# Patient Record
Sex: Female | Born: 1975 | Race: Black or African American | Hispanic: No | Marital: Single | Smoking: Former smoker
Health system: Southern US, Community
[De-identification: ages and names within clinical notes are randomized; demographics above are authoritative.]

## PROBLEM LIST (undated history)

## (undated) ENCOUNTER — Ambulatory Visit: Payer: MEDICAID

## (undated) DIAGNOSIS — C499 Malignant neoplasm of connective and soft tissue, unspecified: Secondary | ICD-10-CM

## (undated) DIAGNOSIS — F419 Anxiety disorder, unspecified: Secondary | ICD-10-CM

## (undated) DIAGNOSIS — I999 Unspecified disorder of circulatory system: Secondary | ICD-10-CM

## (undated) DIAGNOSIS — I7300 Raynaud's syndrome without gangrene: Secondary | ICD-10-CM

## (undated) DIAGNOSIS — I10 Essential (primary) hypertension: Secondary | ICD-10-CM

## (undated) DIAGNOSIS — F4310 Post-traumatic stress disorder, unspecified: Secondary | ICD-10-CM

## (undated) DIAGNOSIS — E039 Hypothyroidism, unspecified: Secondary | ICD-10-CM

## (undated) DIAGNOSIS — Z87410 Personal history of cervical dysplasia: Secondary | ICD-10-CM

## (undated) HISTORY — PX: LYMPH NODE BIOPSY: SHX201

## (undated) HISTORY — PX: APPENDECTOMY: SHX54

## (undated) HISTORY — DX: Personal history of cervical dysplasia: Z87.410

---

## 1998-11-12 ENCOUNTER — Other Ambulatory Visit: Admission: RE | Admit: 1998-11-12 | Discharge: 1998-11-12 | Payer: Self-pay | Admitting: *Deleted

## 1999-01-14 ENCOUNTER — Other Ambulatory Visit: Admission: RE | Admit: 1999-01-14 | Discharge: 1999-01-14 | Payer: Self-pay | Admitting: *Deleted

## 1999-01-17 ENCOUNTER — Other Ambulatory Visit: Admission: RE | Admit: 1999-01-17 | Discharge: 1999-01-17 | Payer: Self-pay | Admitting: *Deleted

## 1999-01-17 ENCOUNTER — Encounter (INDEPENDENT_AMBULATORY_CARE_PROVIDER_SITE_OTHER): Payer: Self-pay | Admitting: *Deleted

## 1999-02-25 ENCOUNTER — Other Ambulatory Visit: Admission: RE | Admit: 1999-02-25 | Discharge: 1999-02-25 | Payer: Self-pay | Admitting: *Deleted

## 1999-02-25 ENCOUNTER — Encounter (INDEPENDENT_AMBULATORY_CARE_PROVIDER_SITE_OTHER): Payer: Self-pay | Admitting: Specialist

## 1999-11-02 ENCOUNTER — Other Ambulatory Visit: Admission: RE | Admit: 1999-11-02 | Discharge: 1999-11-02 | Payer: Self-pay | Admitting: Obstetrics and Gynecology

## 2003-05-31 ENCOUNTER — Emergency Department (HOSPITAL_COMMUNITY): Admission: AD | Admit: 2003-05-31 | Discharge: 2003-05-31 | Payer: Self-pay | Admitting: Family Medicine

## 2004-07-20 ENCOUNTER — Emergency Department (HOSPITAL_COMMUNITY): Admission: EM | Admit: 2004-07-20 | Discharge: 2004-07-20 | Payer: Self-pay | Admitting: Family Medicine

## 2004-07-20 ENCOUNTER — Ambulatory Visit (HOSPITAL_COMMUNITY): Admission: RE | Admit: 2004-07-20 | Discharge: 2004-07-20 | Payer: Self-pay | Admitting: Family Medicine

## 2004-07-20 IMAGING — US US OB COMP LESS 14 WK
1 series · 14 of 28 positions shown · non-contrast
Comparison: none

CLINICAL DATA: Pelvic pain/positive pregnancy test.
 OB ULTRASOUND LESS THAN 14 WEEKS WITH TRANSVAGINAL SCANNING:
 There is an intrauterine gestational sac consistent with a 5 week 5 day IUP.  Yolk sac is identified.  No embryo is visualized at this time.  Ovaries normal.  No adnexal masses.  No free fluid in the pelvis.

[Series 1: unknown · 0.32mm/px · 14 of 59 slices shown]
[im 3/59]
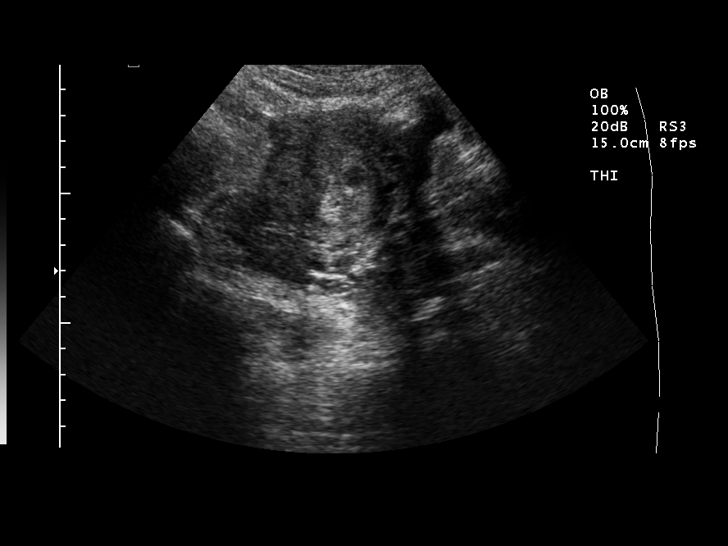
[im 7/59]
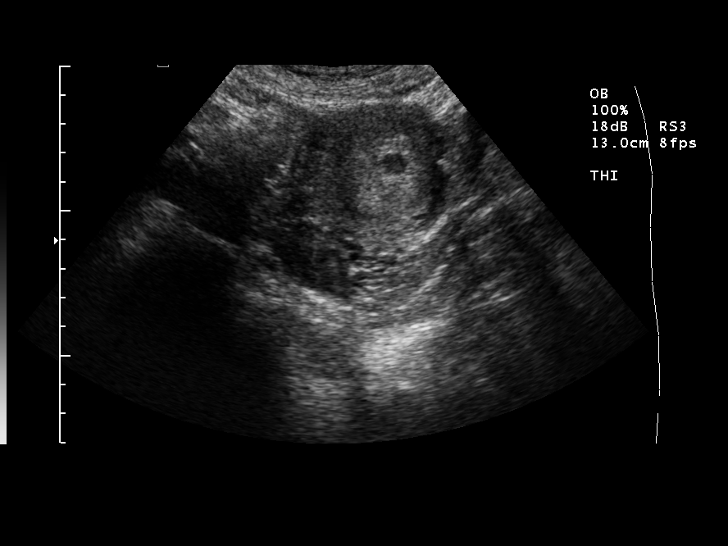
[im 11/59]
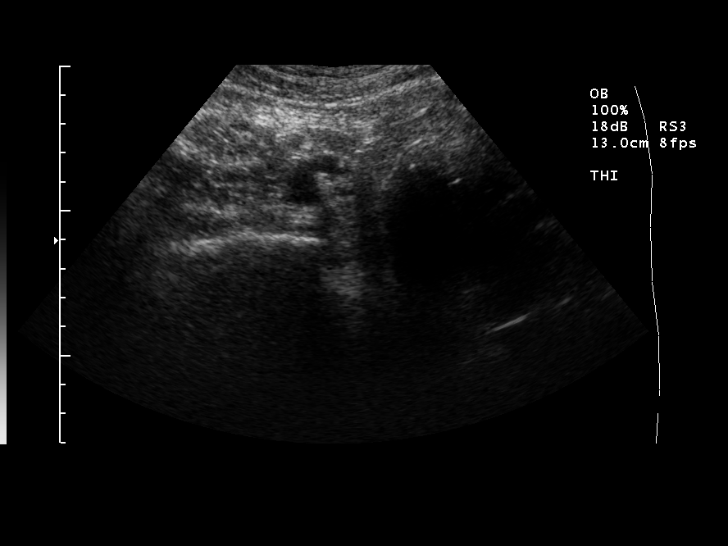
[im 16/59]
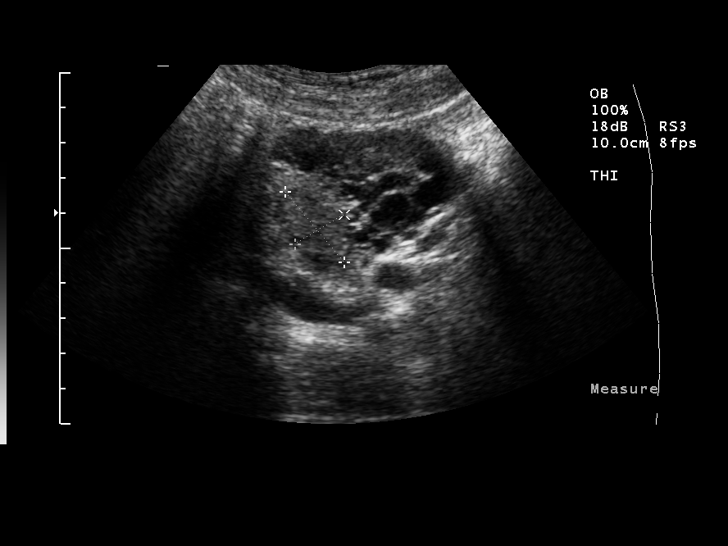
[im 20/59]
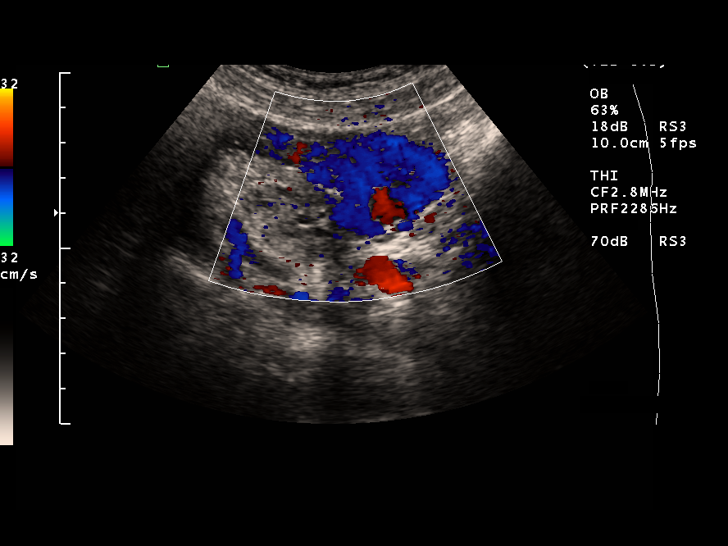
[im 24/59]
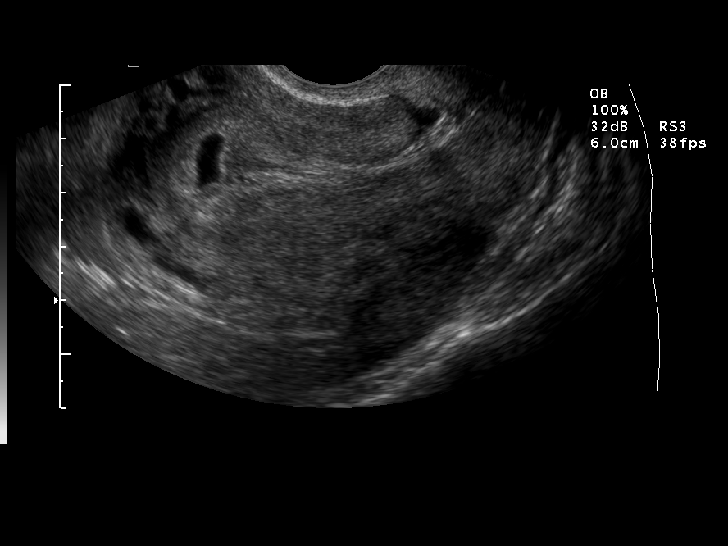
[im 28/59]
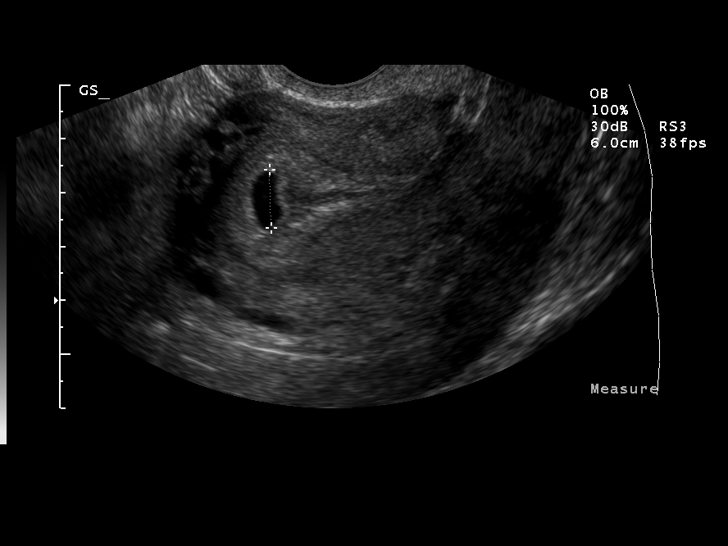
[im 33/59]
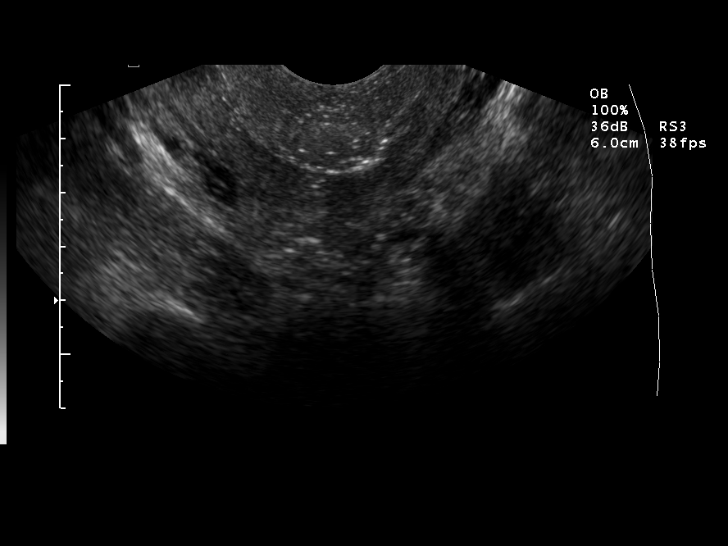
[im 37/59]
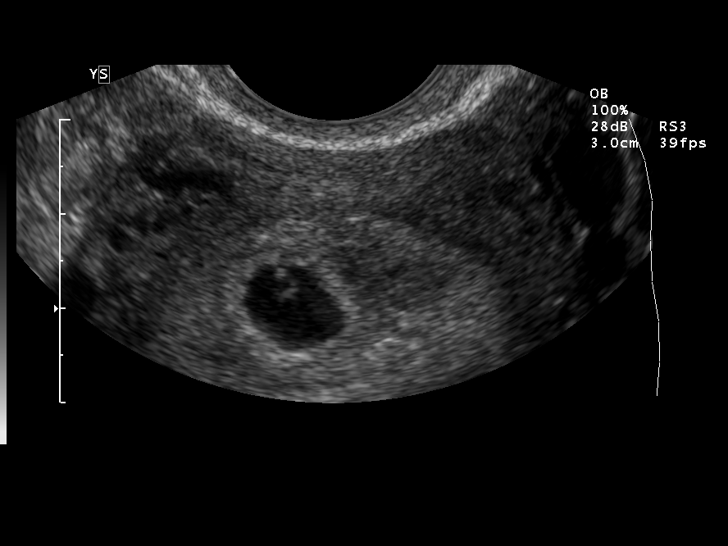
[im 41/59]
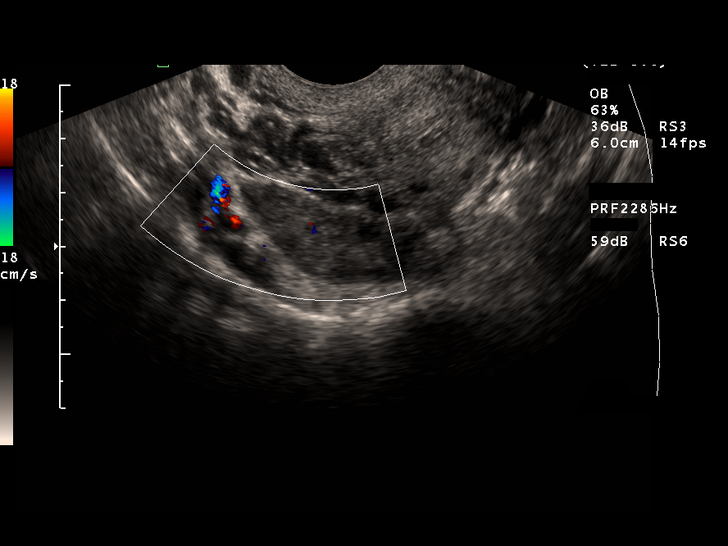
[im 46/59]
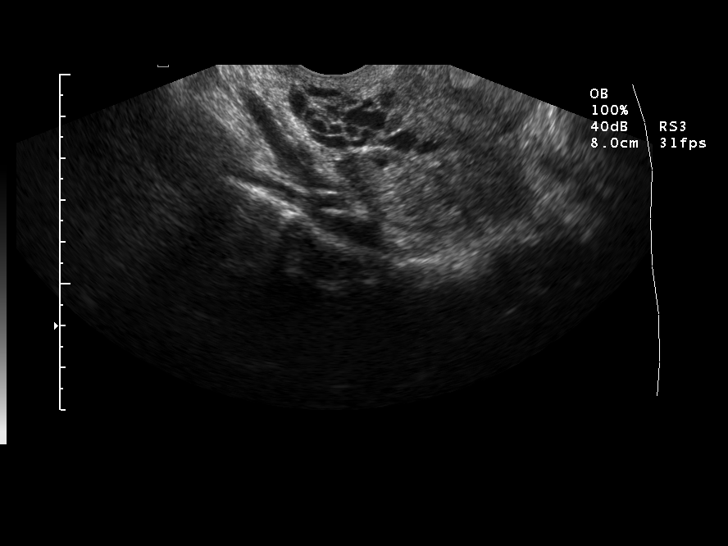
[im 50/59]
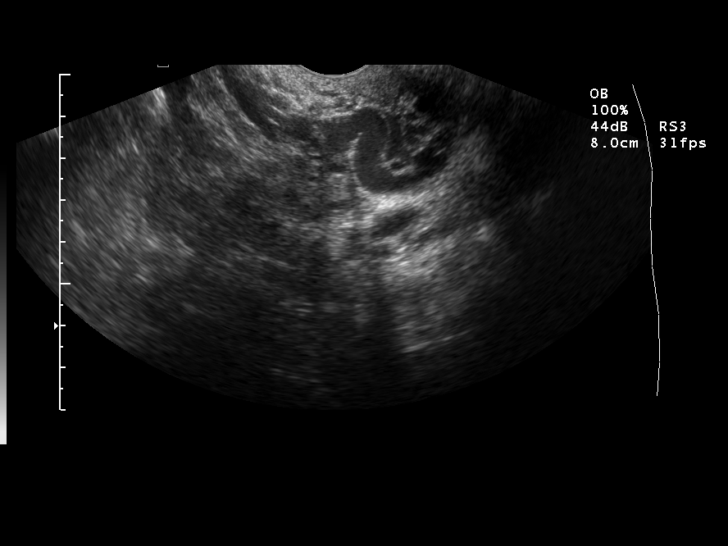
[im 54/59]
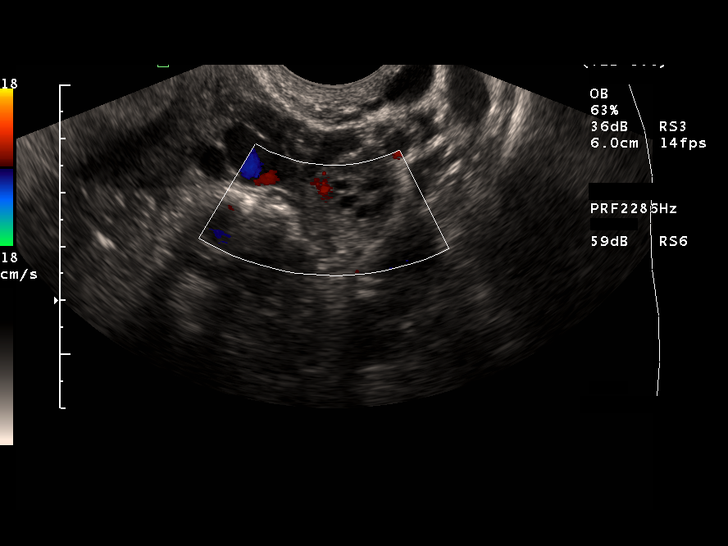
[im 59/59]
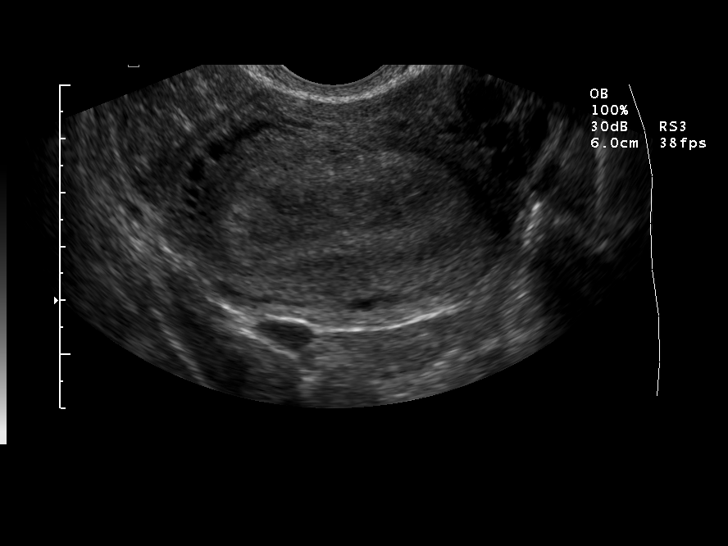

[14 of 28 positions shown; findings below may reference images not displayed]

IMPRESSION: Intrauterine gestational sac.  Gestational age estimated at 5 weeks 5 days based on sac size, but at this time, there is no embryo identified.  See report.

## 2004-07-26 ENCOUNTER — Inpatient Hospital Stay (HOSPITAL_COMMUNITY): Admission: AD | Admit: 2004-07-26 | Discharge: 2004-07-26 | Payer: Self-pay

## 2004-07-26 IMAGING — US US OB TRANSVAGINAL MODIFY
1 series · 14 of 28 positions shown · non-contrast
Comparison: [DATE].

CLINICAL DATA: Early pregnancy with heavy vaginal bleeding and cramping.

COMPLETE OBSTETRICAL ULTRASOUND LESS THAN 14 WEEKS AND TRANSVAGINAL OBSTETRICAL
ULTRASOUND

[Series 1: us ob transvaginal modify · 0.33mm/px · 44 acquisitions, 14 frames shown]
[im 2/44]
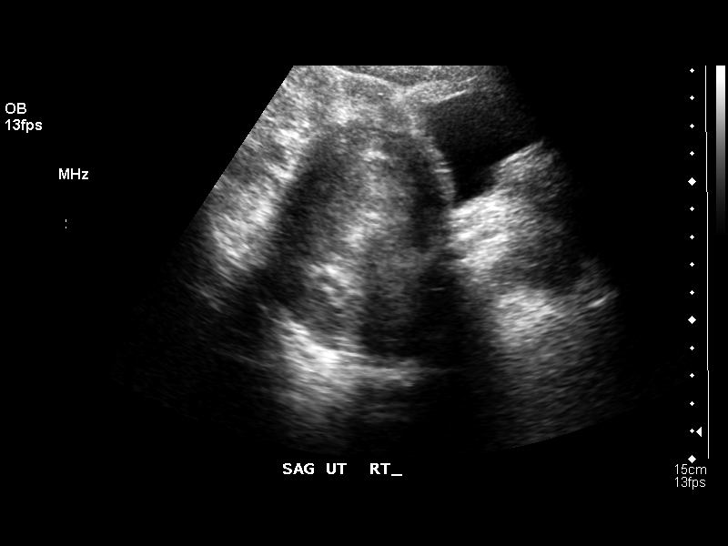
[im 5/44]
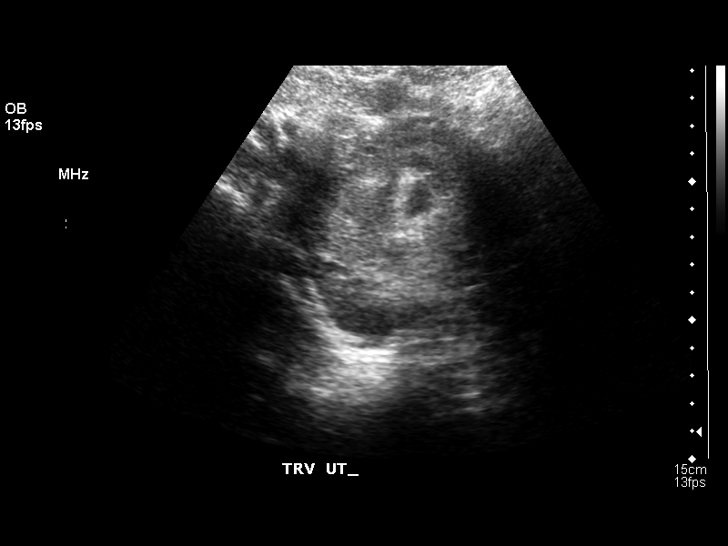
[im 8/44]
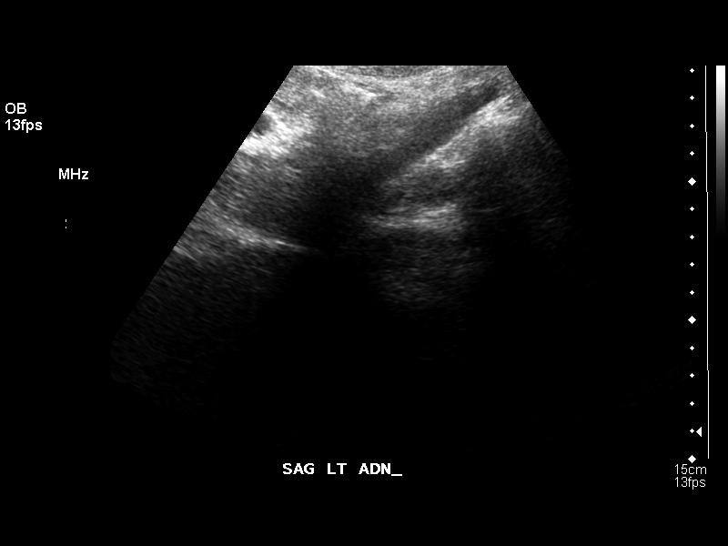
[im 12/44]
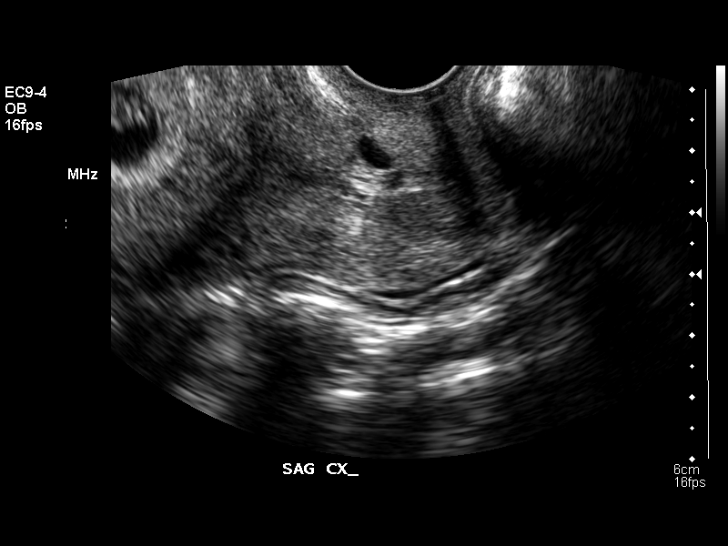
[im 15/44]
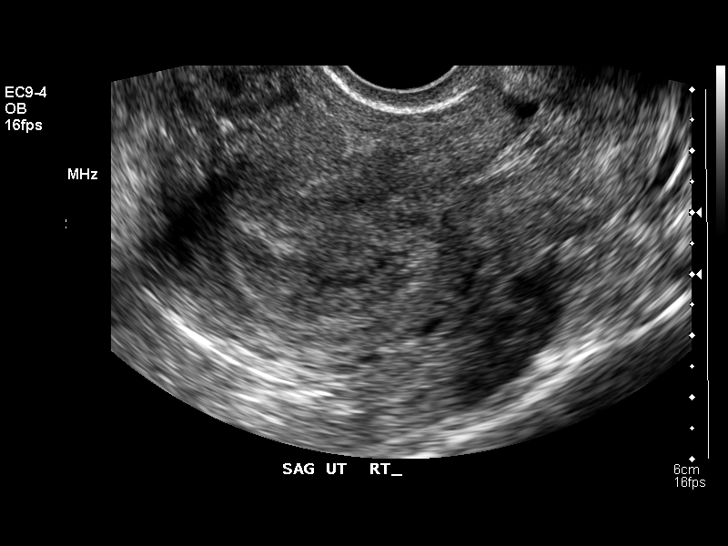
[im 18/44]
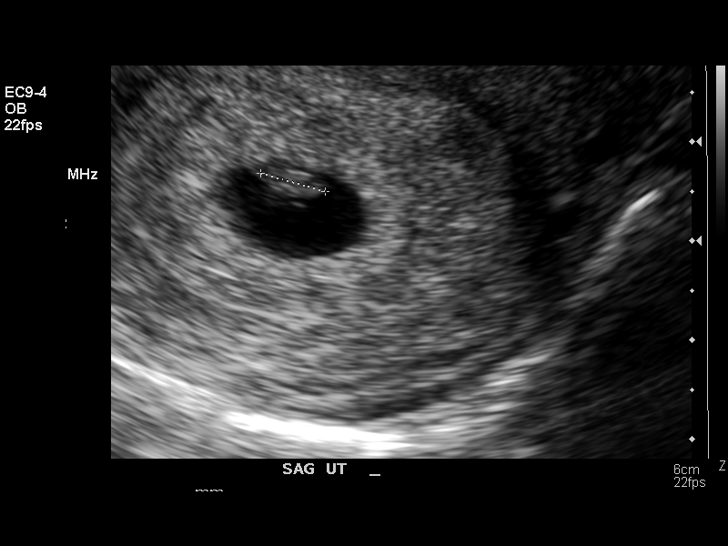
[im 21/44]
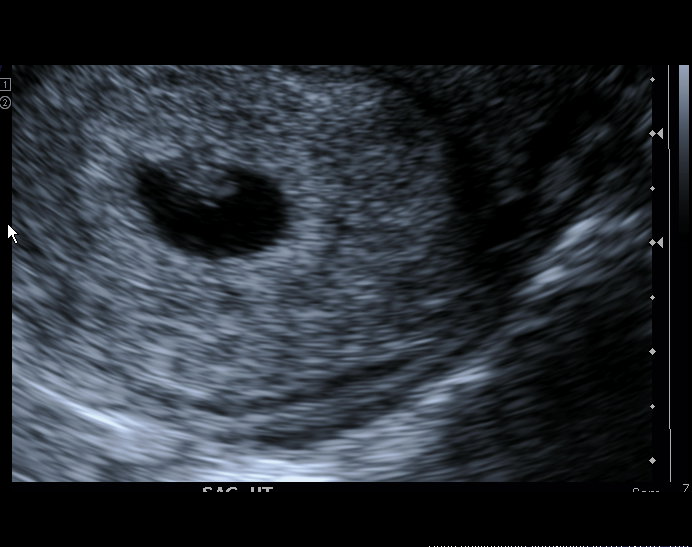
[im 24/44]
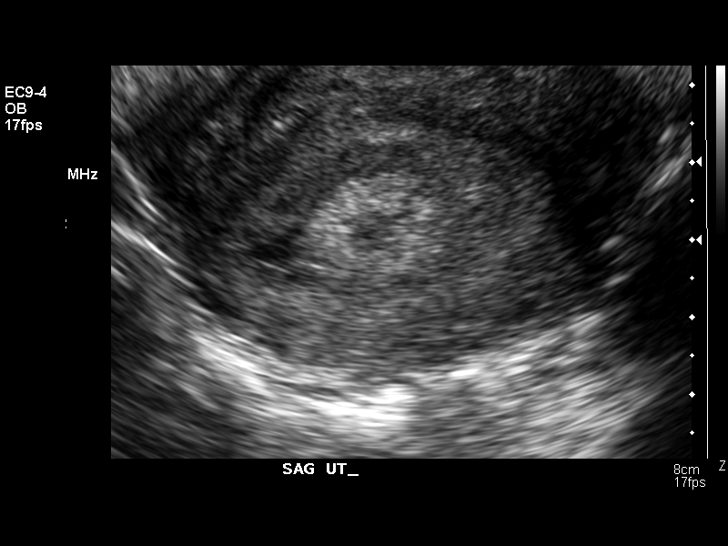
[im 28/44]
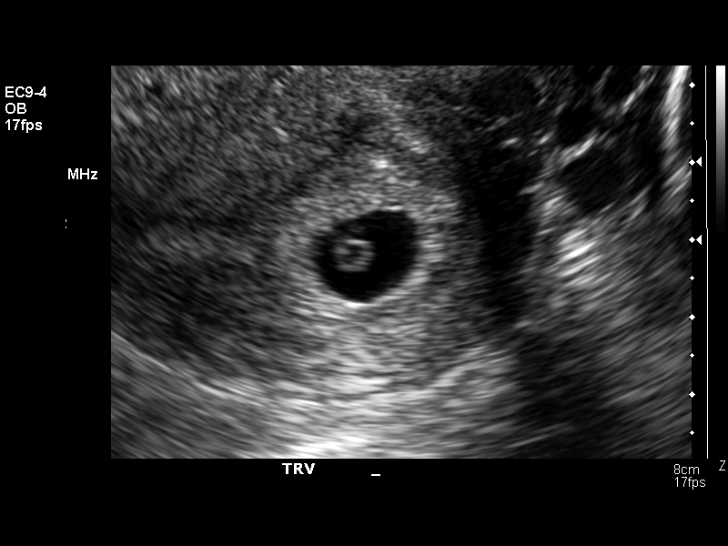
[im 31/44]
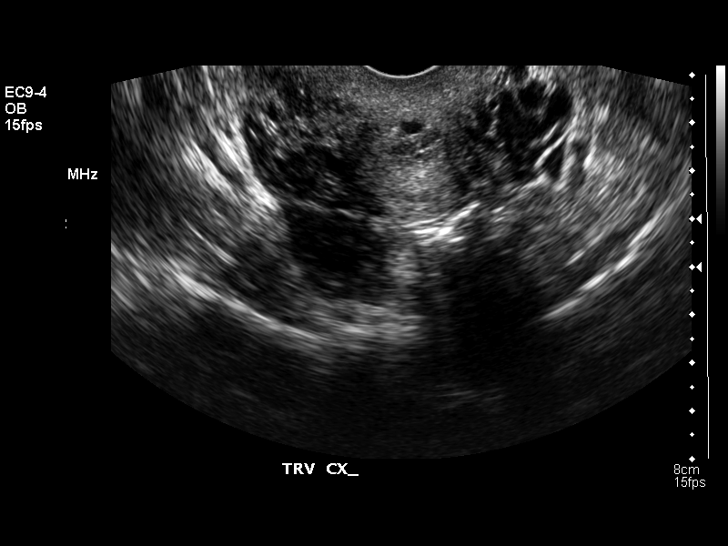
[im 34/44]
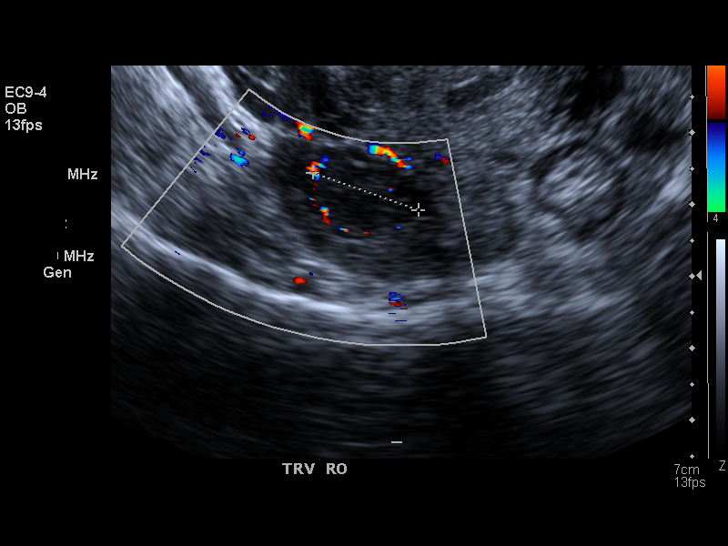
[im 37/44]
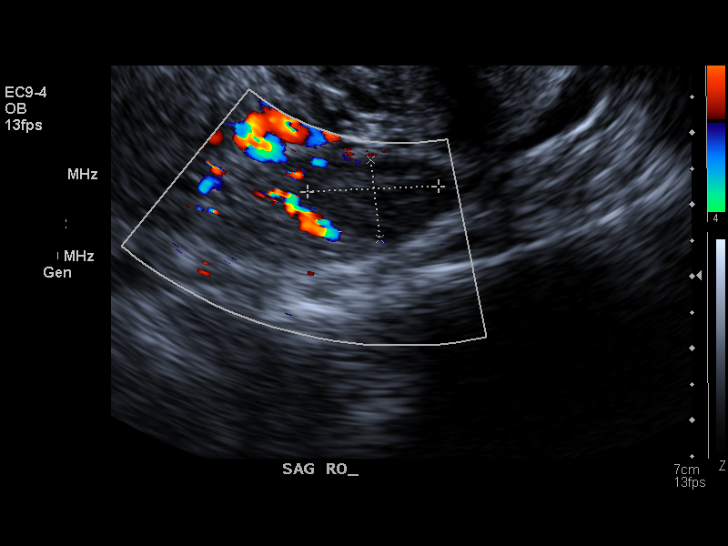
[im 40/44]
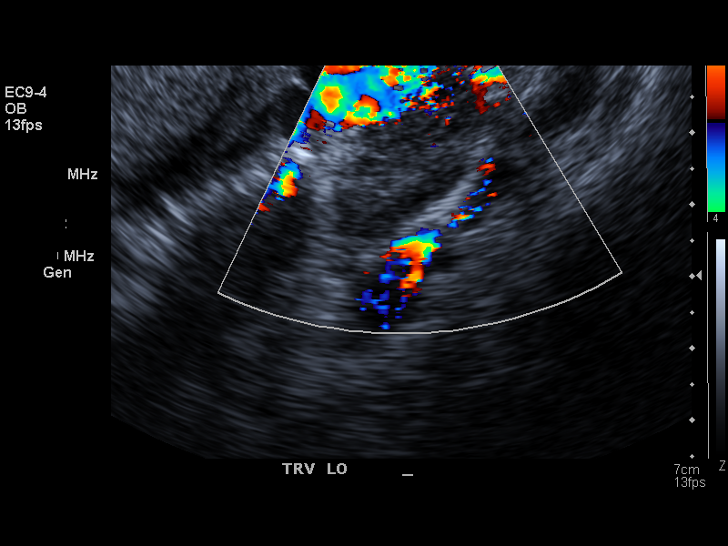
[im 44/44]
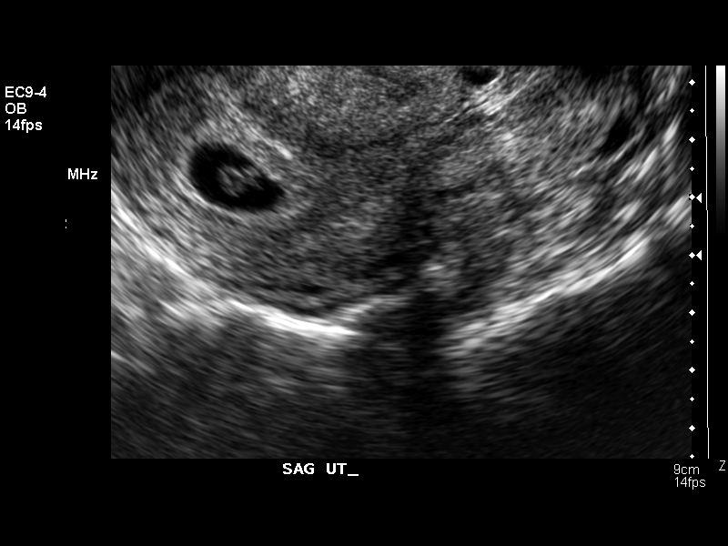

[14 of 28 positions shown; findings below may reference images not displayed]

FINDINGS: Single live intrauterine gestation with a crown-rump length of 7 mm,
giving an estimated gestational age of 6 weeks and 4 days. Normal fetal cardiac
activity with a fetal heart rate of 141 beats per minute. Normal appearing yolk
sac. Small subchorionic hemorrhage measuring 10 x 6 x 5 mm in maximum
dimensions. 1.8 x 1.6 x 1.1 cm oval hypoechoic area within the right ovary
without internal blood flow with color Doppler. Normal appearing left ovary.
Small amount of free peritoneal fluid.

IMPRESSION

1. Single live intrauterine gestation with an estimated gestational age of 6
weeks and 4 days.

2. Small subchorionic hemorrhage.

3. 1.8 cm resolving right ovarian corpus luteum cyst.

4. Small amount of free peritoneal fluid.

## 2004-09-29 ENCOUNTER — Other Ambulatory Visit: Admission: RE | Admit: 2004-09-29 | Discharge: 2004-09-29 | Payer: Self-pay | Admitting: Obstetrics and Gynecology

## 2005-02-01 ENCOUNTER — Inpatient Hospital Stay (HOSPITAL_COMMUNITY): Admission: AD | Admit: 2005-02-01 | Discharge: 2005-02-01 | Payer: Self-pay | Admitting: Obstetrics and Gynecology

## 2005-02-23 ENCOUNTER — Inpatient Hospital Stay (HOSPITAL_COMMUNITY): Admission: AD | Admit: 2005-02-23 | Discharge: 2005-02-23 | Payer: Self-pay | Admitting: Obstetrics and Gynecology

## 2005-03-05 ENCOUNTER — Inpatient Hospital Stay (HOSPITAL_COMMUNITY): Admission: AD | Admit: 2005-03-05 | Discharge: 2005-03-09 | Payer: Self-pay | Admitting: Obstetrics and Gynecology

## 2005-03-05 ENCOUNTER — Encounter (INDEPENDENT_AMBULATORY_CARE_PROVIDER_SITE_OTHER): Payer: Self-pay | Admitting: *Deleted

## 2006-04-11 ENCOUNTER — Emergency Department (HOSPITAL_COMMUNITY): Admission: EM | Admit: 2006-04-11 | Discharge: 2006-04-11 | Payer: Self-pay | Admitting: Emergency Medicine

## 2006-04-19 IMAGING — CR DG HAND COMPLETE 3+V*R*
3 series · 3 of 3 positions shown · non-contrast
Comparison: None.

CLINICAL DATA: Cut right pointer finger on glass.  Pain 3rd joint.  
 RIGHT HAND - 3 VIEW:

[view not recorded (1 of 3)]
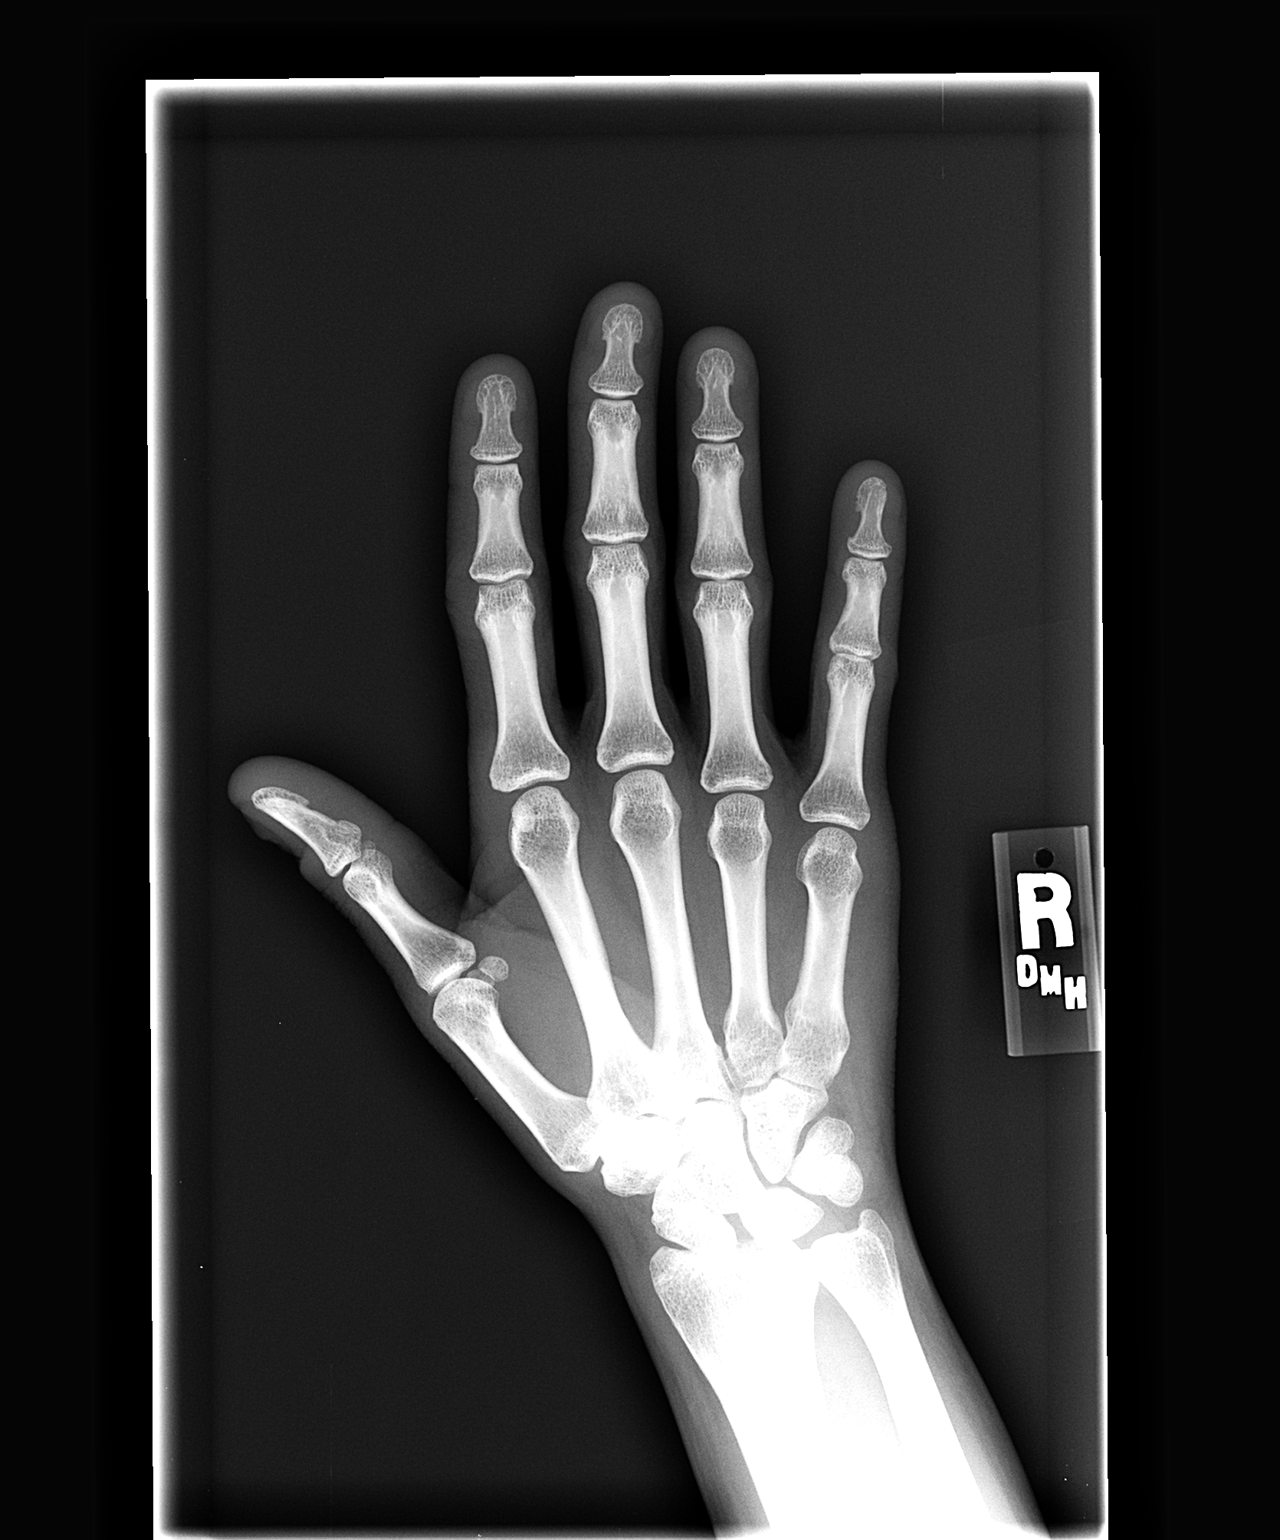

[view not recorded (2 of 3)]
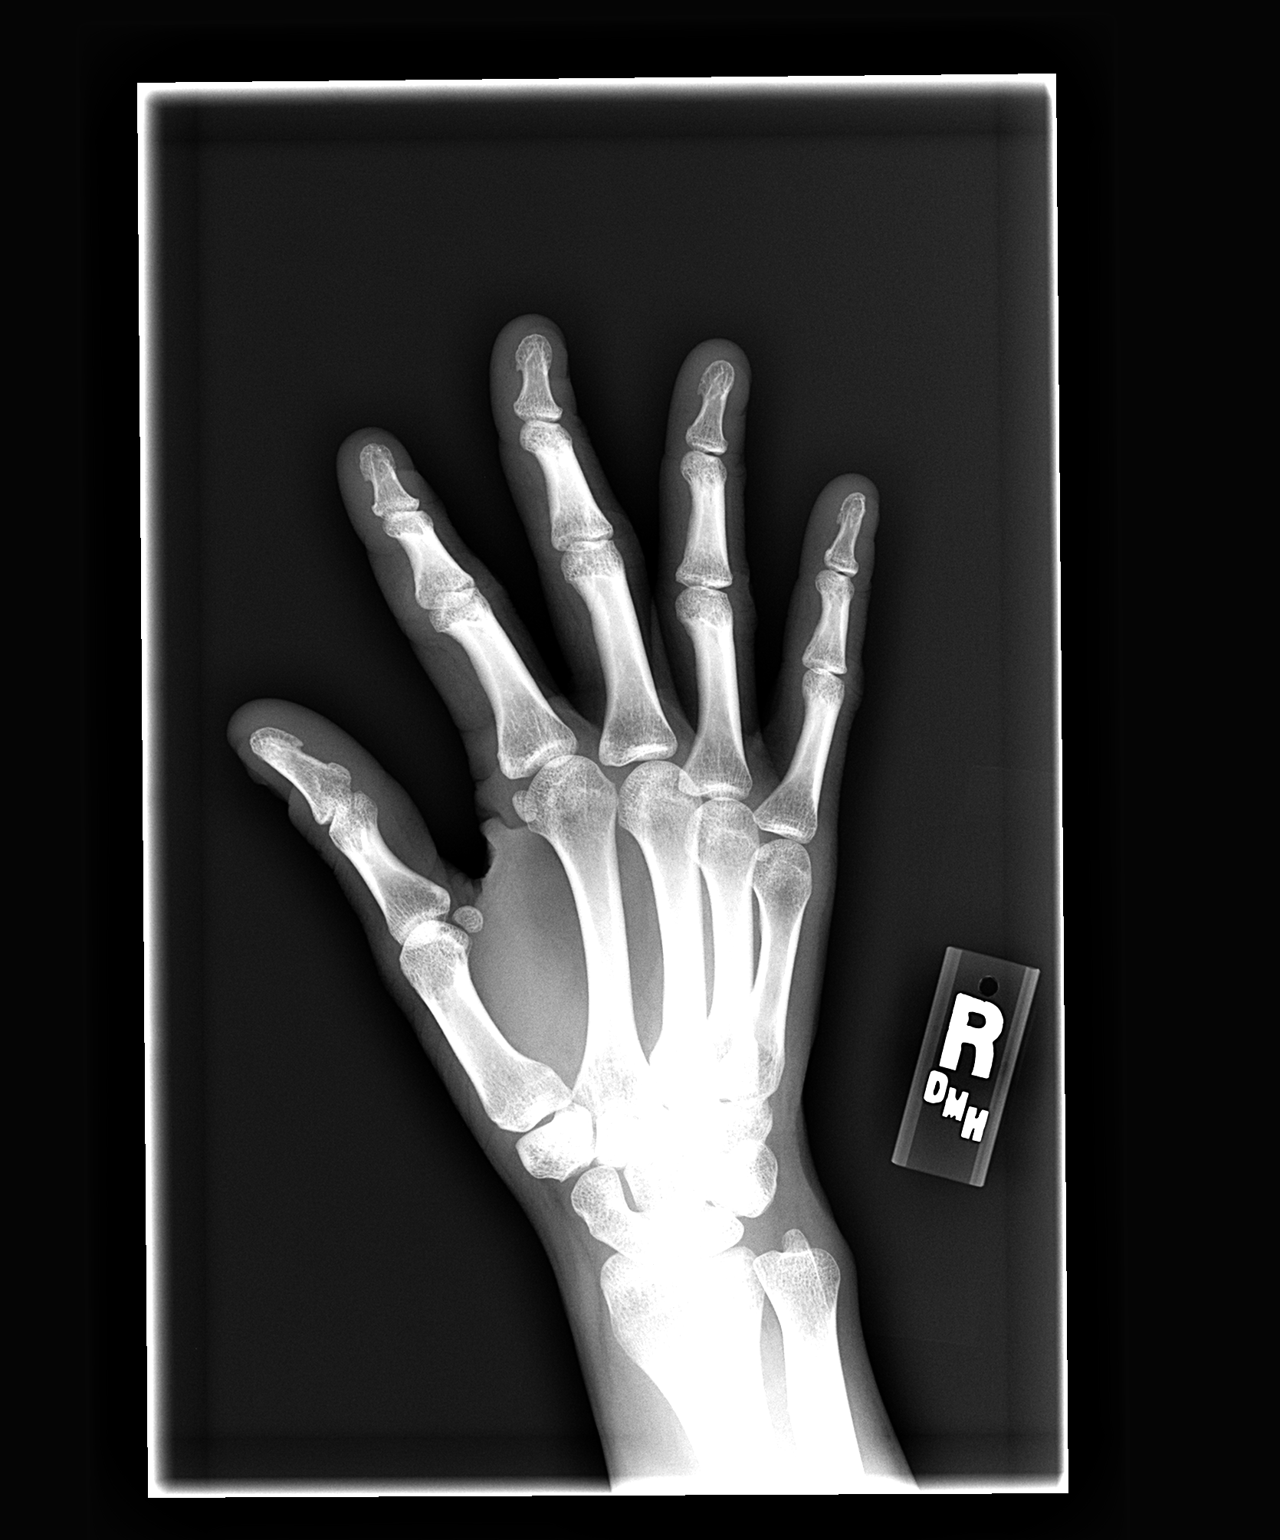

[view not recorded (3 of 3)]
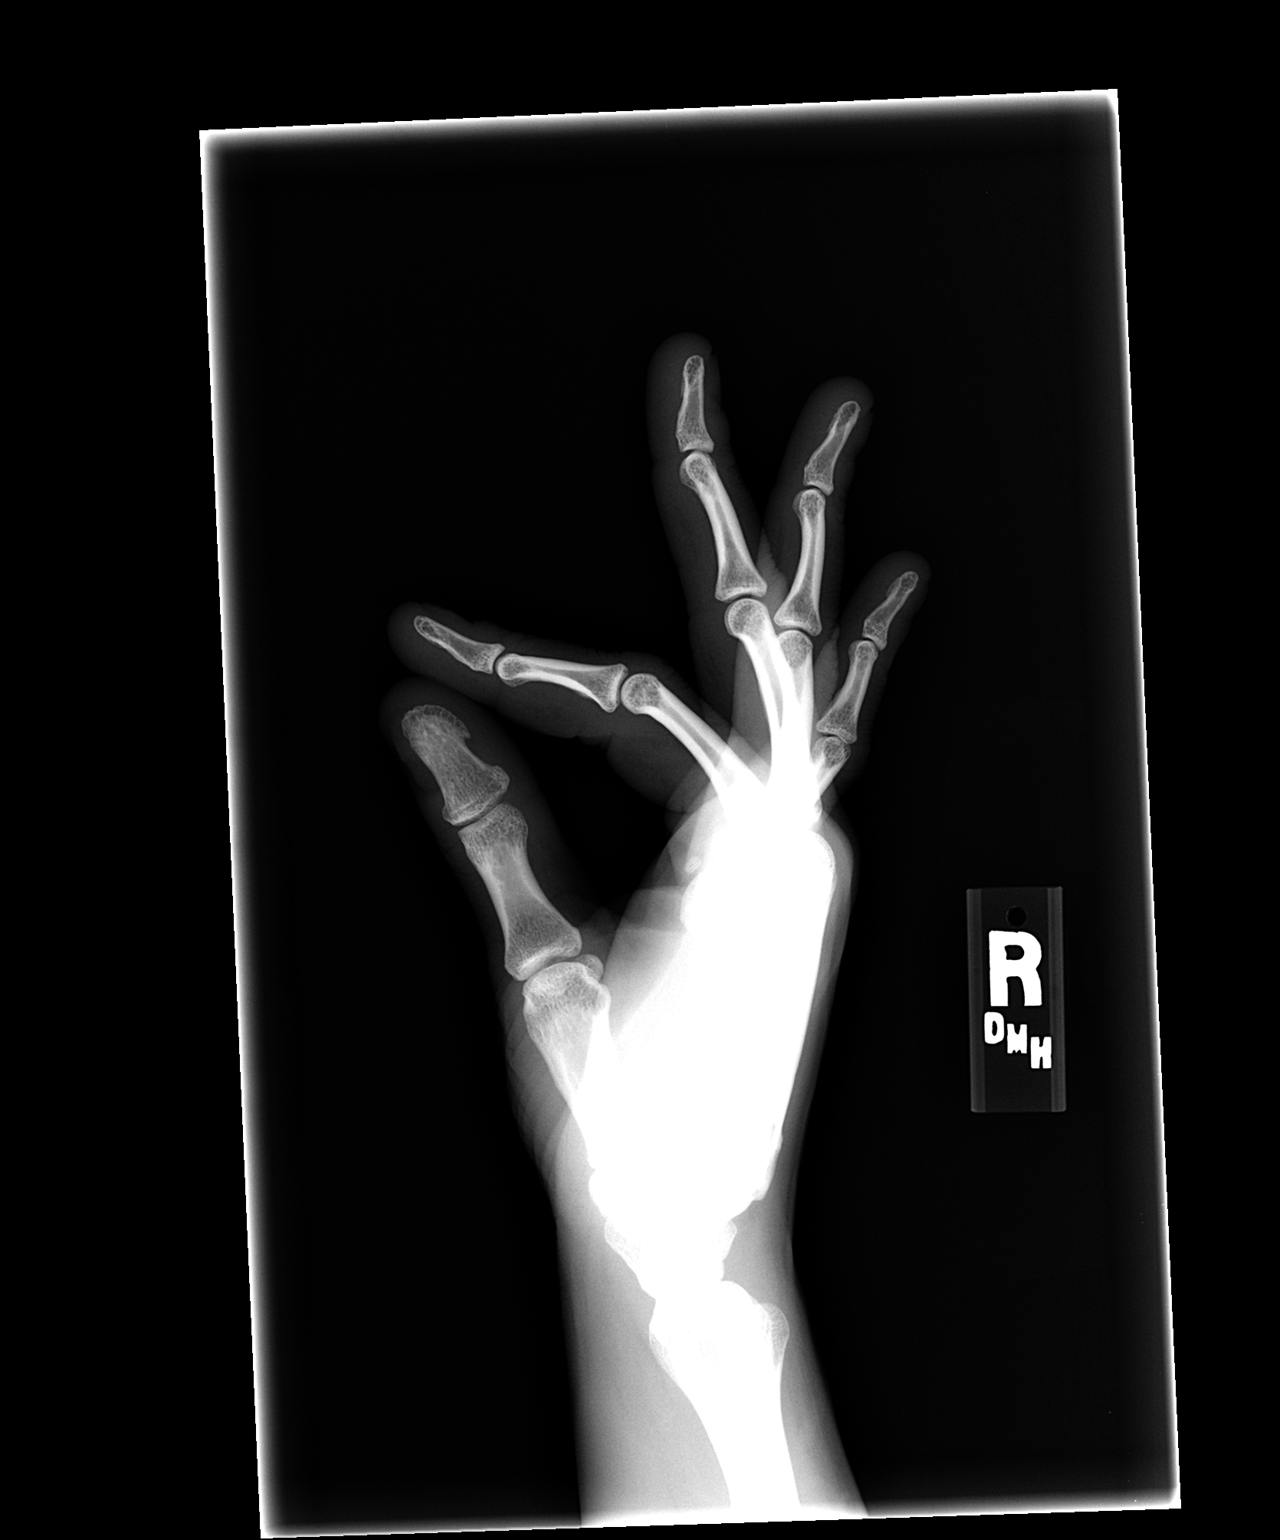

[3 of 3 positions shown; findings below may reference images not displayed]

FINDINGS: There are no radiopaque foreign bodies identified.  The osseous structures are intact.  No fracture or dislocation is identified.
IMPRESSION: 1.  No acute osseous abnormalities. 
 2.  No radiopaque foreign body.

## 2006-05-19 ENCOUNTER — Inpatient Hospital Stay (HOSPITAL_COMMUNITY): Admission: AD | Admit: 2006-05-19 | Discharge: 2006-05-19 | Payer: Self-pay | Admitting: Gynecology

## 2006-05-19 IMAGING — US US TRANSVAGINAL NON-OB
1 series · 13 of 25 positions shown · non-contrast
Comparison: none

CLINICAL DATA: Heavy vaginal bleeding with clots and right lower quadrant and back pain.
TRANSABDOMINAL AND TRANSVAGINAL PELVIC ULTRASOUND ? [DATE]:
TECHNIQUE: Both transabdominal and transvaginal ultrasound examinations of the pelvis were performed including evaluation of the uterus, ovaries, adnexal regions, and pelvic cul-de-sac.

[Series 1: us transvaginal non-ob · 0.37mm/px · 13 of 77 slices shown]
[im 1/77]
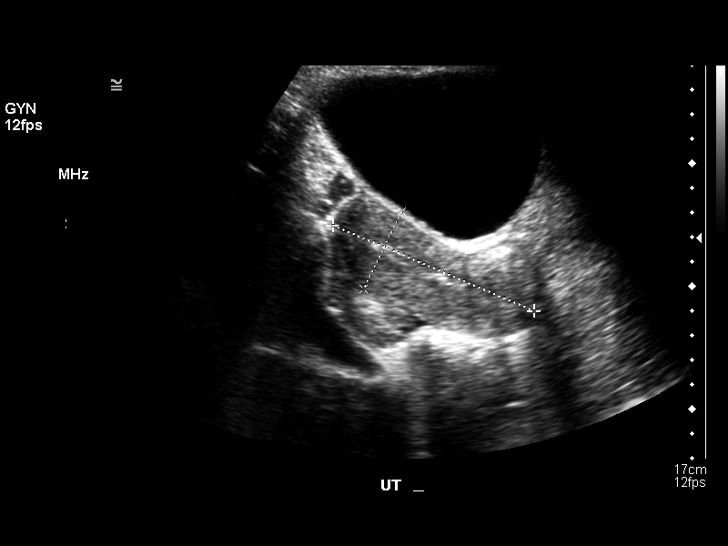
[im 7/77]
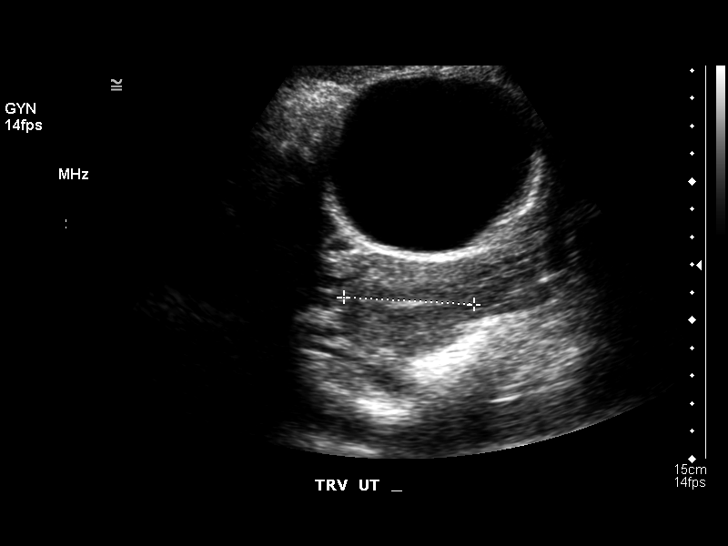
[im 13/77]
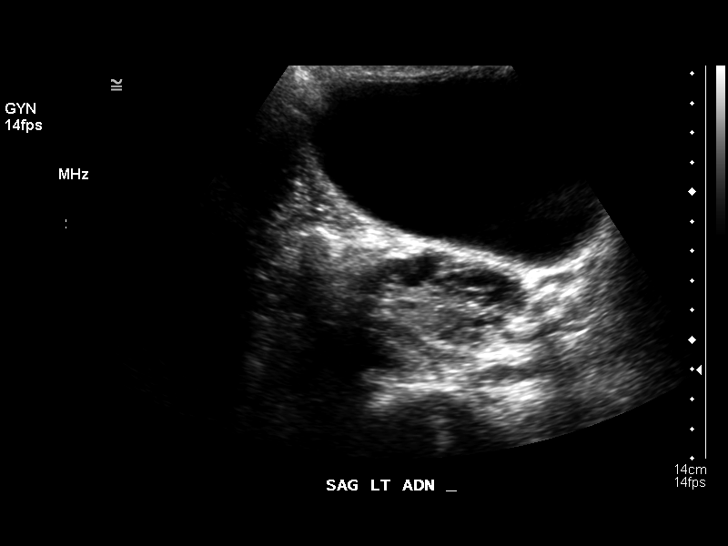
[im 20/77]
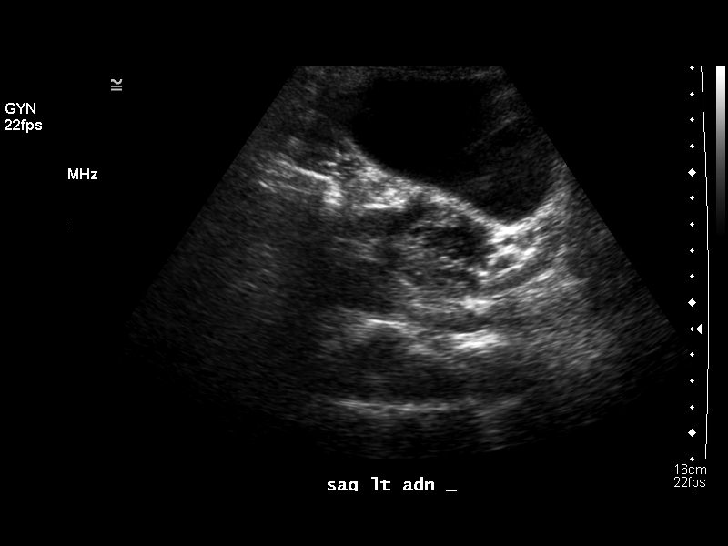
[im 26/77]
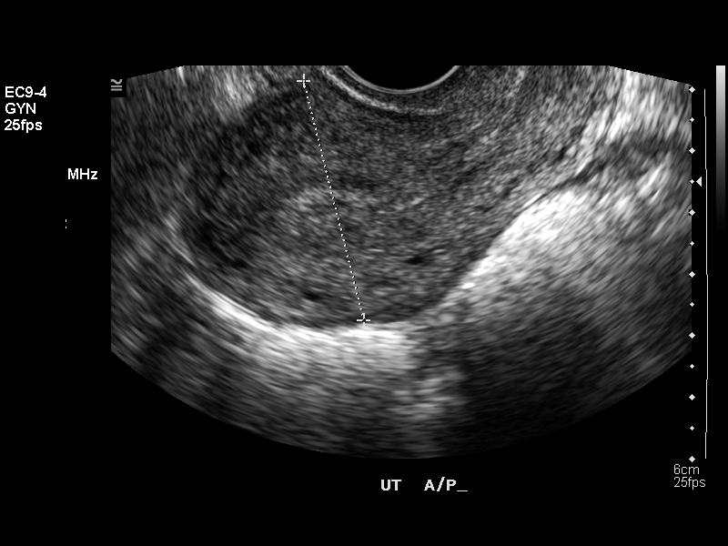
[im 32/77]
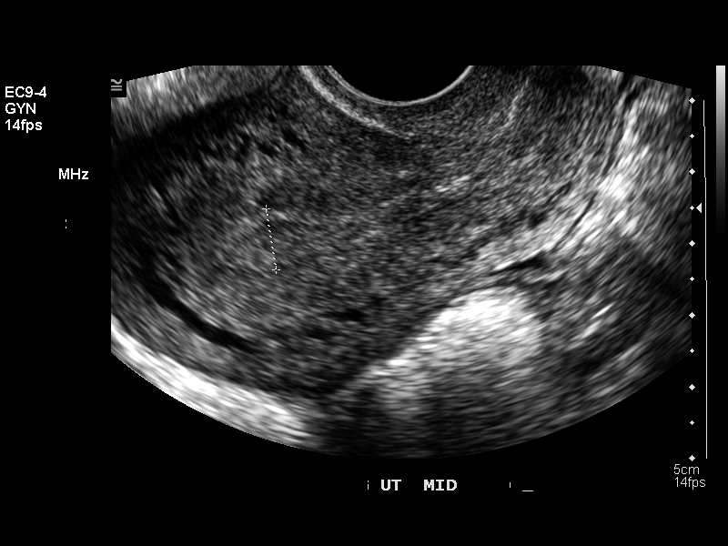
[im 39/77]
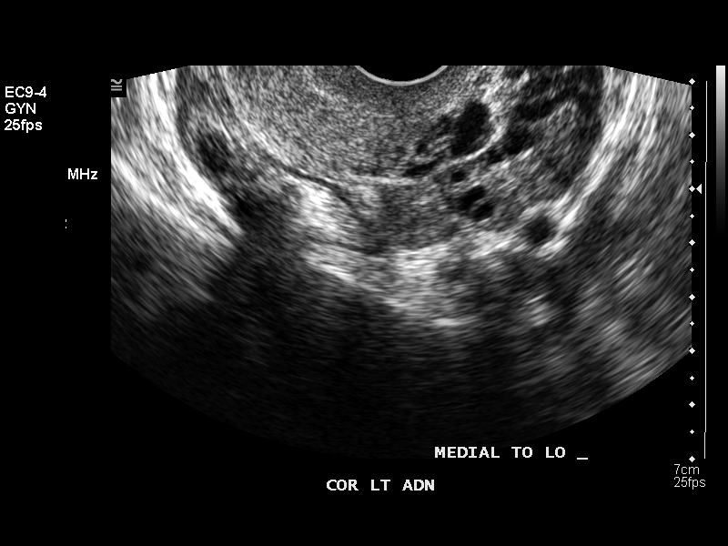
[im 45/77]
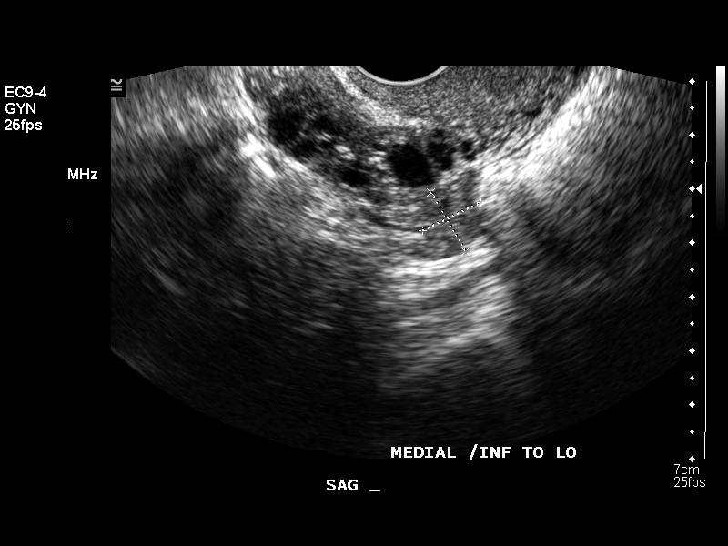
[im 51/77]
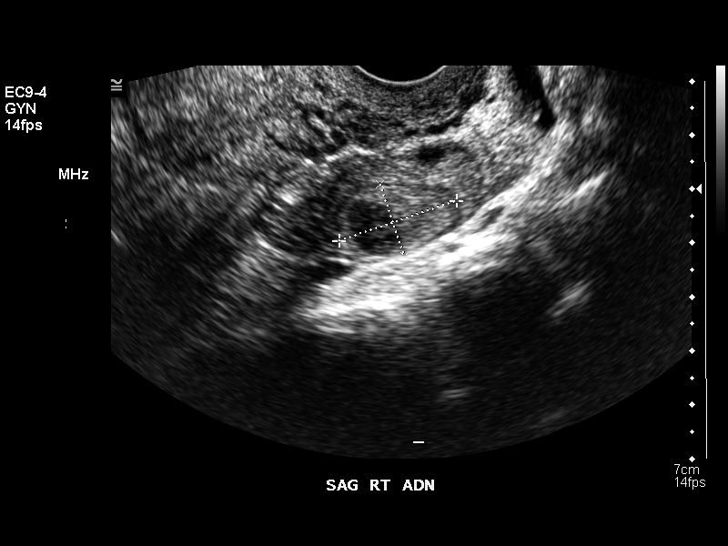
[im 58/77]
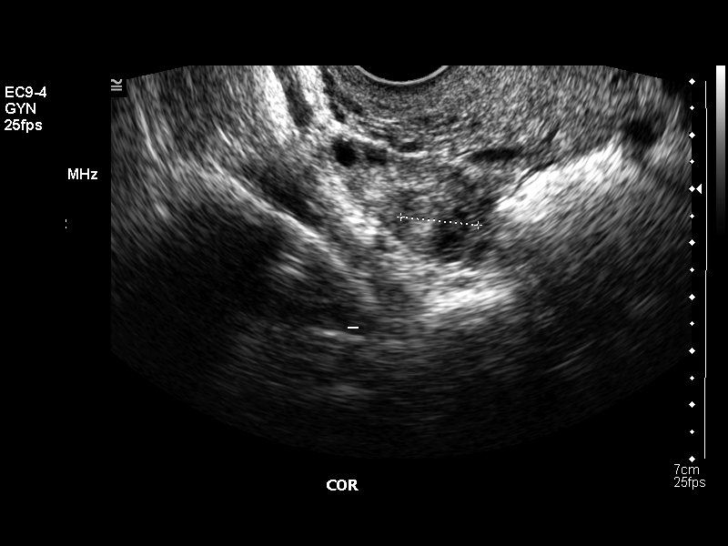
[im 64/77]
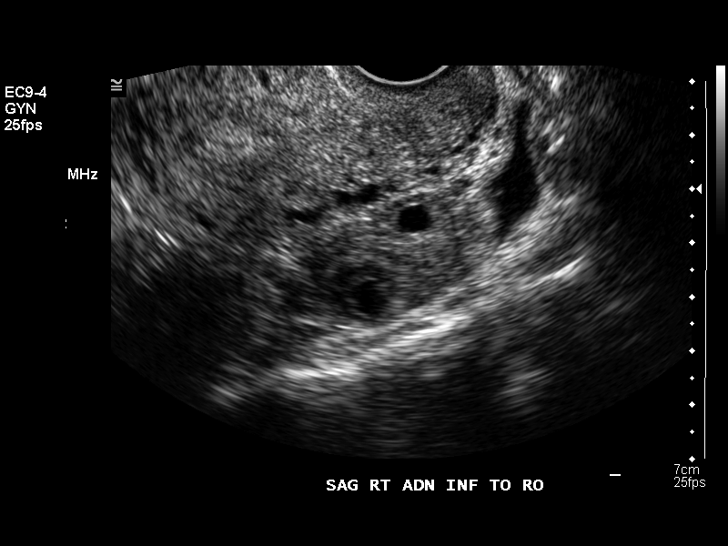
[im 70/77]
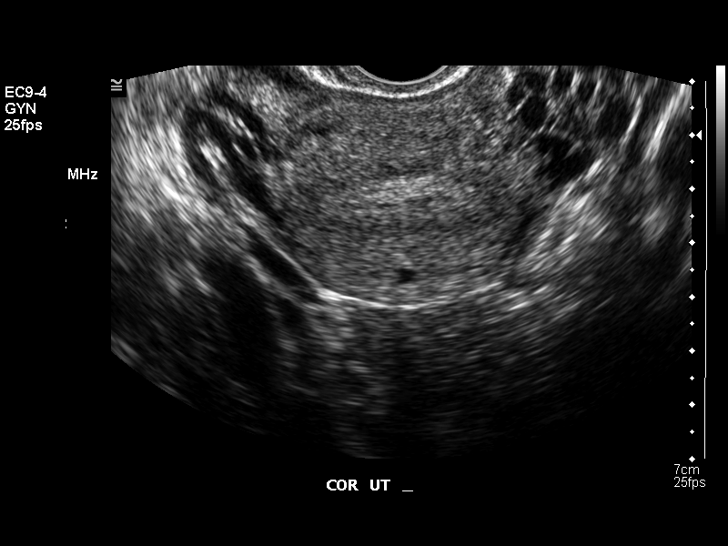
[im 77/77]
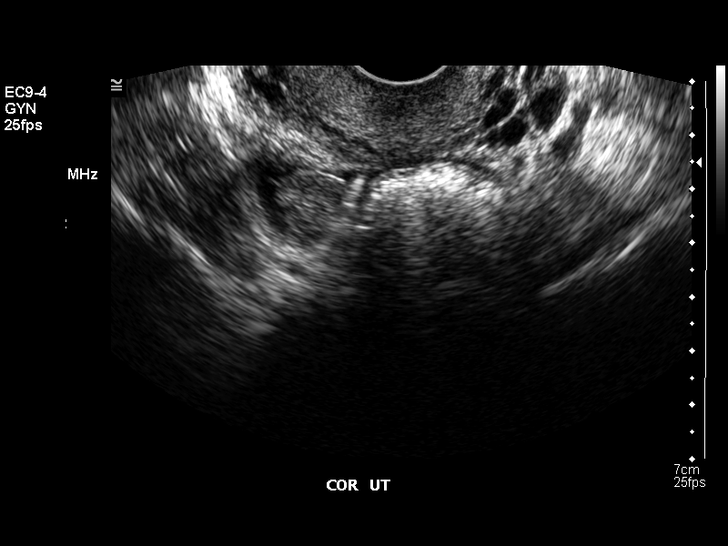

[13 of 25 positions shown; findings below may reference images not displayed]

FINDINGS: The uterus measures 7.8 cm in length x 4.0 x 4.5 cm in transverse dimensions.  An inhomogeneous endometrial complex is noted and could be due to the presence of blood breakdown products.  The endometrial complex is 8.6 mm.  
The right ovary measures 1.8 x 3.3 x 3.8 cm and the left ovary measures 1.6 x 2.8 x 3.0 cm.  The right ovary contains a complex cyst measuring 1.4 x 1.4 x 2.3 cm.  A small to moderate amount of free fluid is noted.  There is a soft tissue area medial to the right ovary measuring 1 x 1.2 x 1.6 cm and also a soft tissue area inferomedial to the left ovary measuring 1.2 x 1.3 x 1.5 cm.  No significant color flow was visualized within these areas.
IMPRESSION: Inhomogeneous endometrial complex, the etiology of which is uncertain.  This may represent blood breakdown products or potentially products of conception. 
Complex cyst in the right ovary.
Indeterminate soft tissue areas adjacent to the right and left ovaries of undetermined etiology.  Ectopic pregnancy cannot be excluded.  No evidence of a well-defined intrauterine or extrauterine gestational sac.  Small to moderate amount of free fluid.

## 2006-07-09 ENCOUNTER — Emergency Department (HOSPITAL_COMMUNITY): Admission: EM | Admit: 2006-07-09 | Discharge: 2006-07-10 | Payer: Self-pay | Admitting: Emergency Medicine

## 2006-07-10 IMAGING — CT CT ABDOMEN W/ CM
2 of 5 series · 17 of 46 positions shown, 19 images · IV contrast (omnipaque)
Comparison: None

ABDOMEN CT WITH CONTRAST

CLINICAL DATA: Abdominal pain, cramping
TECHNIQUE: Multidetector CT imaging of the abdomen and pelvis was performed
following the standard protocol during bolus administration of intravenous
contrast.

Contrast:  100 cc Omnipaque 300

[Series 2: abd_pel 5.0 b40f st · axial · 0.59mm/px · z∈[+788,+1144]mm · 14 of 81 slices shown, 16 images]
[im 5/81  soft-tissue]
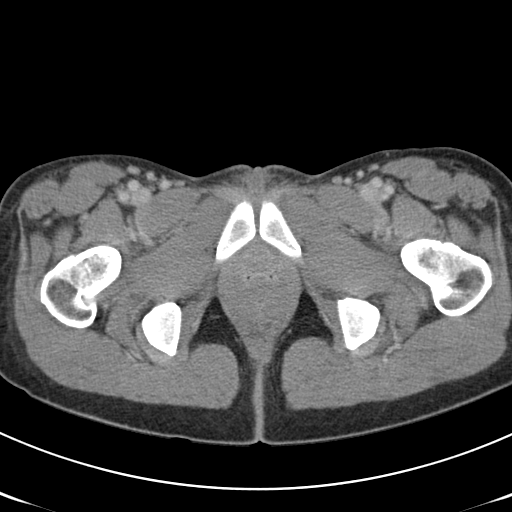
[im 5/81  bone]
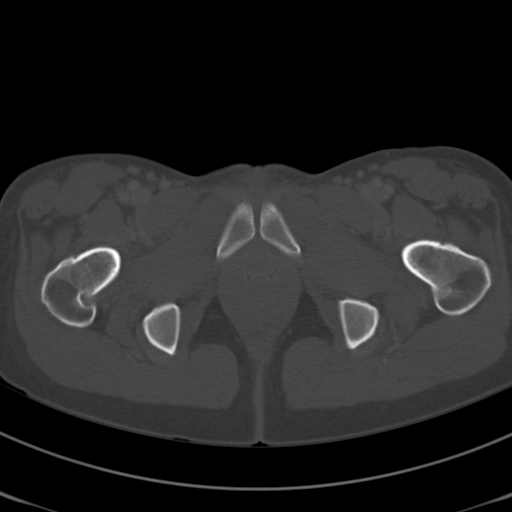
[im 9/81  soft-tissue]
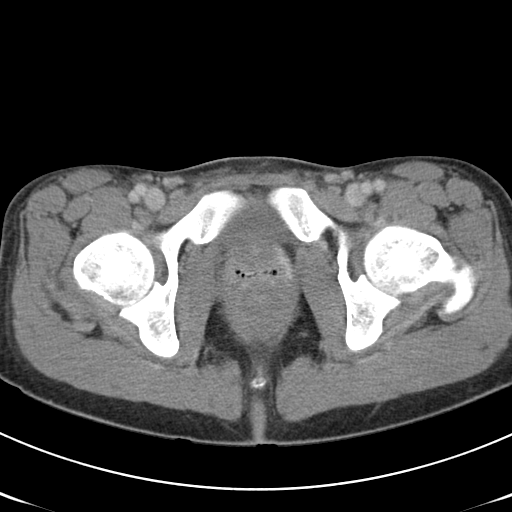
[im 17/81  soft-tissue]
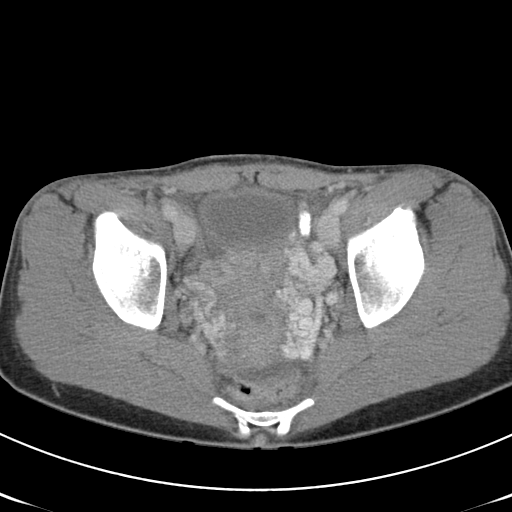
[im 22/81  soft-tissue]
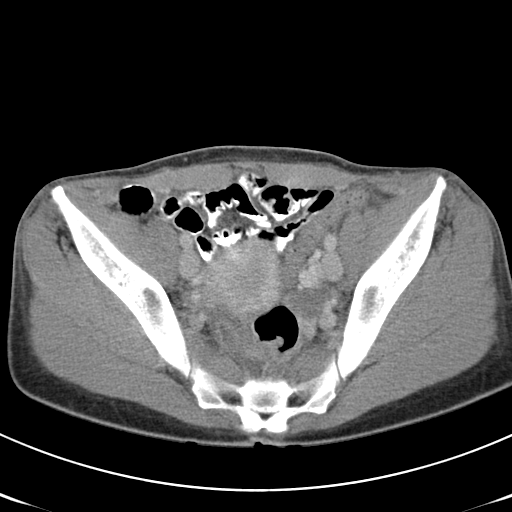
[im 26/81  soft-tissue]
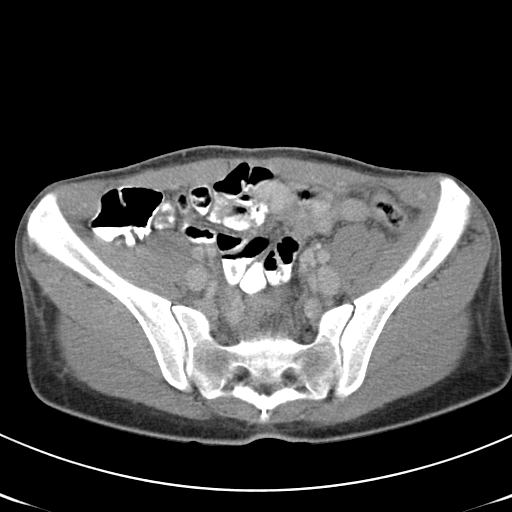
[im 34/81  soft-tissue]
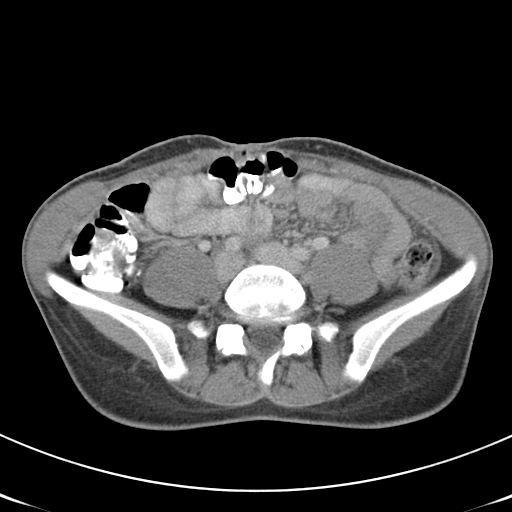
[im 38/81  soft-tissue]
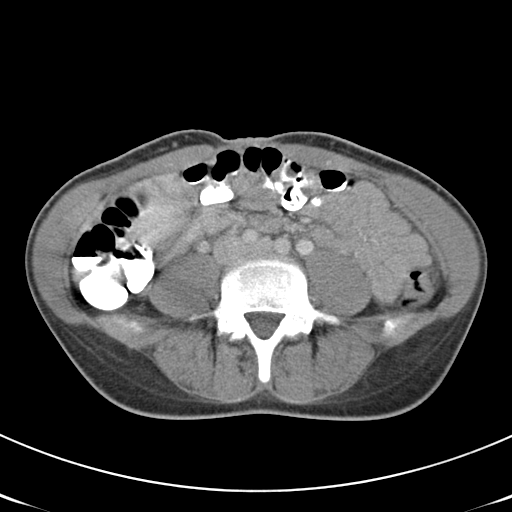
[im 43/81  soft-tissue]
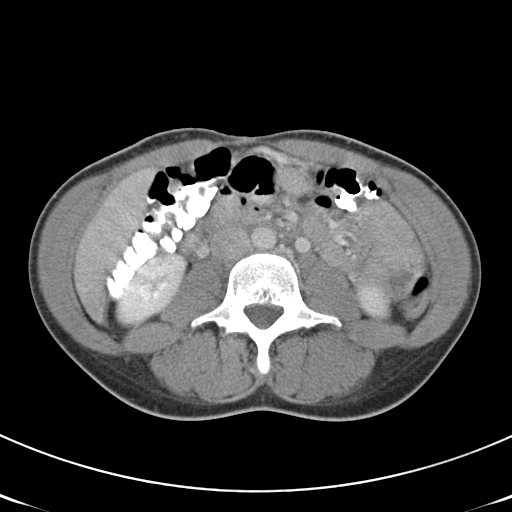
[im 47/81  soft-tissue]
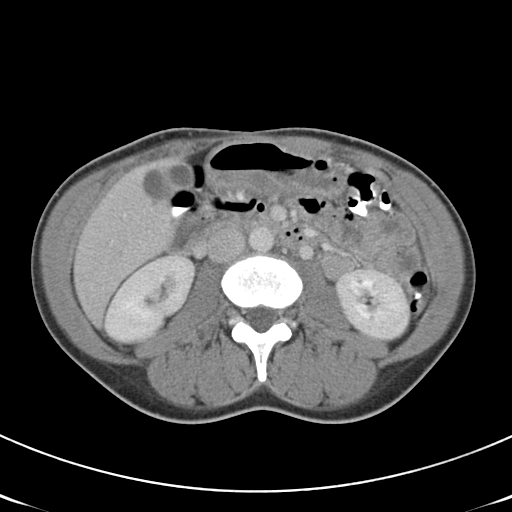
[im 47/81  bone]
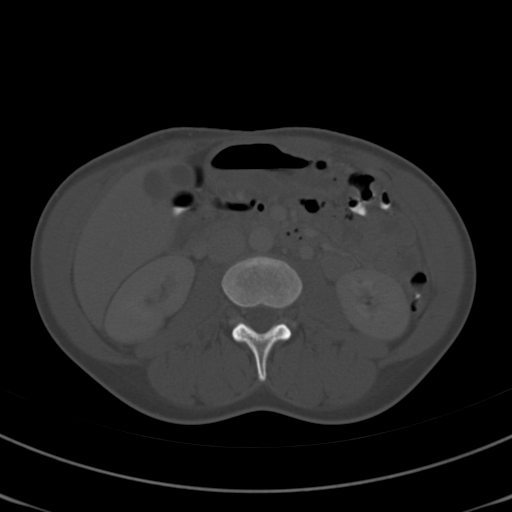
[im 55/81  soft-tissue]
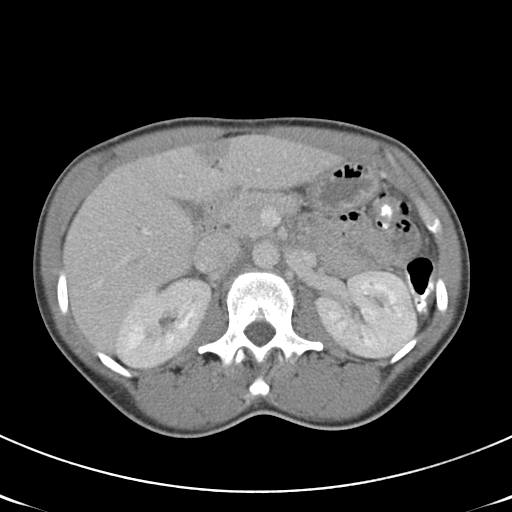
[im 59/81  soft-tissue]
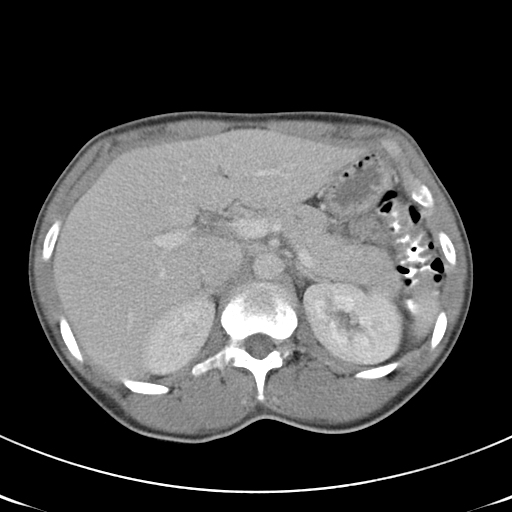
[im 64/81  soft-tissue]
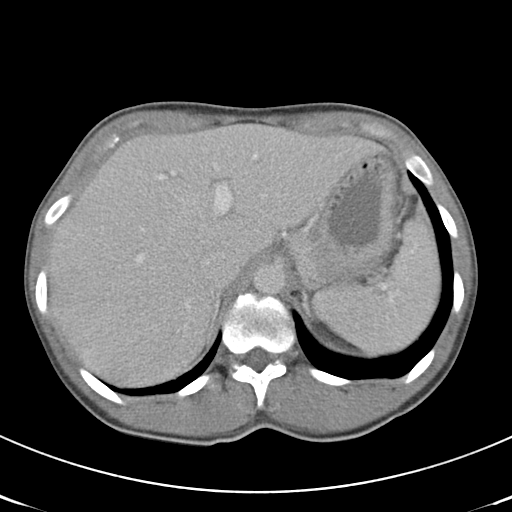
[im 72/81  soft-tissue]
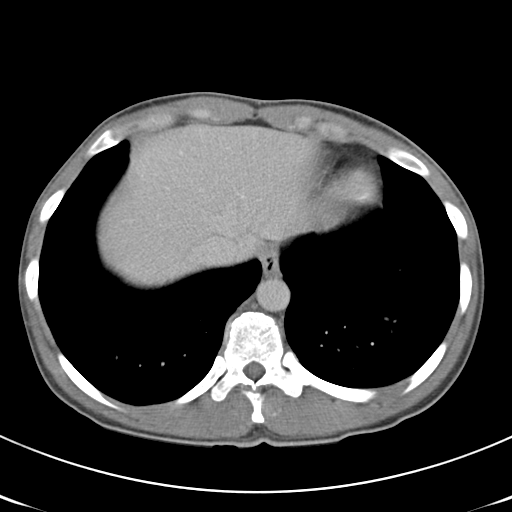
[im 76/81  soft-tissue]
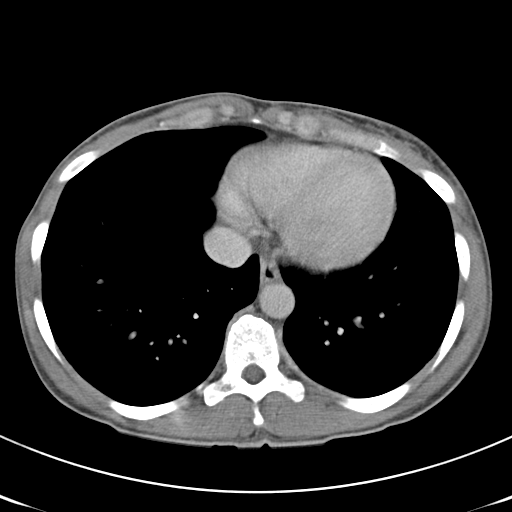

[Series 602: <mpr range> · coronal · 0.82mm/px · 3 of 59 slices shown]
[im 20/59  soft-tissue]
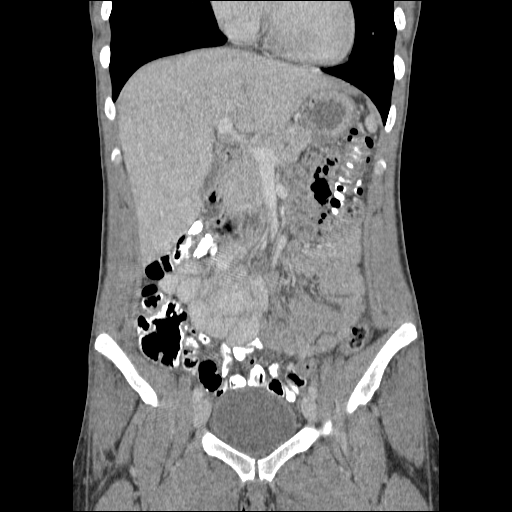
[im 26/59  soft-tissue]
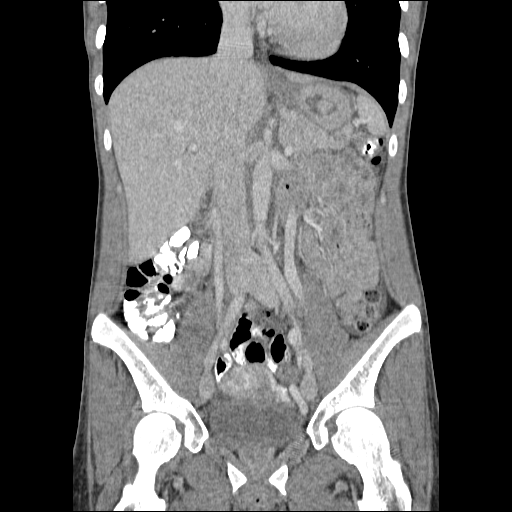
[im 33/59  soft-tissue]
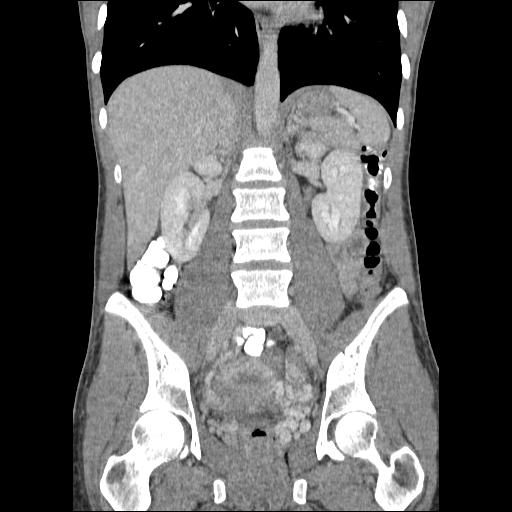

[17 of 46 positions shown; findings below may reference images not displayed]

FINDINGS: There is an area of focal fatty infiltration around the falciform
ligament. No focal lesions in the liver, spleen, pancreas, adrenals, or kidneys.
Contrast has passed into the colon. No evidence of bowel obstruction. No free
fluid, free air, or adenopathy. Lung bases are clear.

IMPRESSION

No acute findings in the abdomen.

PELVIS CT WITH CONTRAST
FINDINGS: There is a small amount of free fluid in the pelvis. Collapsing right
ovarian cyst noted. Small left ovarian cyst present. There is mild haziness
throughout the peritoneal fat in both the abdomen and pelvis. This is of unknown
etiology. No focal inflammatory process. Appendix is normal. There are prominent
venous structures within the pelvis, left greater than right. This can be seen
with venous congestion syndrome.

IMPRESSION

Small amount of free fluid.

Prominent periuterine and right and adnexal venous structures, question venous
congestion syndrome.

Appendix normal. 

Small bilateral ovarian cysts.

## 2006-07-10 IMAGING — CR DG ABDOMEN ACUTE W/ 1V CHEST
3 series · 3 of 3 positions shown · non-contrast
Comparison: None

CLINICAL DATA: Abdominal pain, vomiting

ABDOMEN SERIES - 2 VIEW & CHEST - 1 VIEW

[w chest pa *]
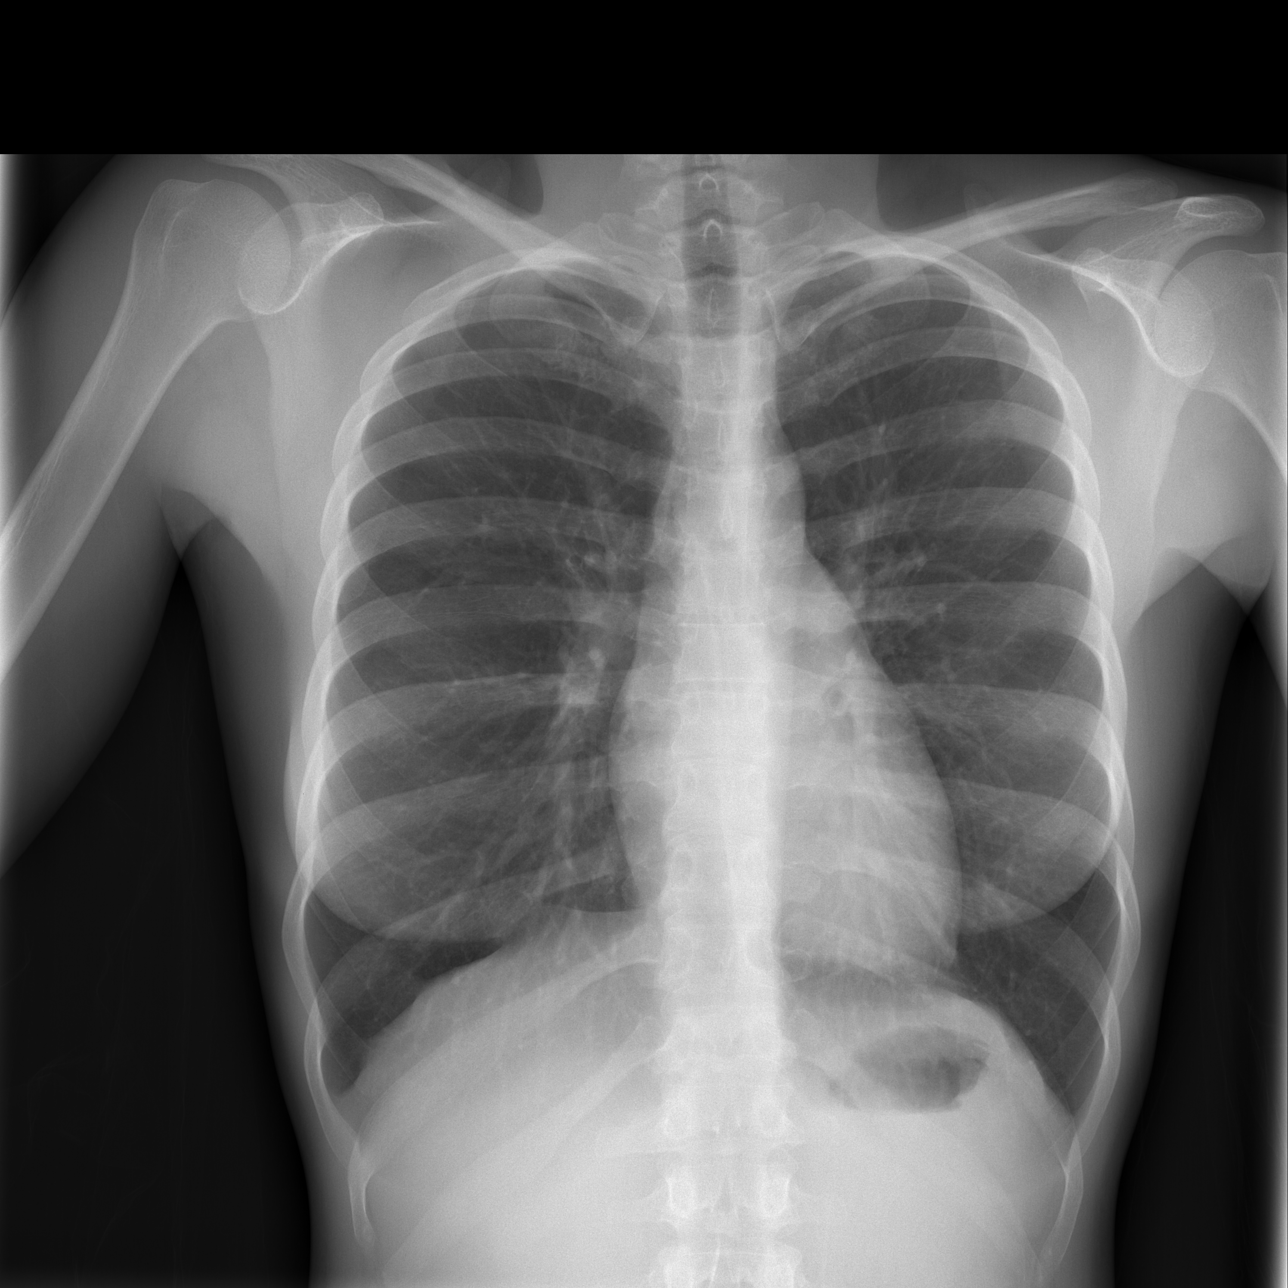

[w abdomen upright *]
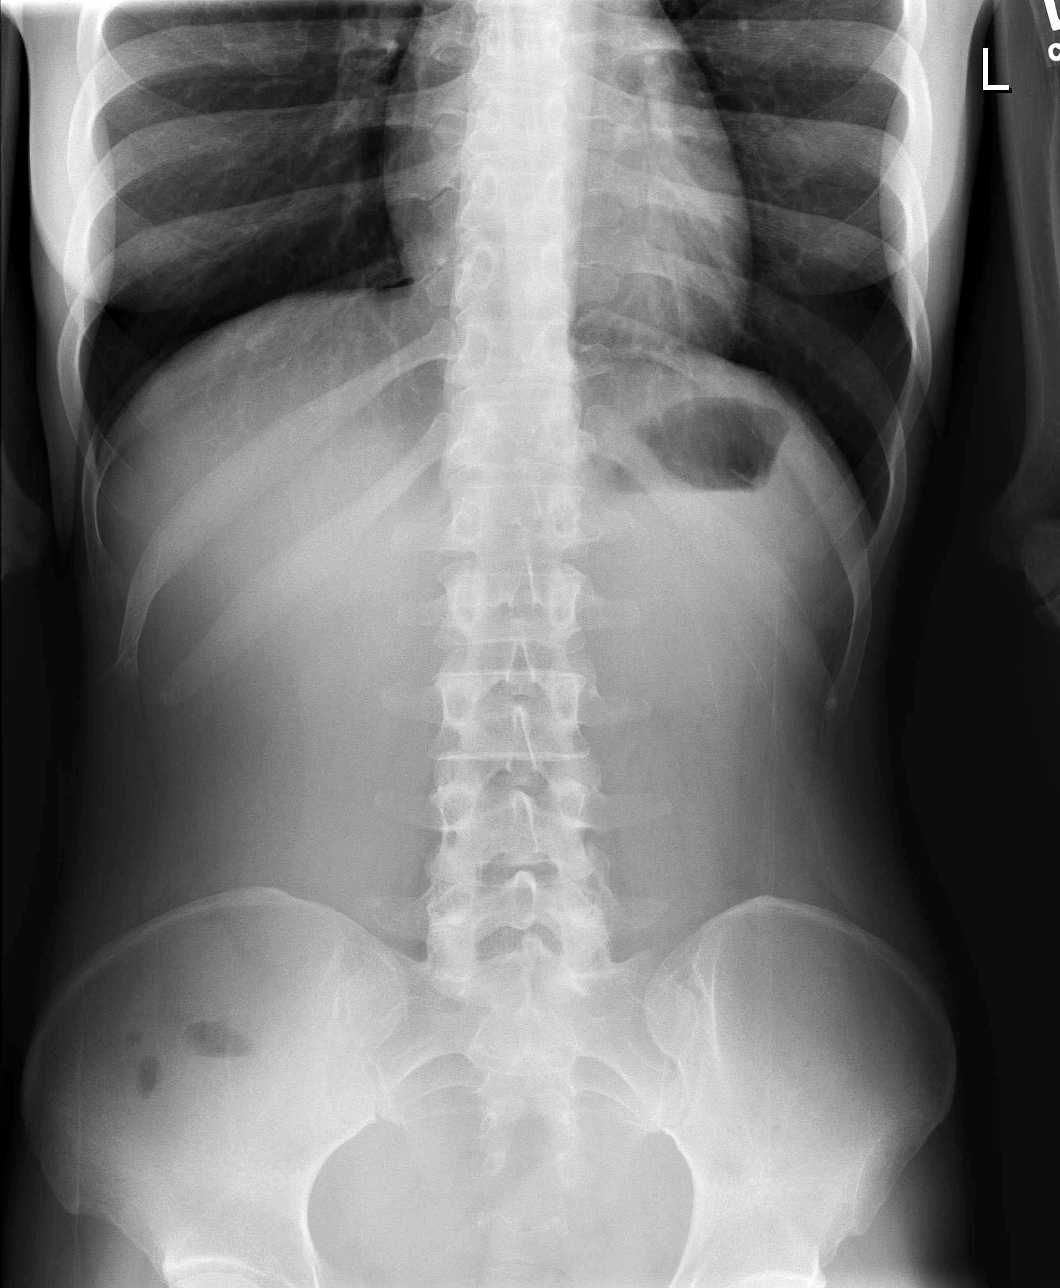

[t abdomen supine]
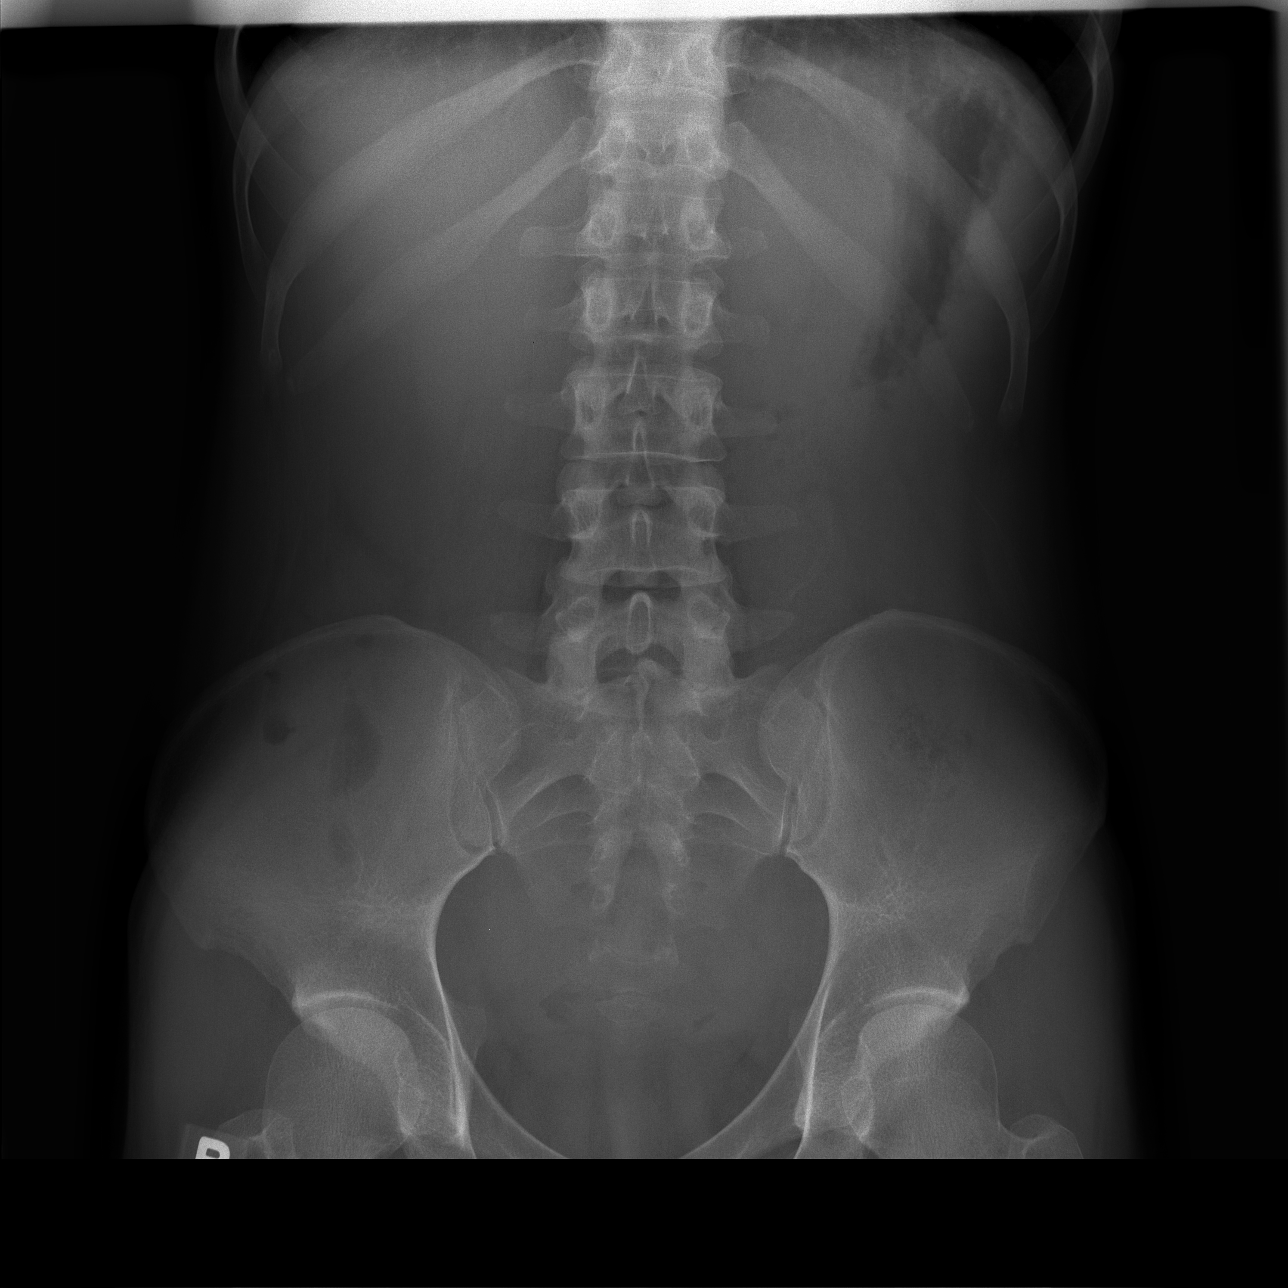

[3 of 3 positions shown; findings below may reference images not displayed]

FINDINGS: There is a nonspecific bowel gas pattern with relative paucity of gas
throughout the abdomen. No free air. No organomegaly or suspicious
calcification. 

Heart is normal size. Lungs are clear. No effusions.

IMPRESSION

Nonspecific bowel gas pattern with relative paucity of gas throughout the
abdomen. No free air.

## 2006-12-24 ENCOUNTER — Ambulatory Visit (HOSPITAL_COMMUNITY): Admission: RE | Admit: 2006-12-24 | Discharge: 2006-12-24 | Payer: Self-pay | Admitting: Family Medicine

## 2006-12-24 ENCOUNTER — Emergency Department (HOSPITAL_COMMUNITY): Admission: EM | Admit: 2006-12-24 | Discharge: 2006-12-24 | Payer: Self-pay | Admitting: Family Medicine

## 2006-12-24 IMAGING — US US OB LIMITED
1 series · 14 of 28 positions shown · non-contrast
Comparison: none

CLINICAL DATA: 30-year-old female lower abdominal and pelvic pain.  Positive pregnancy test, bleeding.  
LIMITED OBSTETRICAL ULTRASOUND AND TRANSVAGINAL OB US:

[Series 1: unknown · 14 of 56 slices shown]
[im 3/56]
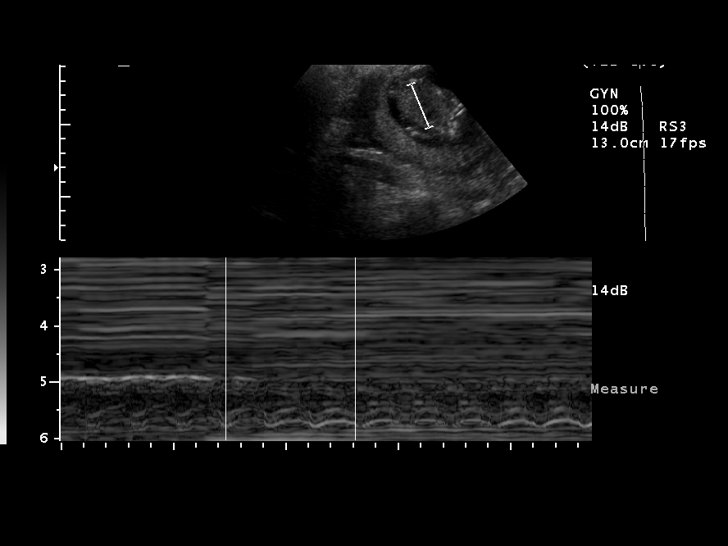
[im 7/56]
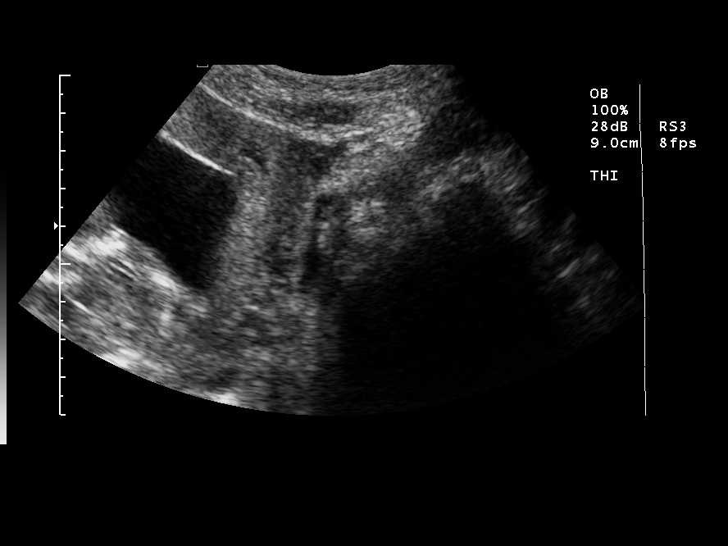
[im 11/56]
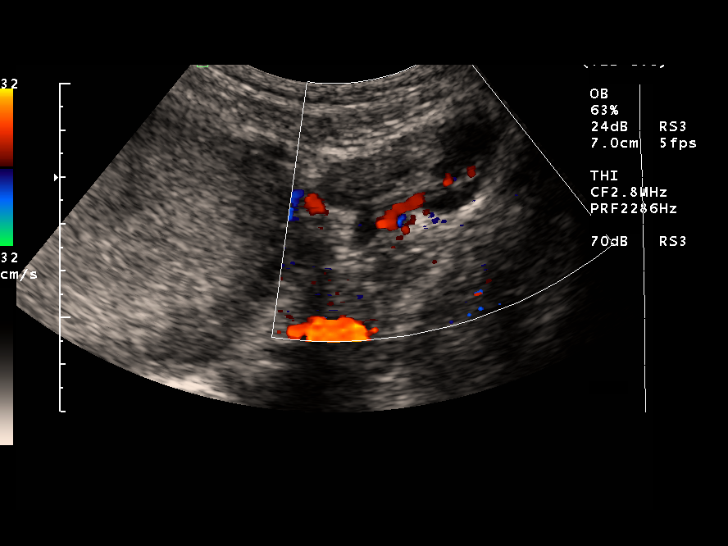
[im 15/56]
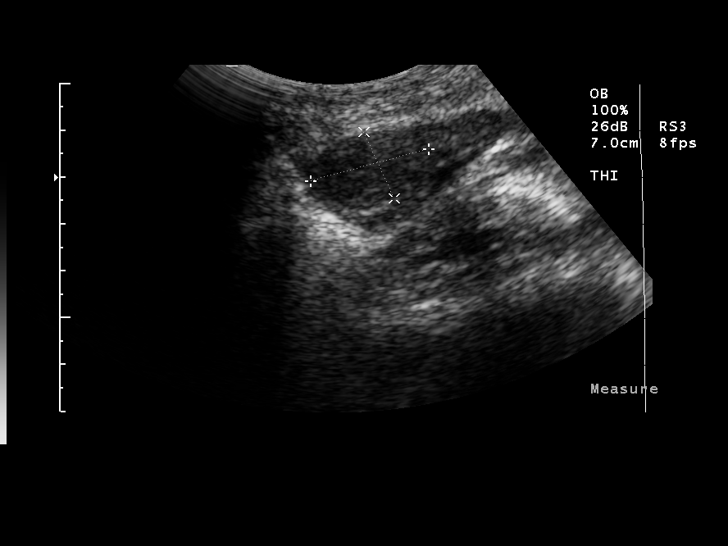
[im 19/56]
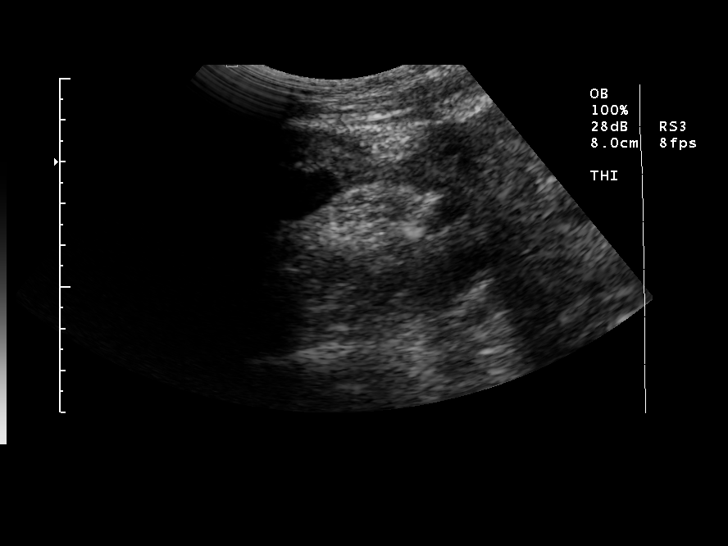
[im 23/56]
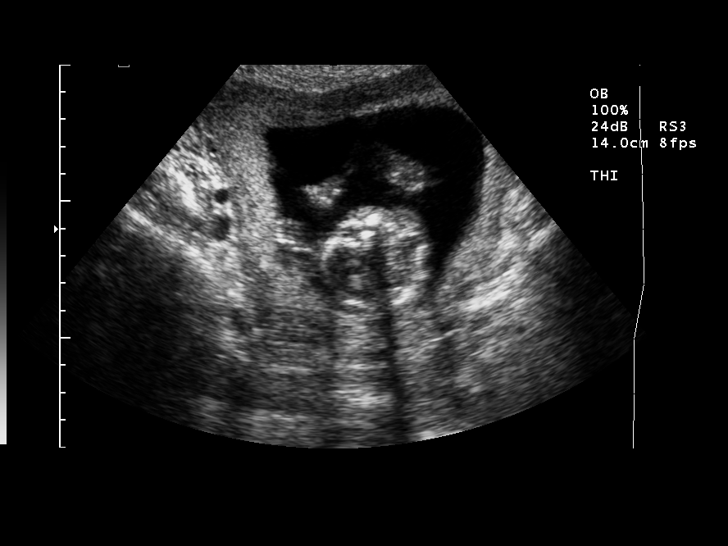
[im 27/56]
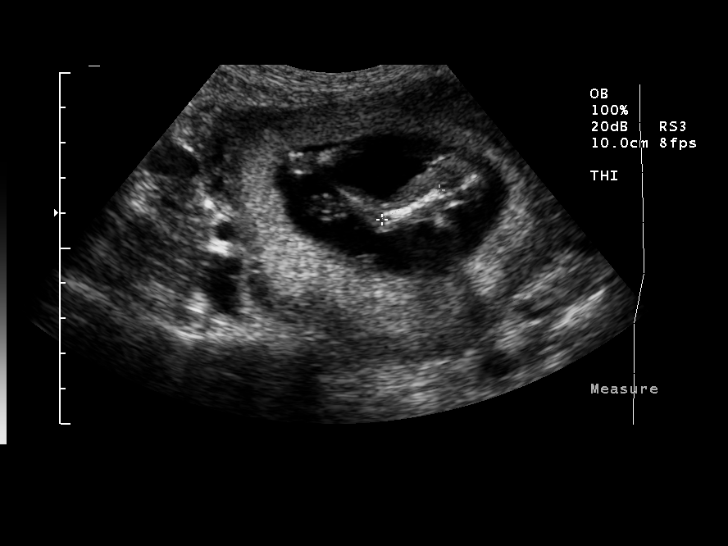
[im 31/56]
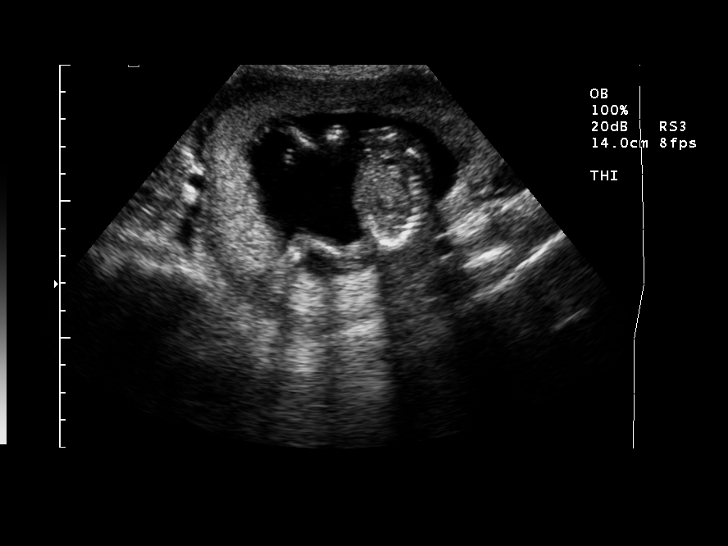
[im 35/56]
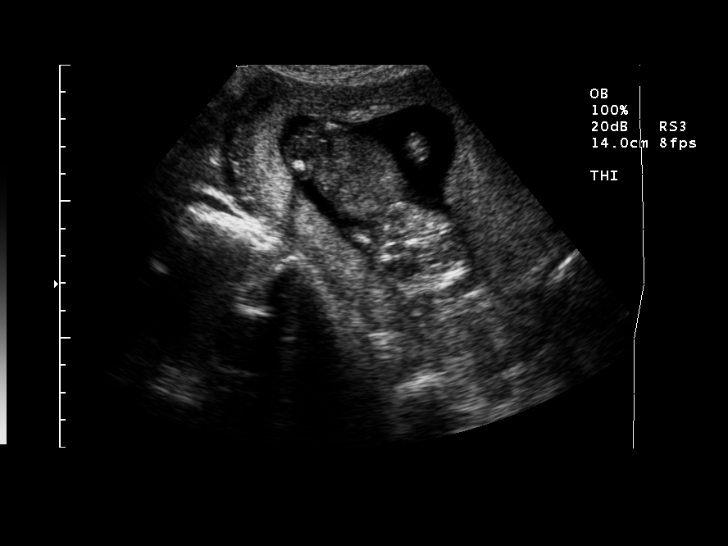
[im 39/56]
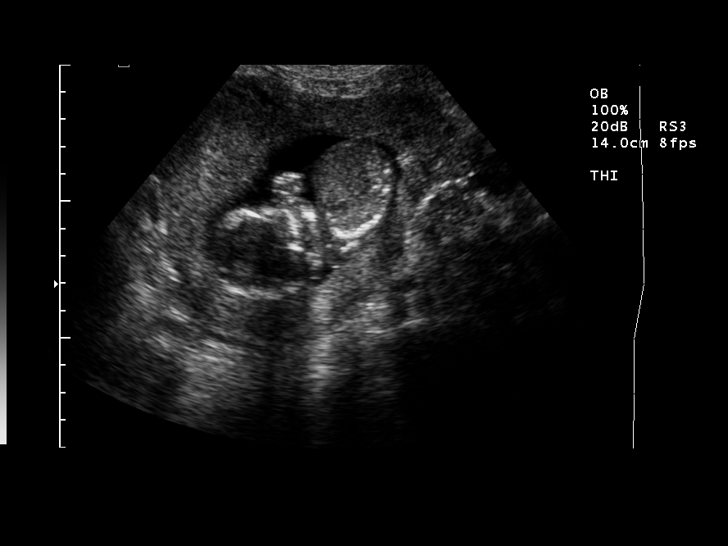
[im 43/56]
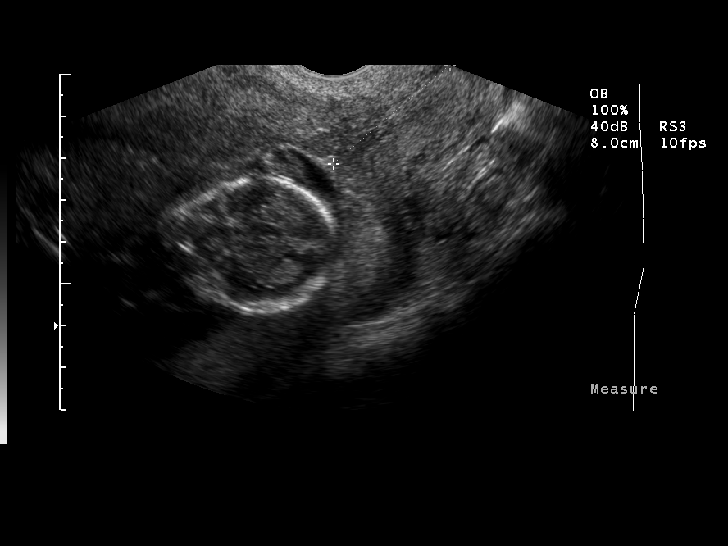
[im 47/56]
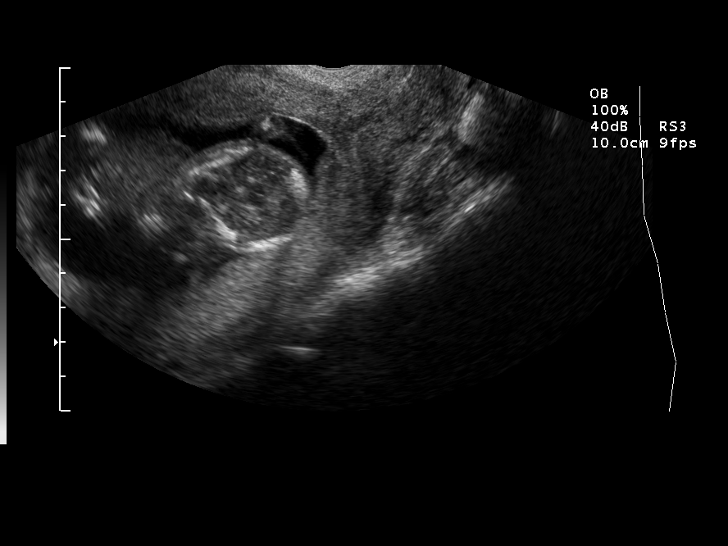
[im 51/56]
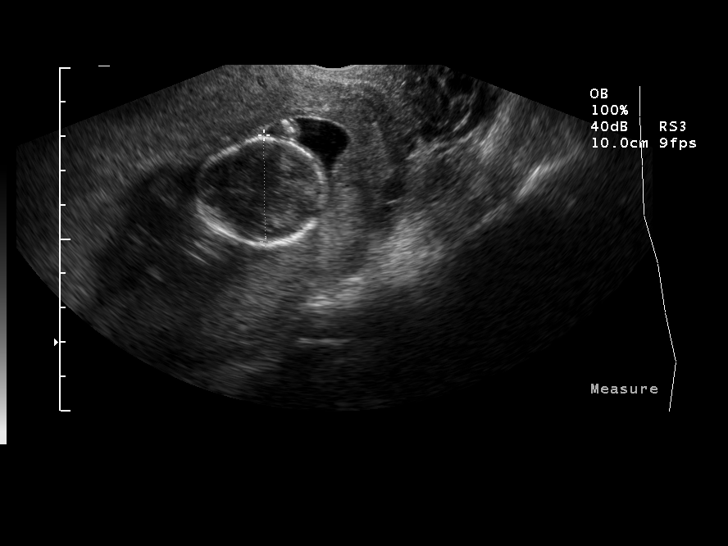
[im 56/56]
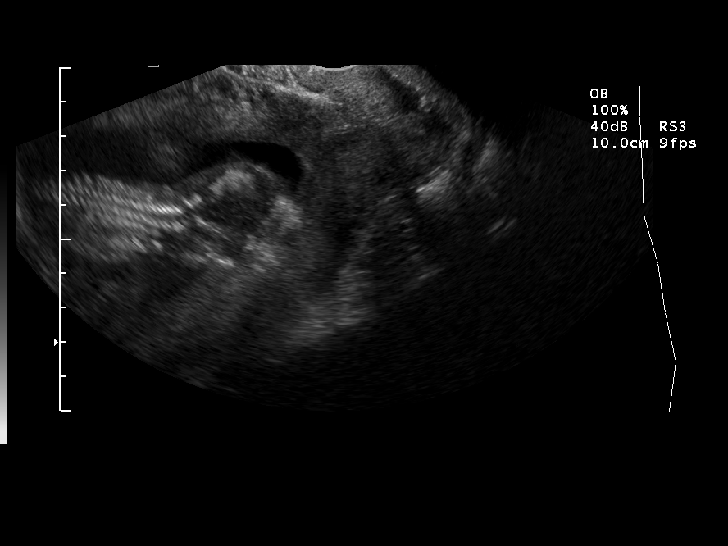

[14 of 28 positions shown; findings below may reference images not displayed]

FINDINGS: There is a single viable intrauterine fetus identified.  Heart rate is detected at 153 bpm.  Fetal movement was demonstrated on this study.  Presentation is cephalic.  Placental location is posterior lateral and is slightly low lying.  No evidence of previa.  Amniotic fluid volume is subjectively normal.  By fetal measurements, estimated gestational age is 15 weeks 2 days by femur length of 17.7 cm and biparietal diameter of 31.7 cm.  Cervix is closed measuring 3.6 cm in length.  Ovaries are unremarkable.  No significant free fluid.  No subchorionic hemorrhage.
IMPRESSION: 1.  15 week 2 day single viable fetus.  
2.  Detected heart rate 153 bpm.  
3.  Closed cervix measuring 3.6 cm in length.

## 2007-02-15 ENCOUNTER — Ambulatory Visit: Payer: Self-pay | Admitting: Obstetrics and Gynecology

## 2007-02-15 ENCOUNTER — Inpatient Hospital Stay (HOSPITAL_COMMUNITY): Admission: AD | Admit: 2007-02-15 | Discharge: 2007-02-16 | Payer: Self-pay | Admitting: Obstetrics and Gynecology

## 2007-02-15 IMAGING — US US OB COMP +14 WK
1 series · 14 of 28 positions shown · non-contrast
Comparison: none

OBSTETRICAL ULTRASOUND:

 This ultrasound exam was performed in the [HOSPITAL] Ultrasound Department.  The OB US report was generated in the AS system, and faxed to the ordering physician.  This report is also available in [REDACTED] PACS.

[Series 1: us ob comp +14 wk · 0.30mm/px · 14 of 35 slices shown]
[im 2/35]
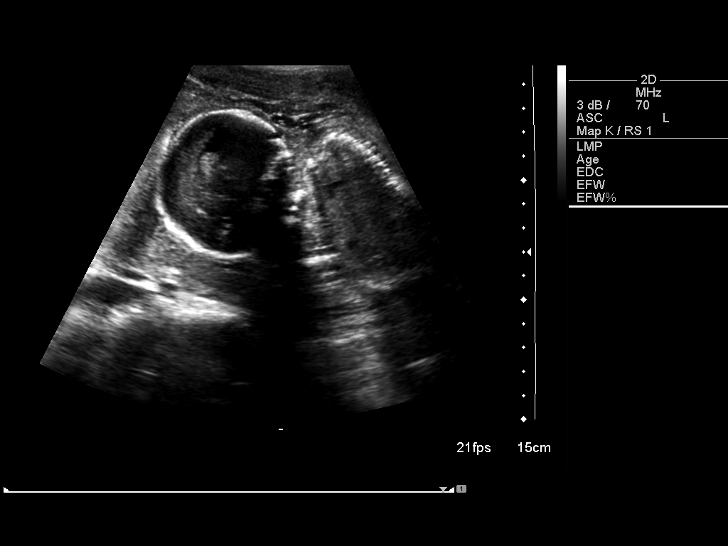
[im 4/35]
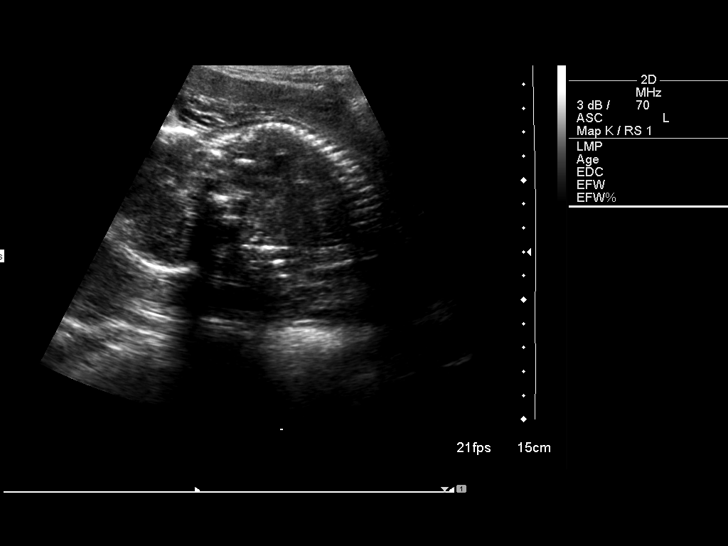
[im 7/35]
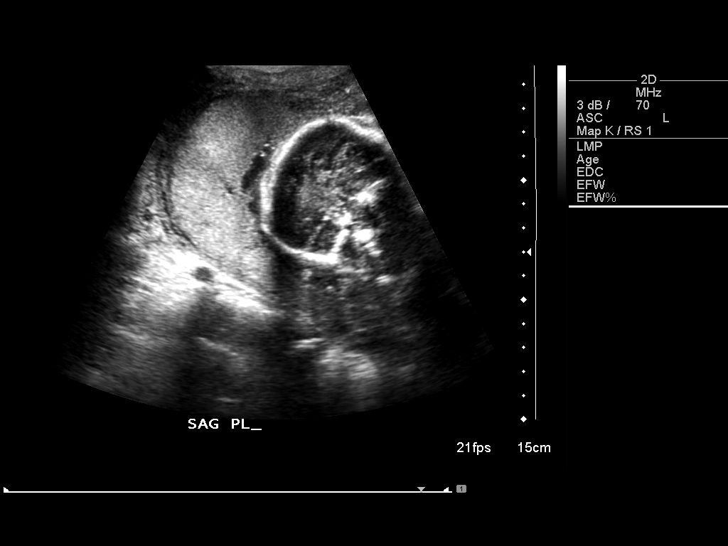
[im 9/35]
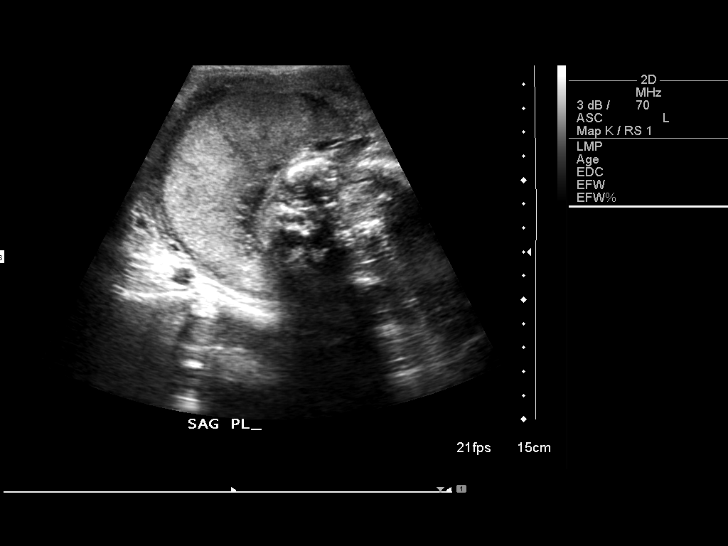
[im 12/35]
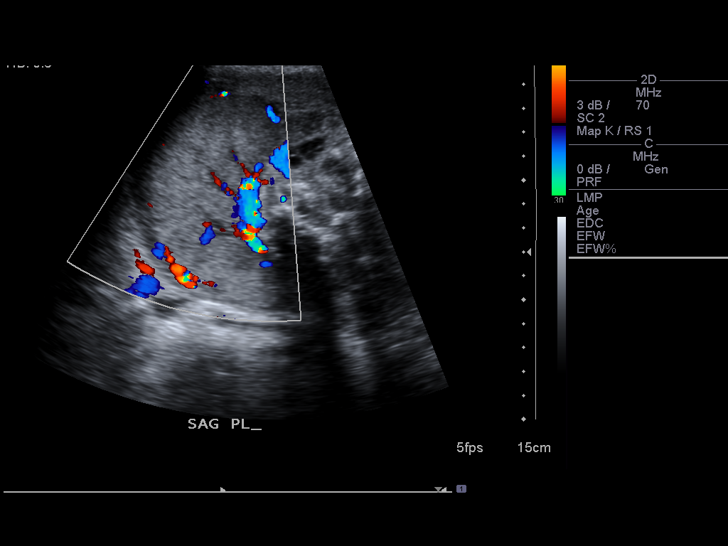
[im 14/35]
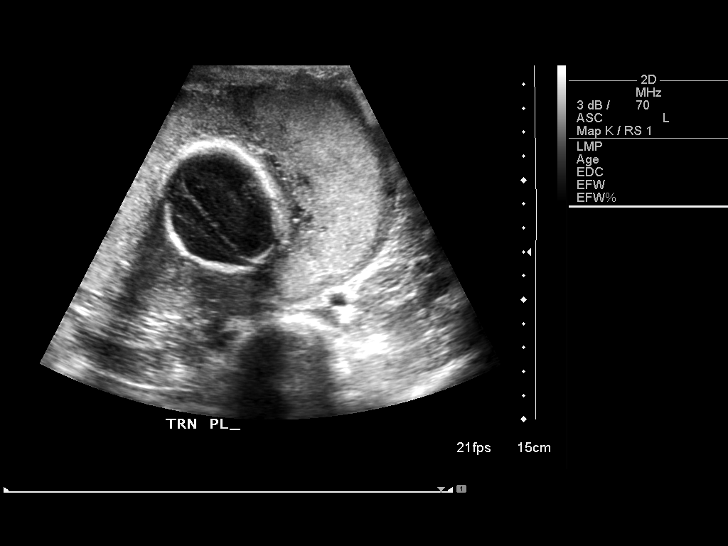
[im 17/35]
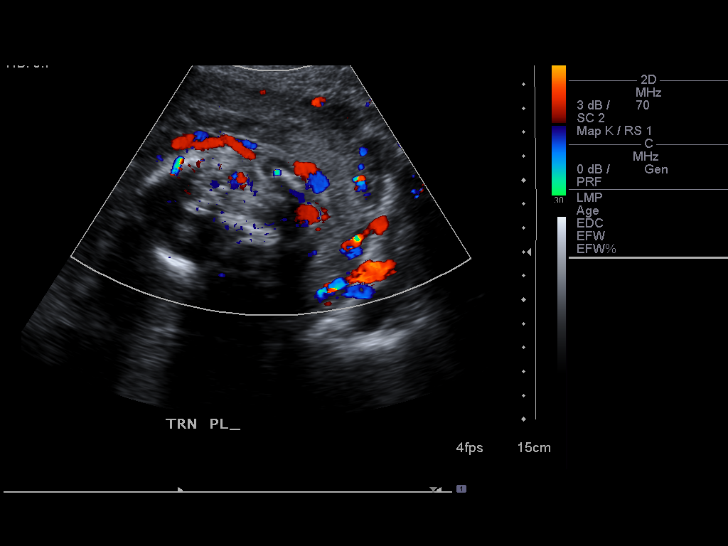
[im 19/35]
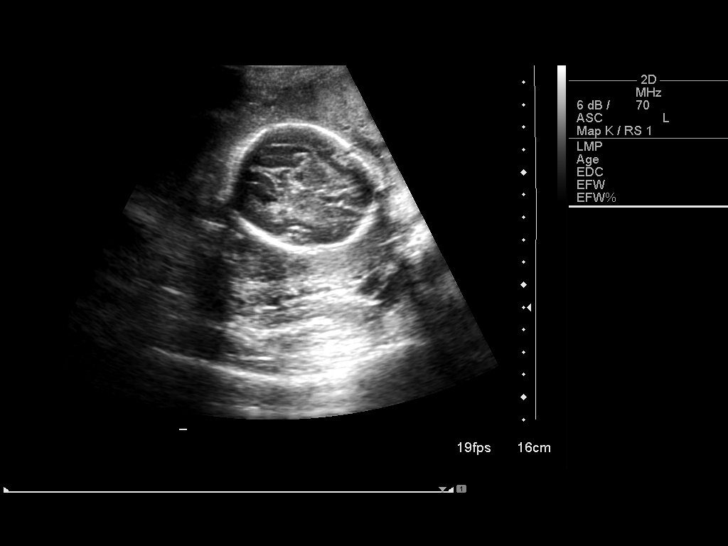
[im 22/35]
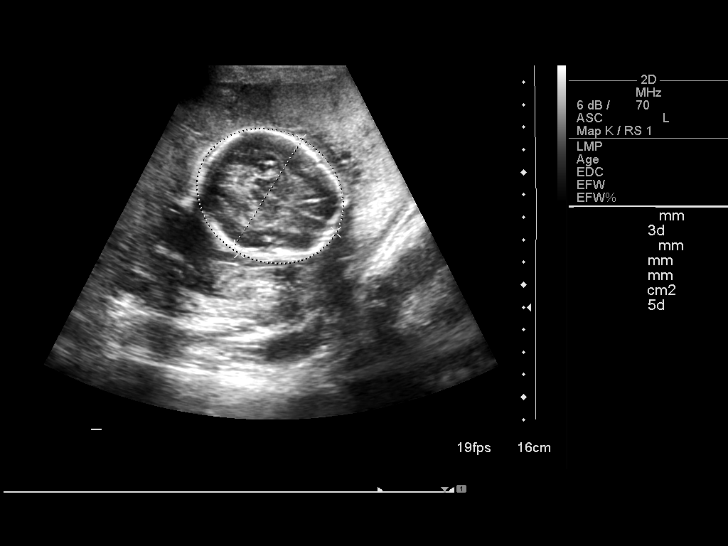
[im 24/35]
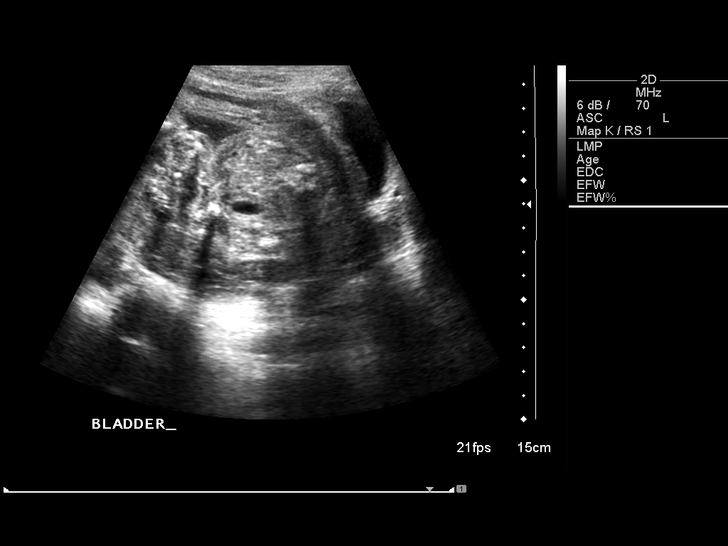
[im 27/35]
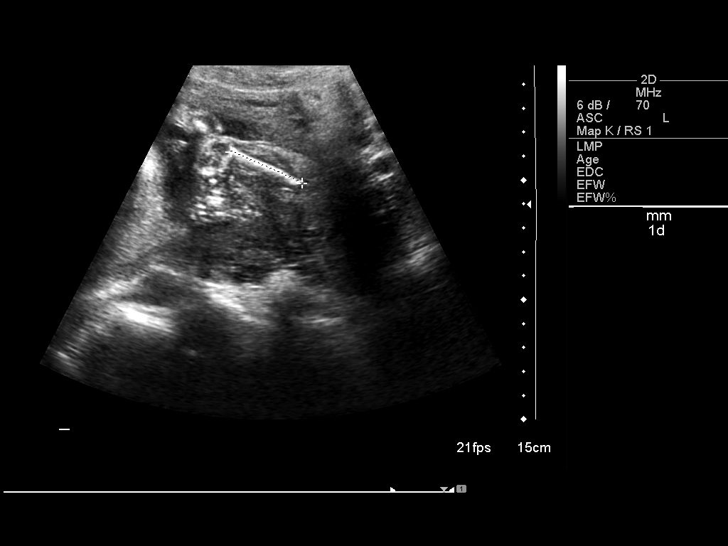
[im 29/35]
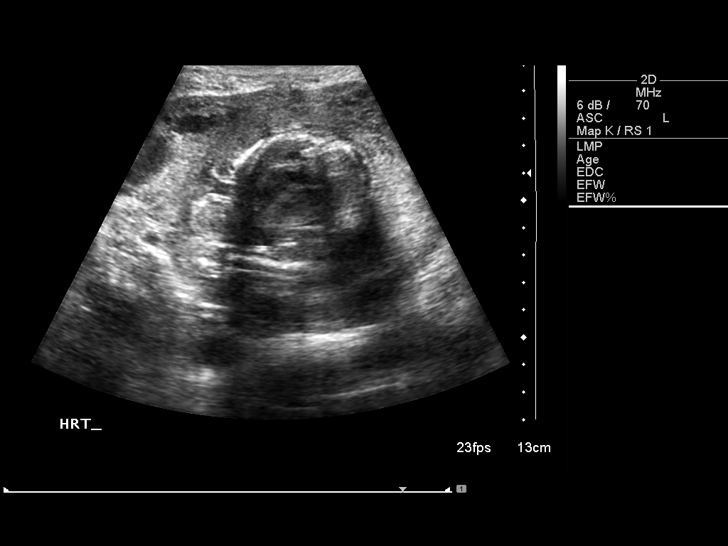
[im 32/35]
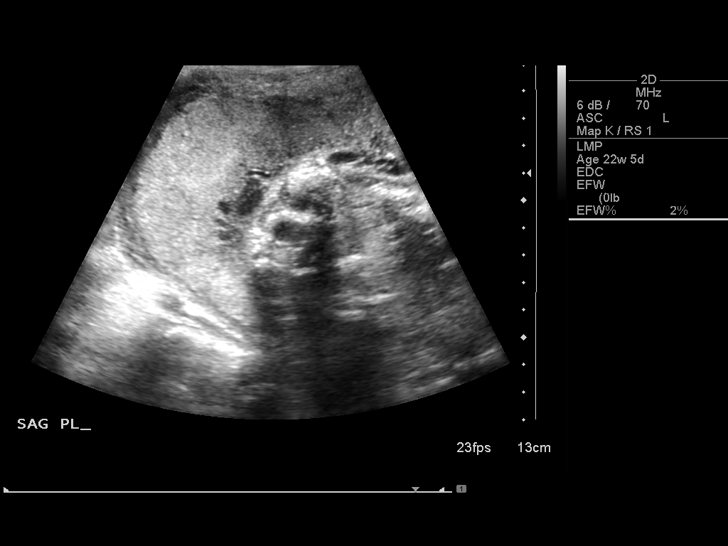
[im 35/35]
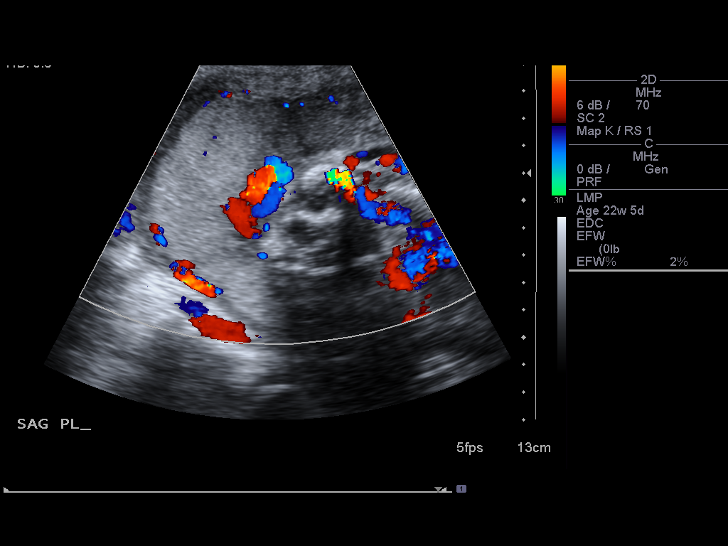

[14 of 28 positions shown; findings below may reference images not displayed]

IMPRESSION: See AS Obstetric US report.

## 2007-03-07 ENCOUNTER — Emergency Department (HOSPITAL_COMMUNITY): Admission: EM | Admit: 2007-03-07 | Discharge: 2007-03-07 | Payer: Self-pay | Admitting: Emergency Medicine

## 2007-03-07 IMAGING — CR DG CHEST 2V
2 series · 2 of 2 positions shown · non-contrast
Comparison: [DATE]

CLINICAL DATA: Left-sided chest pain.

CHEST - 2 VIEW

[w chest lat]
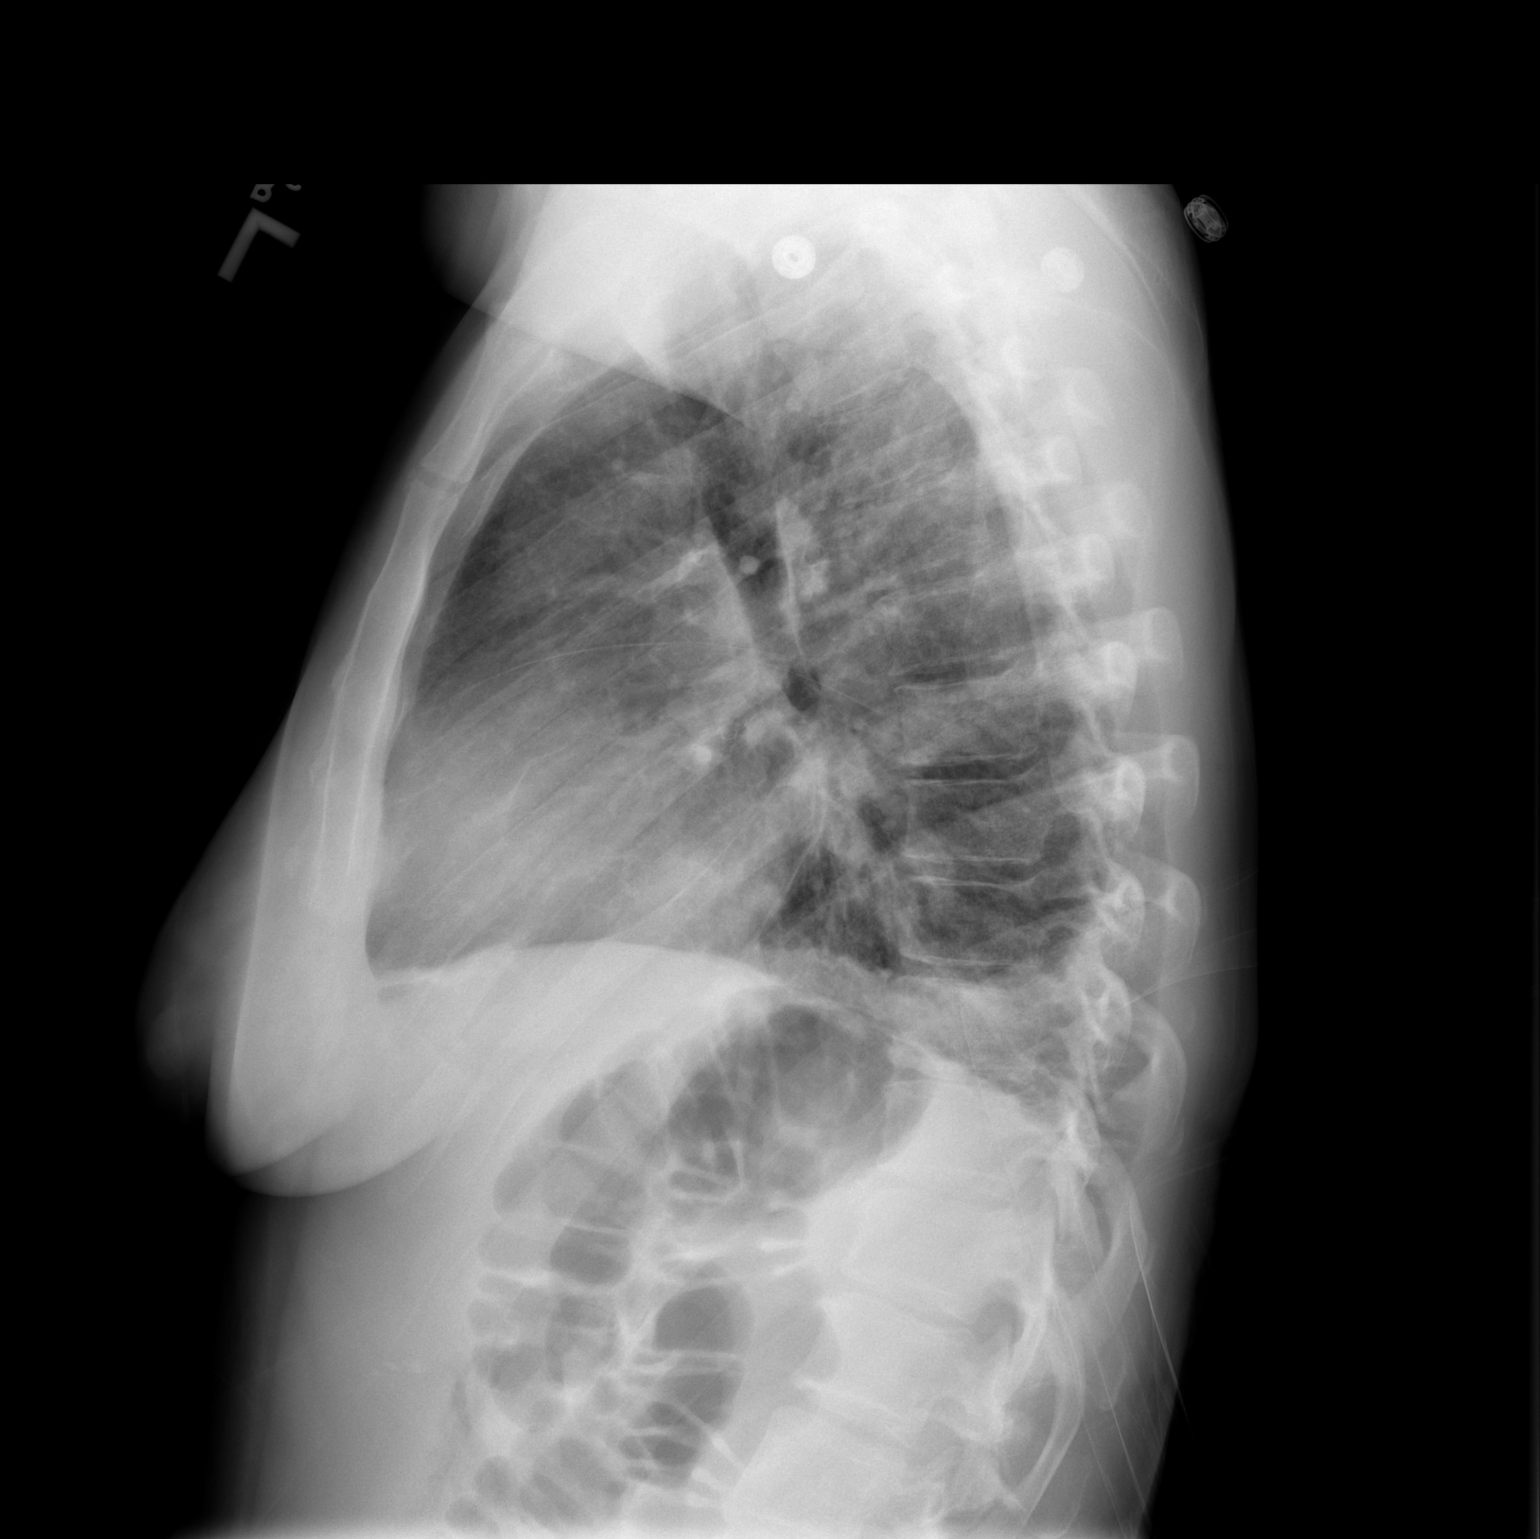

[w chest ap]
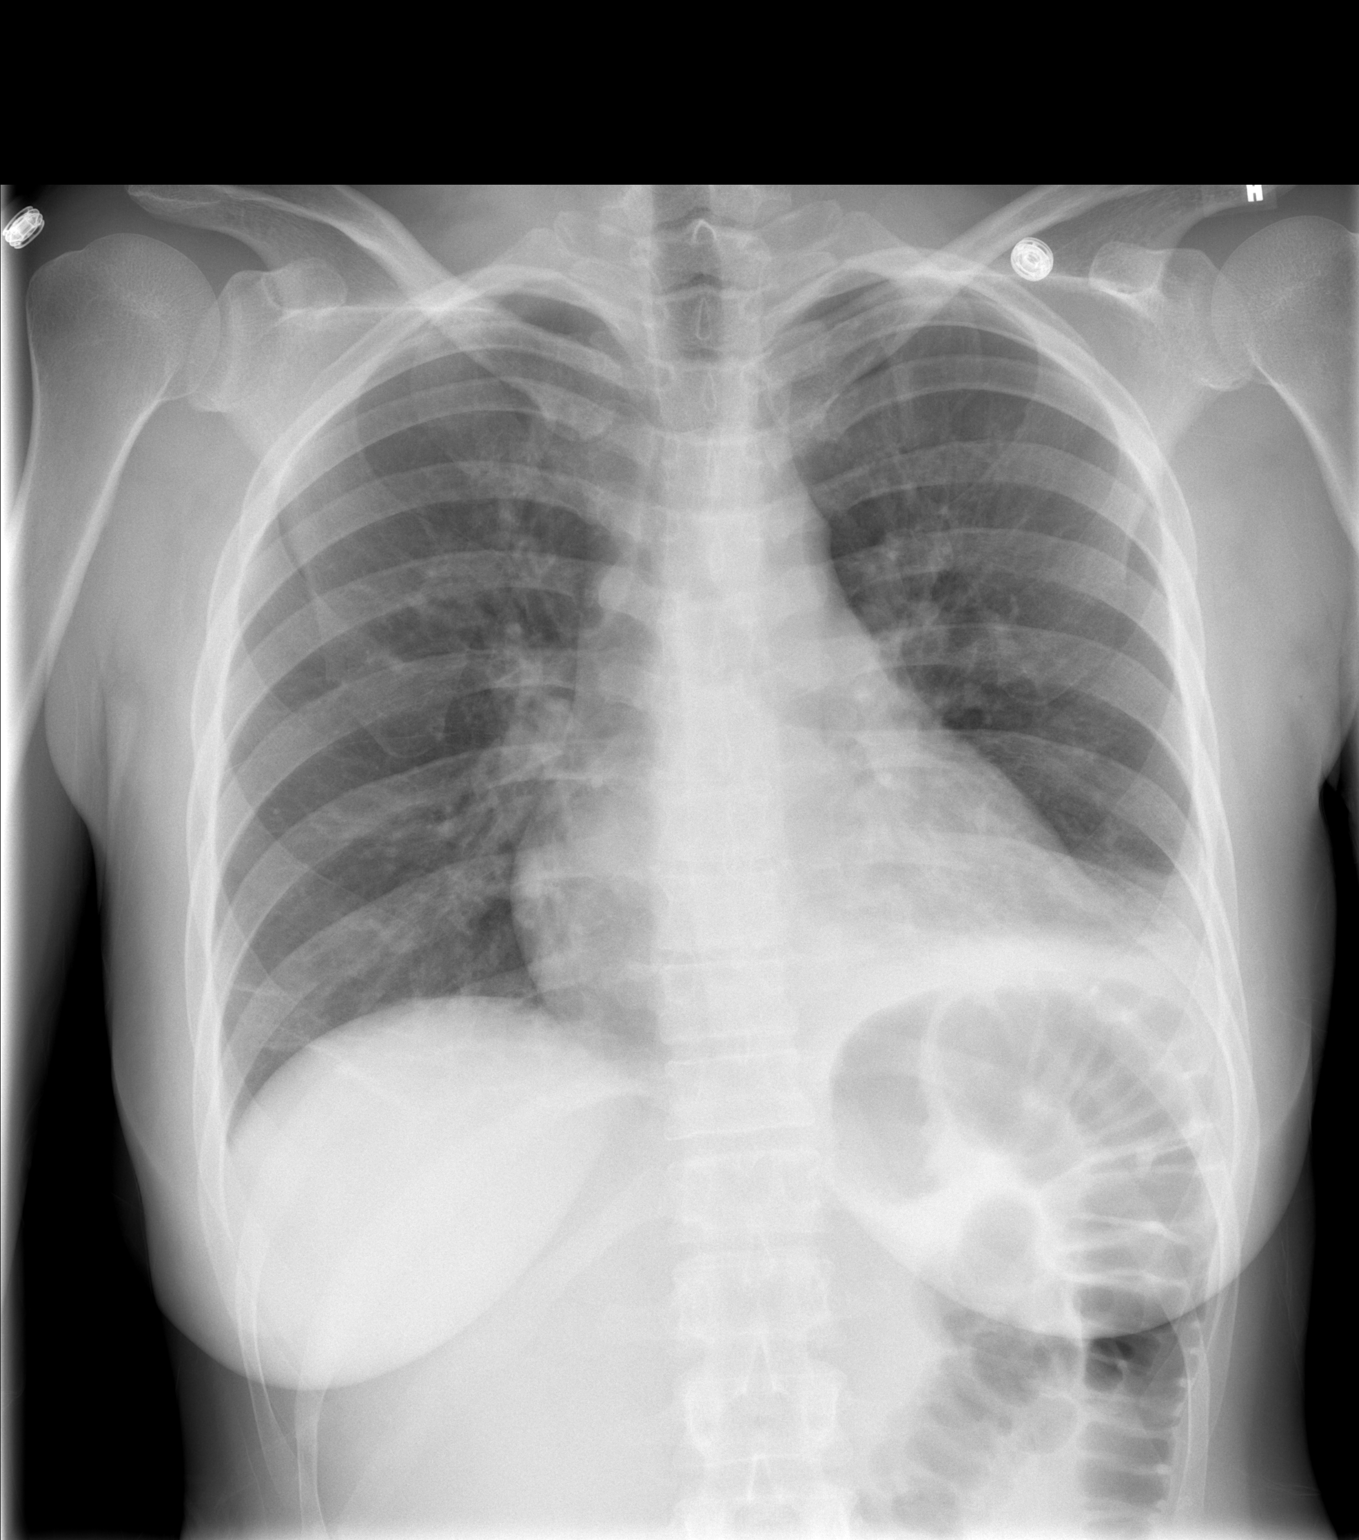

[2 of 2 positions shown; findings below may reference images not displayed]

FINDINGS: Midline trachea. Normal heart size and mediastinal contours. Small
left-sided pleural effusion. No pneumothorax or congestive failure. Minimal
right base linear opacities likely subsegmental atelectasis.

Left lower lobe mild airspace disease.

Note is made of prominent gas within the splenic flexure of the colon.

IMPRESSION

1. Small left pleural effusion with adjacent airspace which could represent
atelectasis or early infection. Differential considerations would include
left-sided pulmonary embolism.

## 2007-03-07 IMAGING — CT CT ANGIO CHEST
4 of 9 series · 13 of 32 positions shown · IV contrast (100 ML OMNI 300)
Comparison: Plain film earlier today. CT [DATE] of the abdomen.

CLINICAL DATA: Postpartum. Left upper quadrant pain for 2 days. Low-grade fever.

CT ANGIOGRAPHY OF CHEST - PULMONARY EMBOLISM PROTOCOL
TECHNIQUE: Multidetector CT imaging of the chest was performed according to the
protocol for detection of pulmonary embolism during bolus injection of
intravenous contrast.  Coronal and sagittal plane CT angiographic image
reconstructions were also generated.
Contrast:  170 cc Omnipaque 300

[Series 4: pe · axial · 0.69mm/px · z∈[-210,-38]mm · 5 of 230 slices shown (1 of 2)]
[im 46/230  lung]
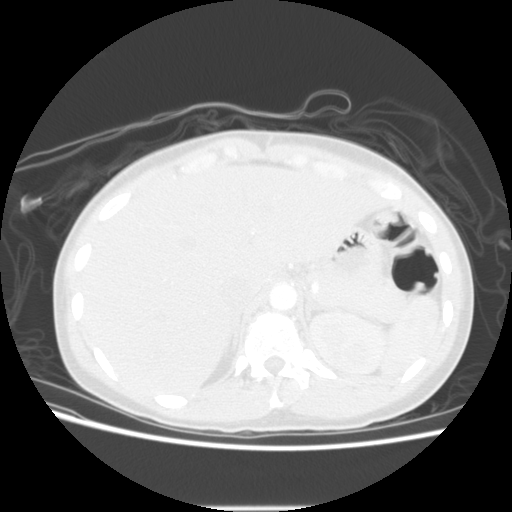
[im 92/230  mediastinal]
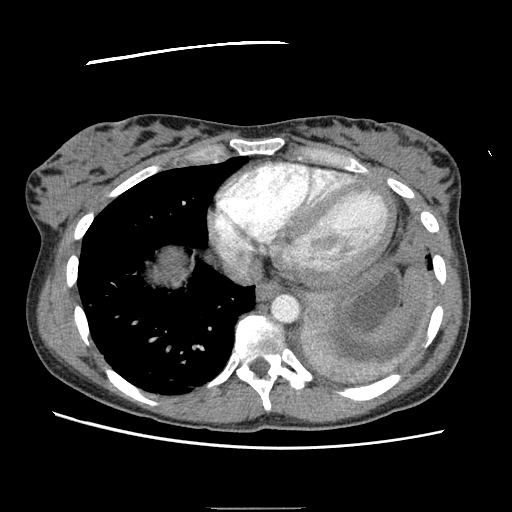
[im 120/230  lung]
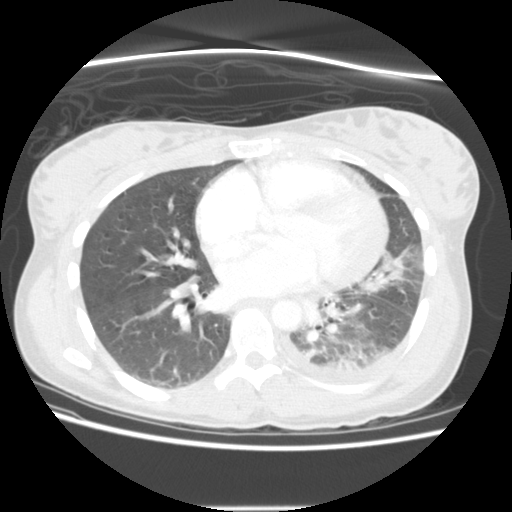
[im 138/230  mediastinal]
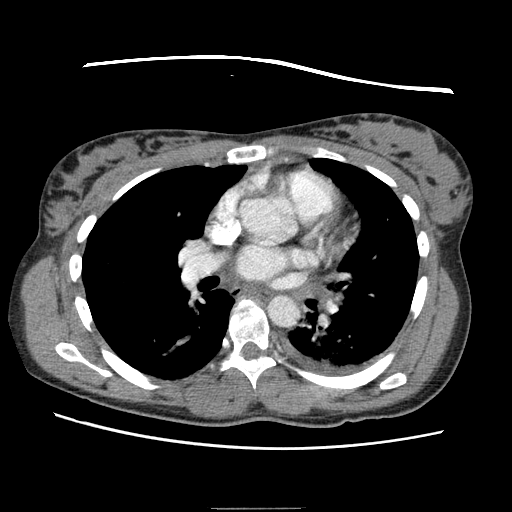
[im 184/230  lung]
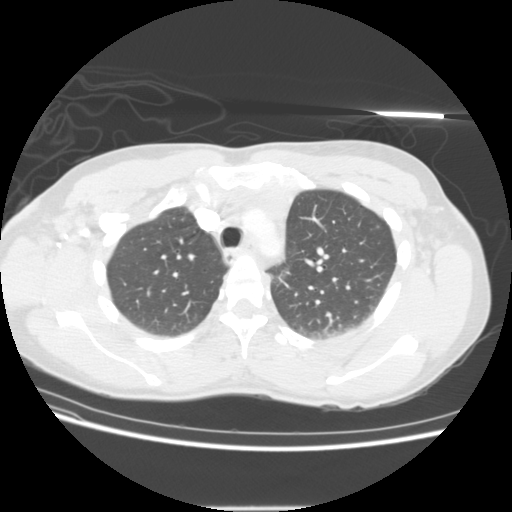

[Series 5: recon 2: pe · axial · 0.69mm/px · z∈[-123,-118]mm · 2 of 115 slices shown]
[im 58/115  lung]
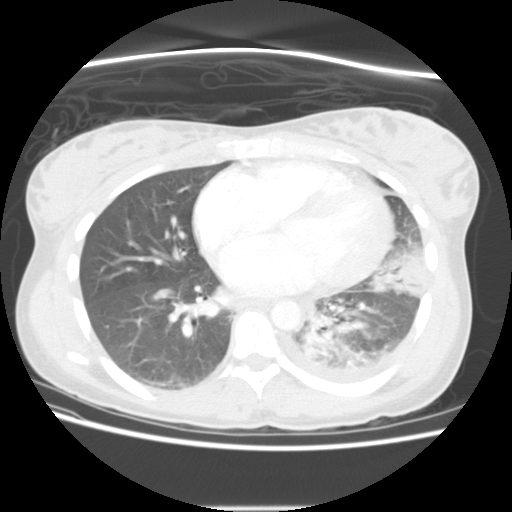
[im 60/115  lung]
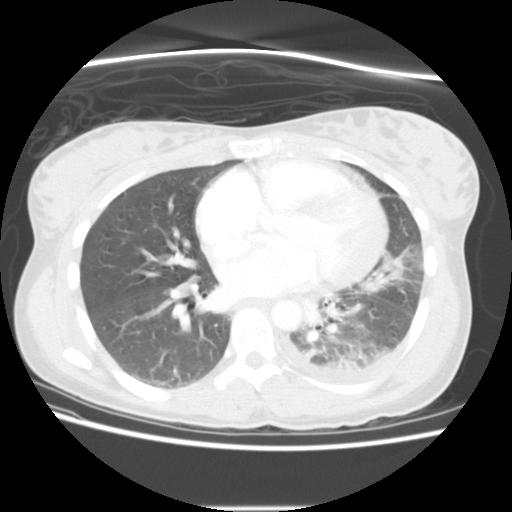

[Series 7: pe · axial · 0.70mm/px · z∈[-161,-41]mm · 4 of 192 slices shown (2 of 2)]
[im 48/192  lung]
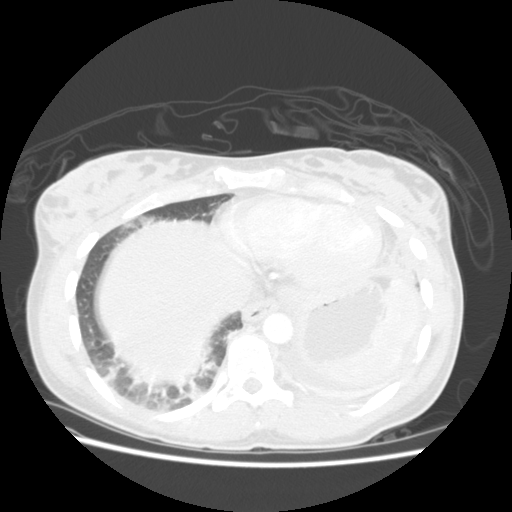
[im 83/192  lung]
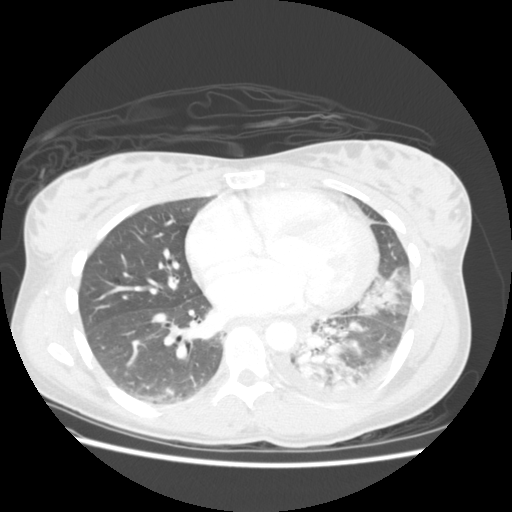
[im 96/192  lung]
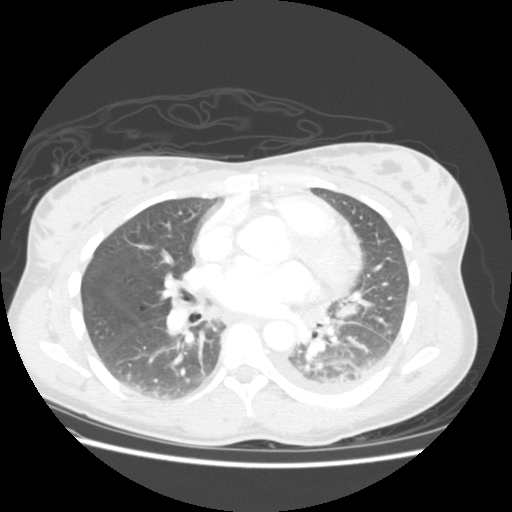
[im 144/192  lung]
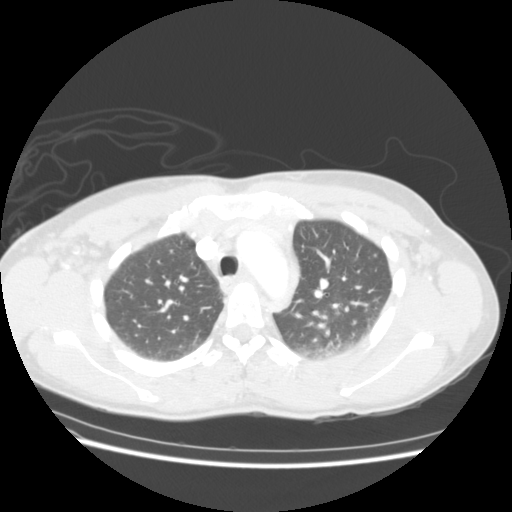

[Series 700: sag chest · sagittal · 0.59mm/px · 2 of 151 slices shown]
[im 51/151  lung]
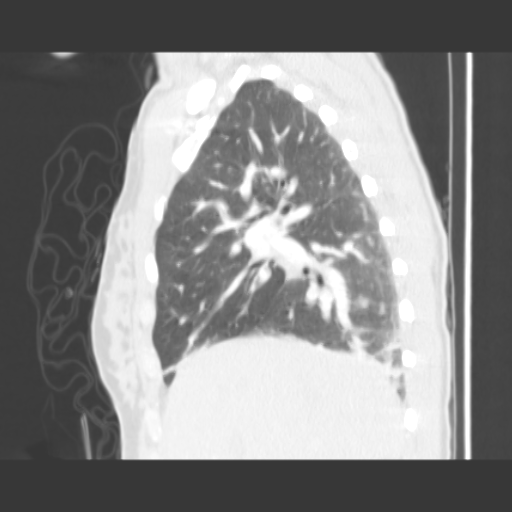
[im 101/151  lung]
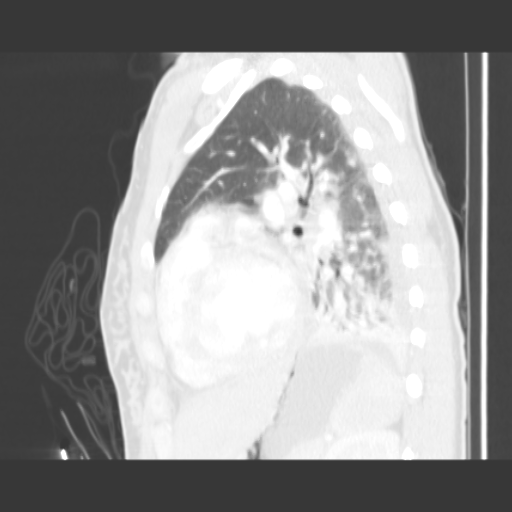

[13 of 32 positions shown; findings below may reference images not displayed]

FINDINGS: Lung windows demonstrate right base airspace disease, likely
atelectasis. Lingular and left lower lobe airspace disease most consistent with
infection.

Soft tissue windows:  The quality of this exam for evaluation of pulmonary
embolism is moderate to poor. Limitations are motion artifact and optimal bolus
timing despite 2 attempts. No central pulmonary embolism. No lobar embolism.
Subsegmental and small segmental emboli can't be excluded.

Prominent but likely reactive axillary lymph nodes bilaterally.

Moderate cardiomegaly. Small left pleural effusion. No mediastinal adenopathy.
Mild left hilar and infrahilar adenopathy is likely reactive.

Limited abdominal image in is unremarkable.

IMPRESSION

1. The exam is of moderate quality for evaluation of pulmonary embolism. No
embolism identified, given limitations above.
2. Left lower lobe and lingular pneumonia. Small left pleural effusion and left
infrahilar adenopathy are likely secondary.
3. Cardiomegaly.
The exam was repeated after the initial 100 cc contrast administration due to
suboptimal quality. A repeat study with 70 cc was performed. A total of 170 cc
were administered.

## 2007-07-11 ENCOUNTER — Emergency Department (HOSPITAL_COMMUNITY): Admission: EM | Admit: 2007-07-11 | Discharge: 2007-07-11 | Payer: Self-pay | Admitting: Emergency Medicine

## 2007-07-11 IMAGING — CR DG HAND COMPLETE 3+V*R*
3 series · 3 of 3 positions shown · non-contrast
Comparison: [DATE]

CLINICAL DATA: Dog bite to the middle finger

RIGHT HAND - 3 VIEW

[view not recorded (1 of 3)]
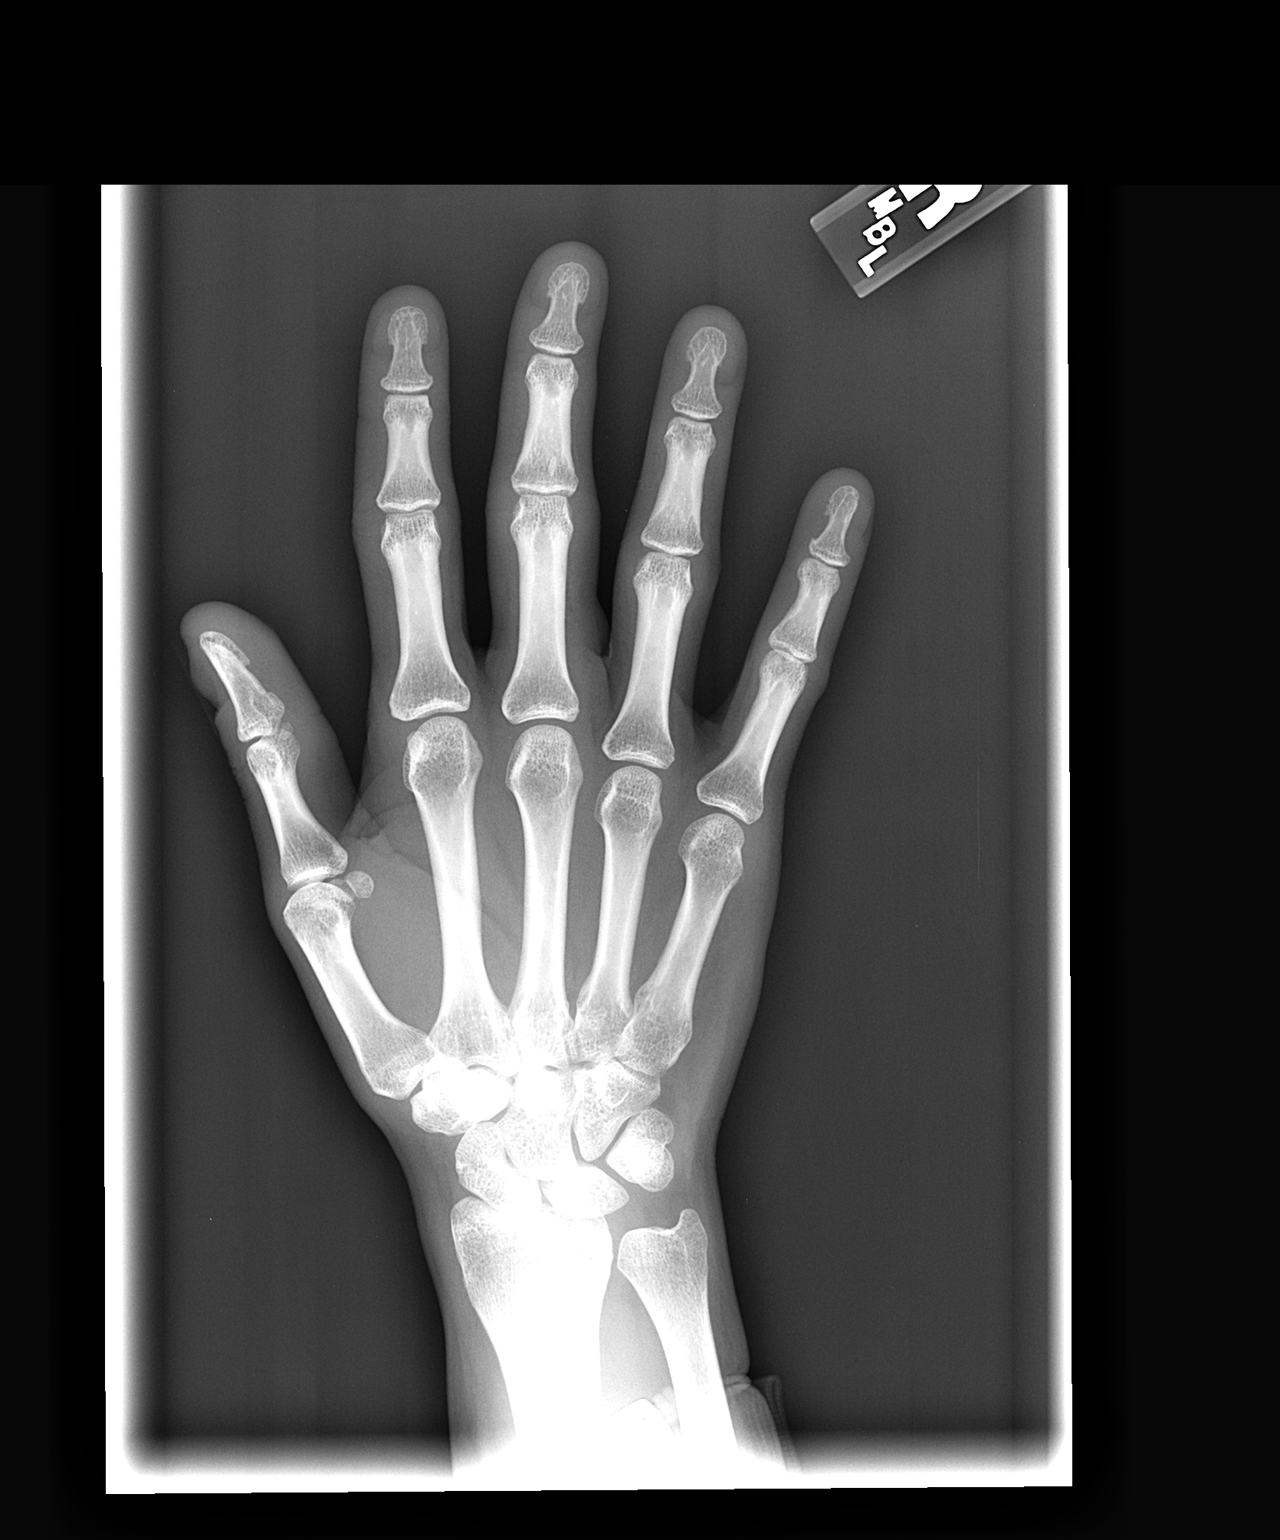

[view not recorded (2 of 3)]
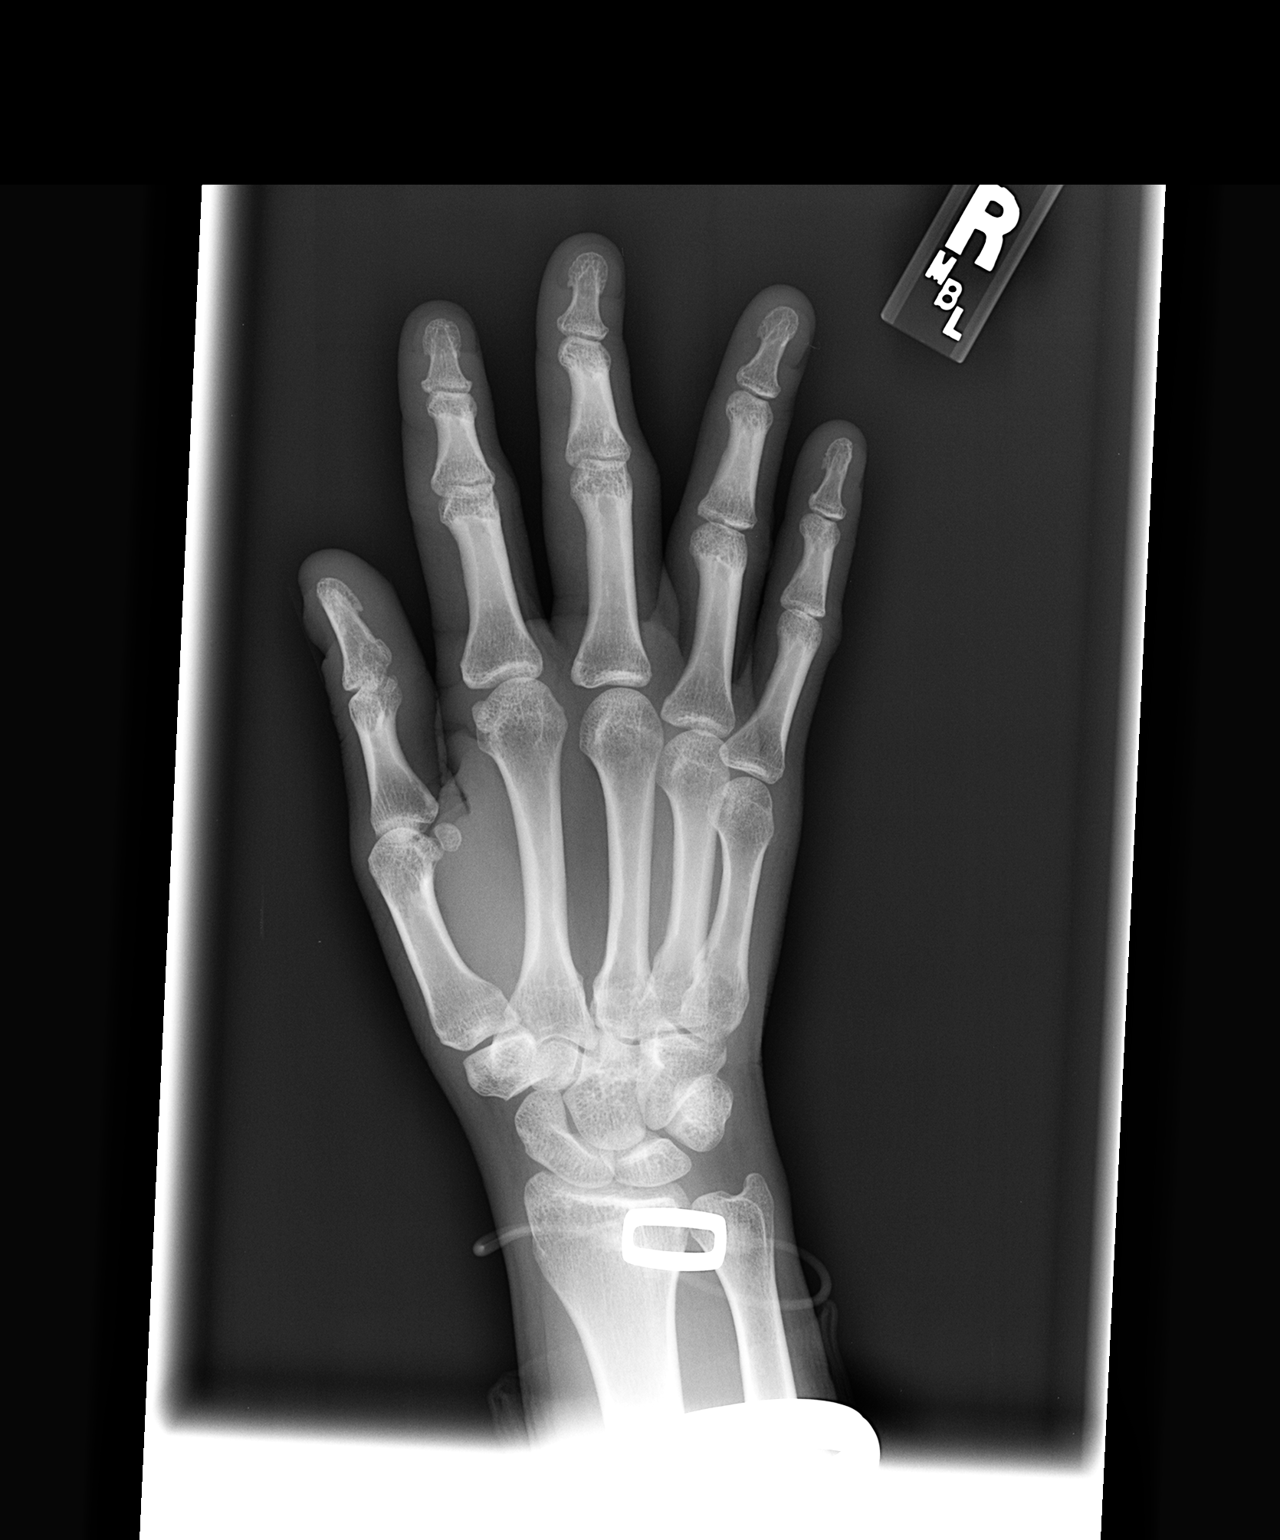

[view not recorded (3 of 3)]
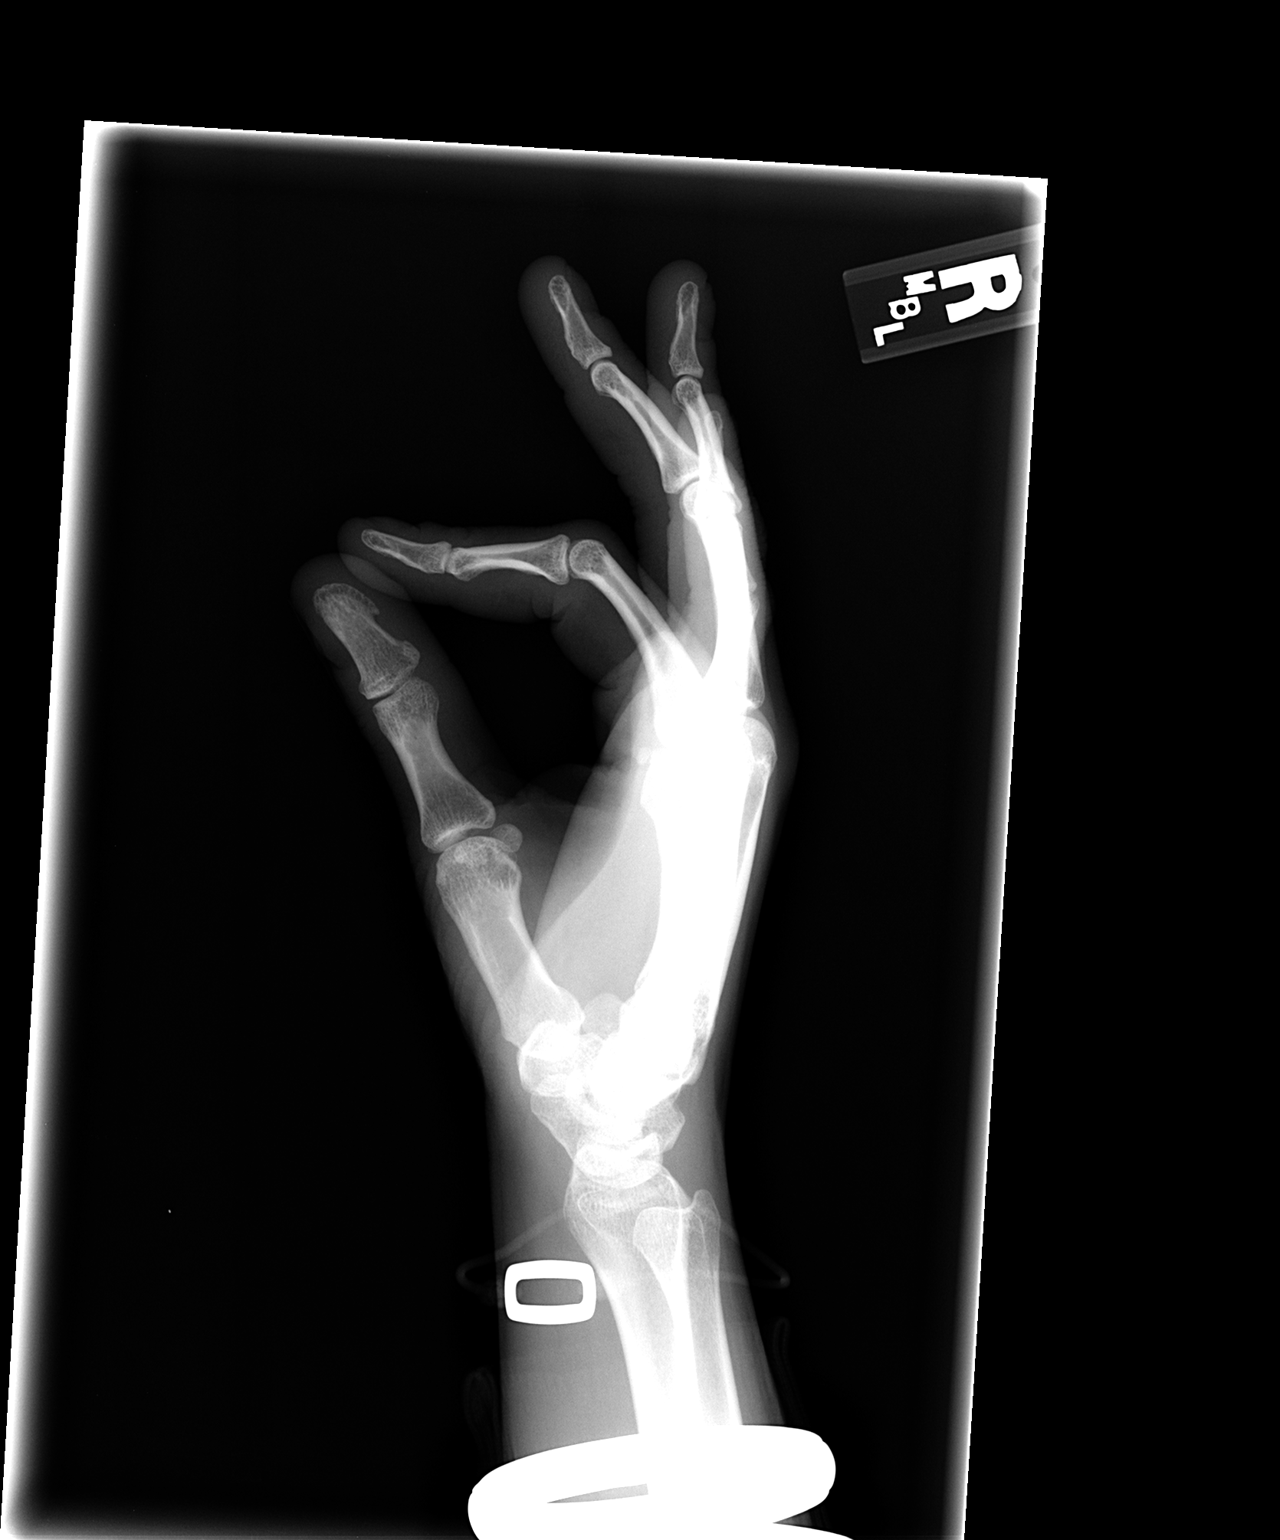

[3 of 3 positions shown; findings below may reference images not displayed]

FINDINGS: No fracture or dislocation is identified. No foreign body is noted.

IMPRESSION

No acute bony findings or foreign body identified.

## 2007-07-12 ENCOUNTER — Ambulatory Visit (HOSPITAL_COMMUNITY): Admission: RE | Admit: 2007-07-12 | Discharge: 2007-07-12 | Payer: Self-pay | Admitting: Orthopedic Surgery

## 2008-03-10 ENCOUNTER — Ambulatory Visit (HOSPITAL_COMMUNITY): Admission: RE | Admit: 2008-03-10 | Discharge: 2008-03-10 | Payer: Self-pay | Admitting: Obstetrics

## 2008-03-10 IMAGING — US US OB DETAIL+14 WK
1 series · 14 of 28 positions shown · non-contrast
Comparison: none

OBSTETRICAL ULTRASOUND:
 This ultrasound exam was performed in the [HOSPITAL] Ultrasound Department.  The OB US report was generated in the AS system, and faxed to the ordering physician.  This report is also available in [REDACTED] PACS.

[Series 1: us ob detail+14 wk · 0.31mm/px · 14 of 77 slices shown]
[im 3/77]
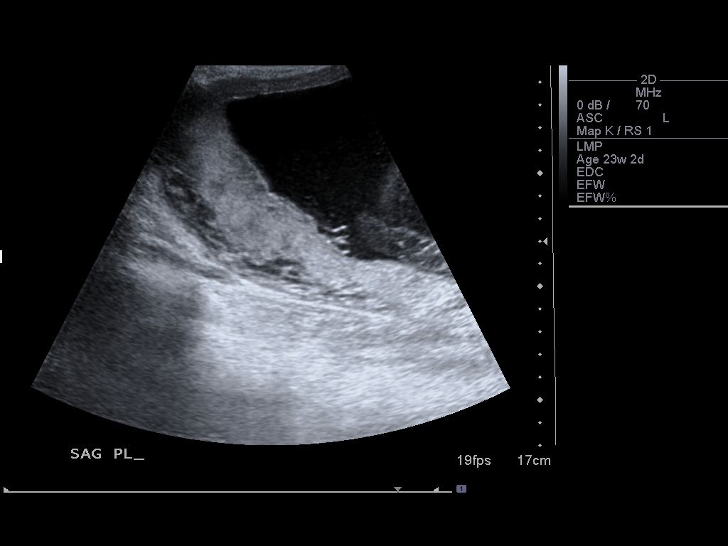
[im 9/77]
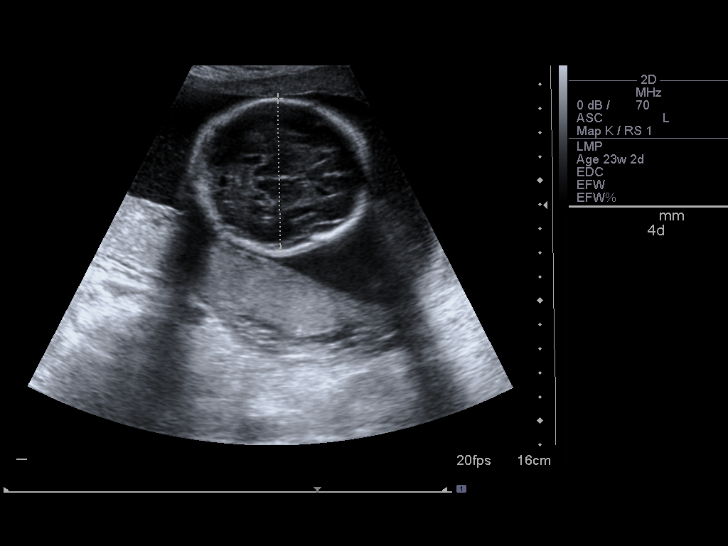
[im 15/77]
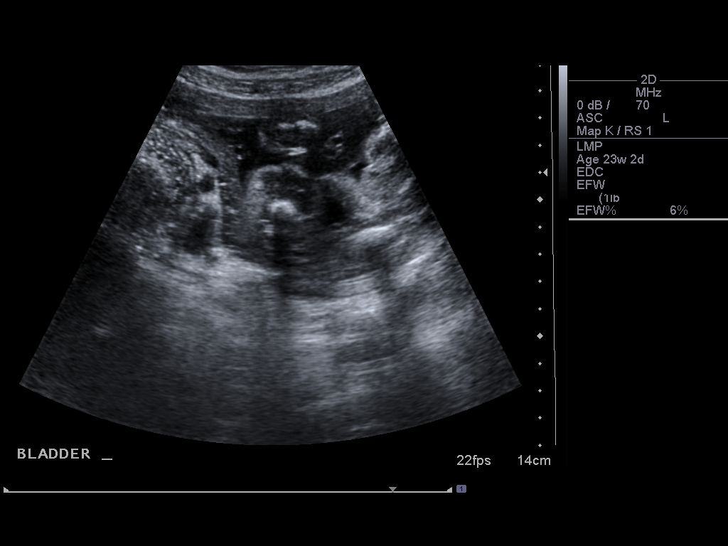
[im 20/77]
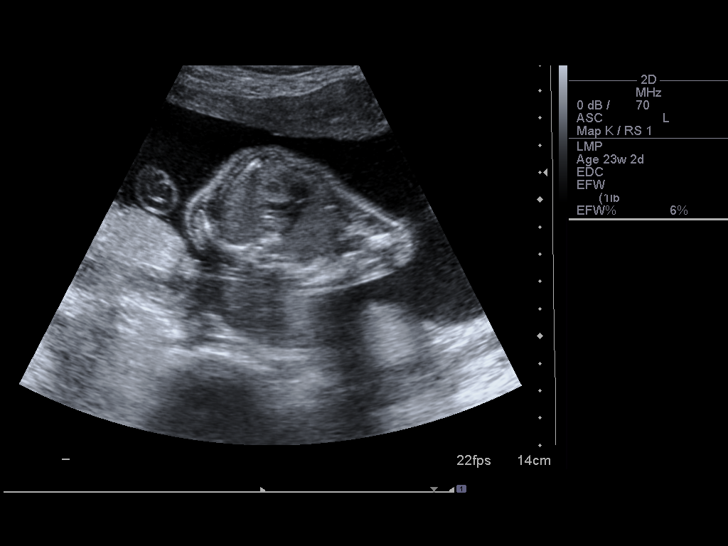
[im 26/77]
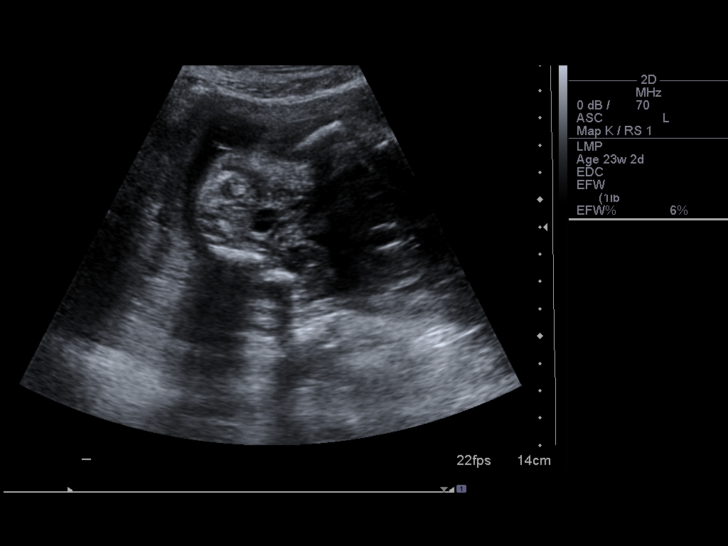
[im 31/77]
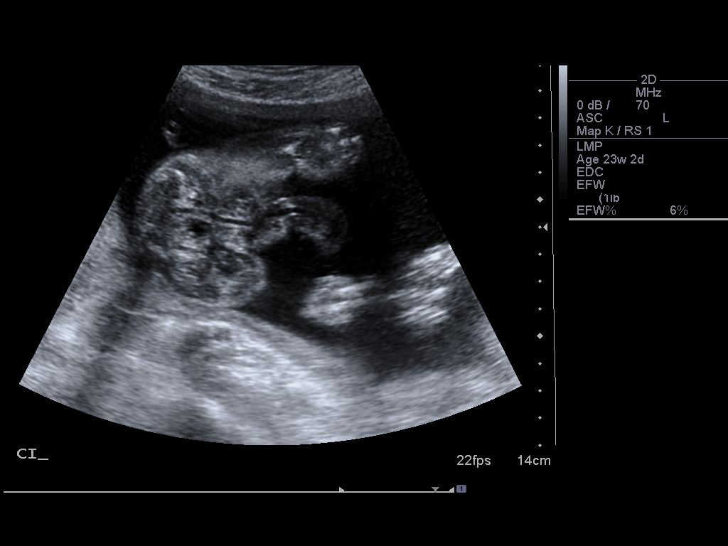
[im 37/77]
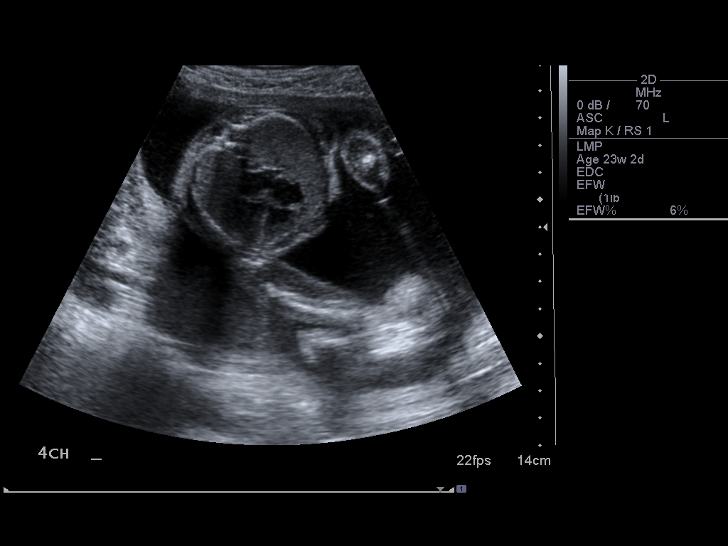
[im 43/77]
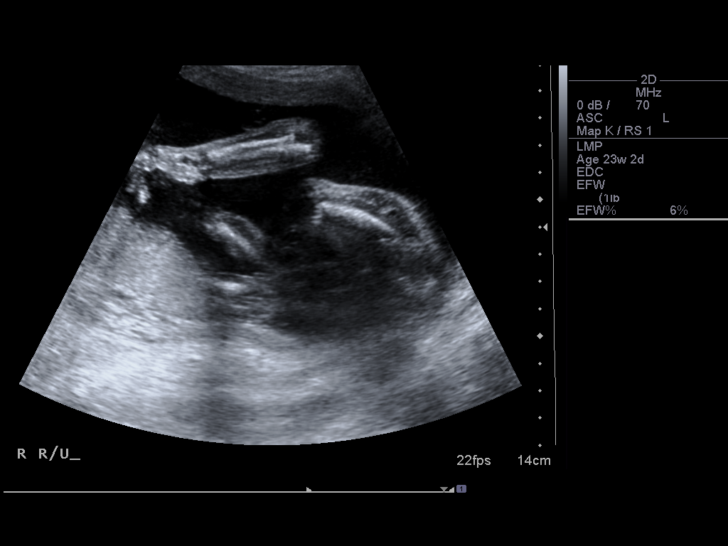
[im 48/77]
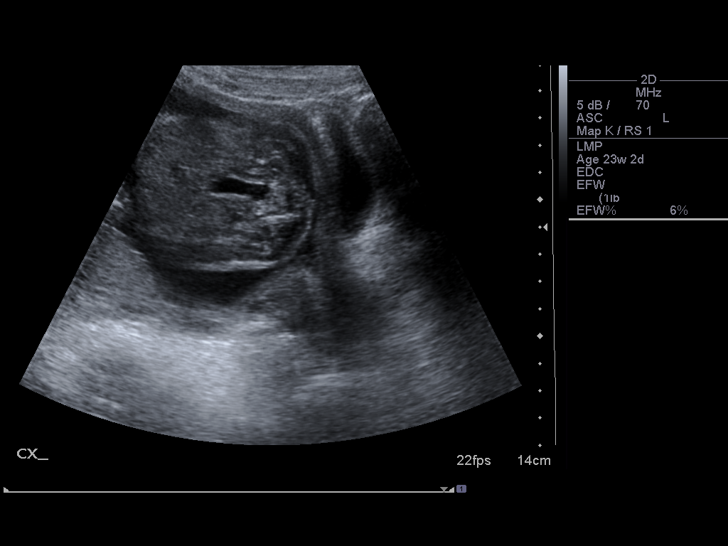
[im 54/77]
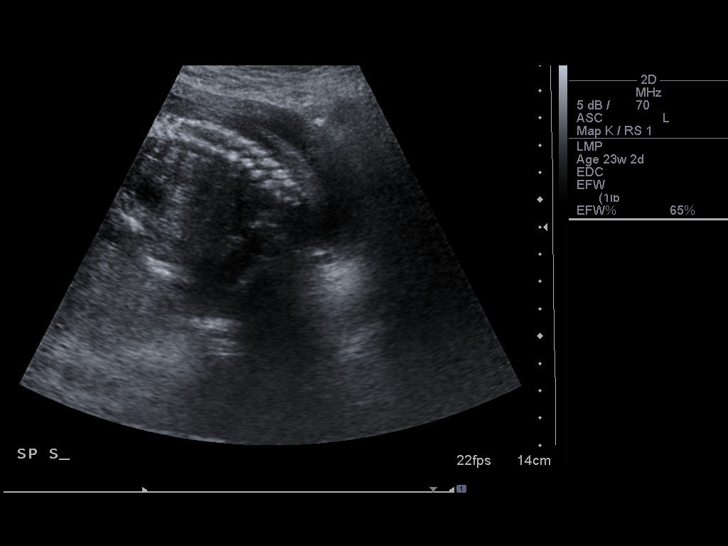
[im 60/77]
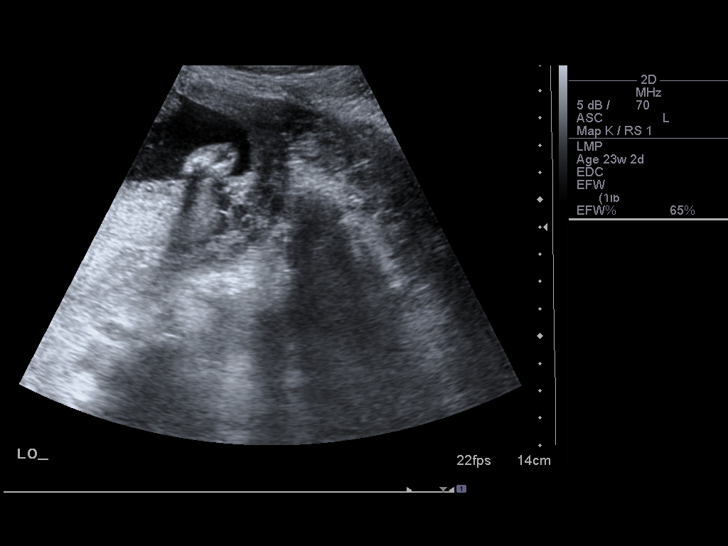
[im 65/77]
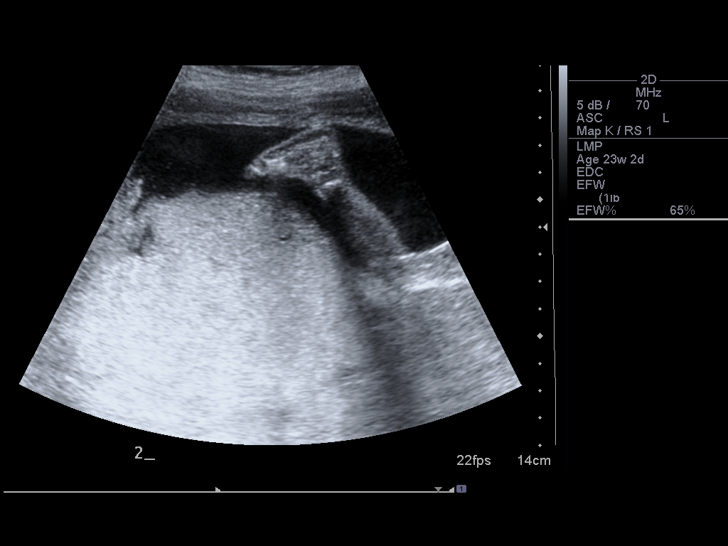
[im 71/77]
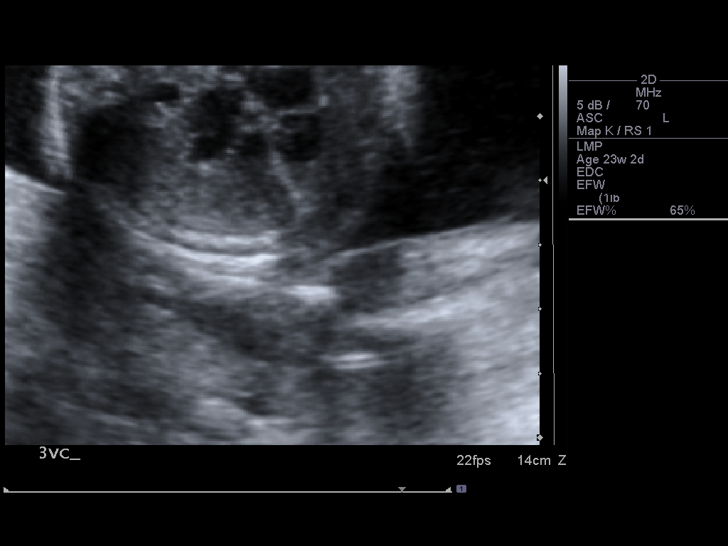
[im 77/77]
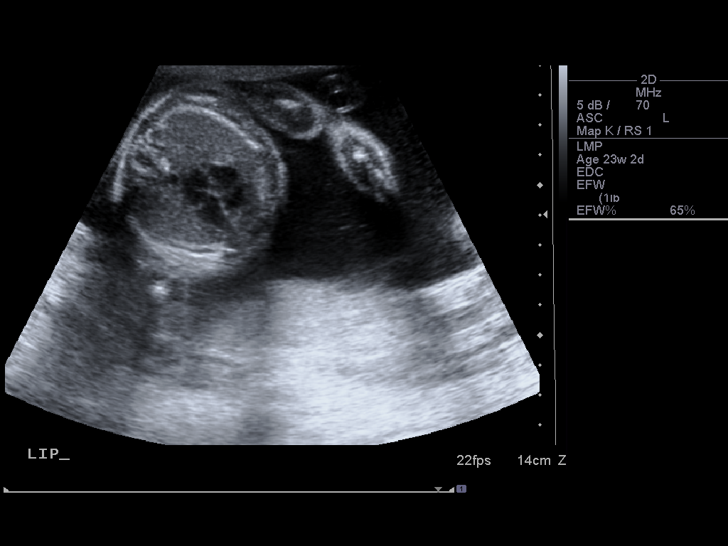

[14 of 28 positions shown; findings below may reference images not displayed]

IMPRESSION: See AS Obstetric US report.

## 2008-03-10 IMAGING — US US OB DETAIL+14 WK
1 series · 1 of 1 positions shown · non-contrast
Comparison: none

OBSTETRICAL ULTRASOUND:
 This ultrasound exam was performed in the [HOSPITAL] Ultrasound Department.  The OB US report was generated in the AS system, and faxed to the ordering physician.  This report is also available in [REDACTED] PACS.

[Series 1: us ob detail+14 wk · 0.28mm/px · 1 of 1 slices shown]
[im 1/1]
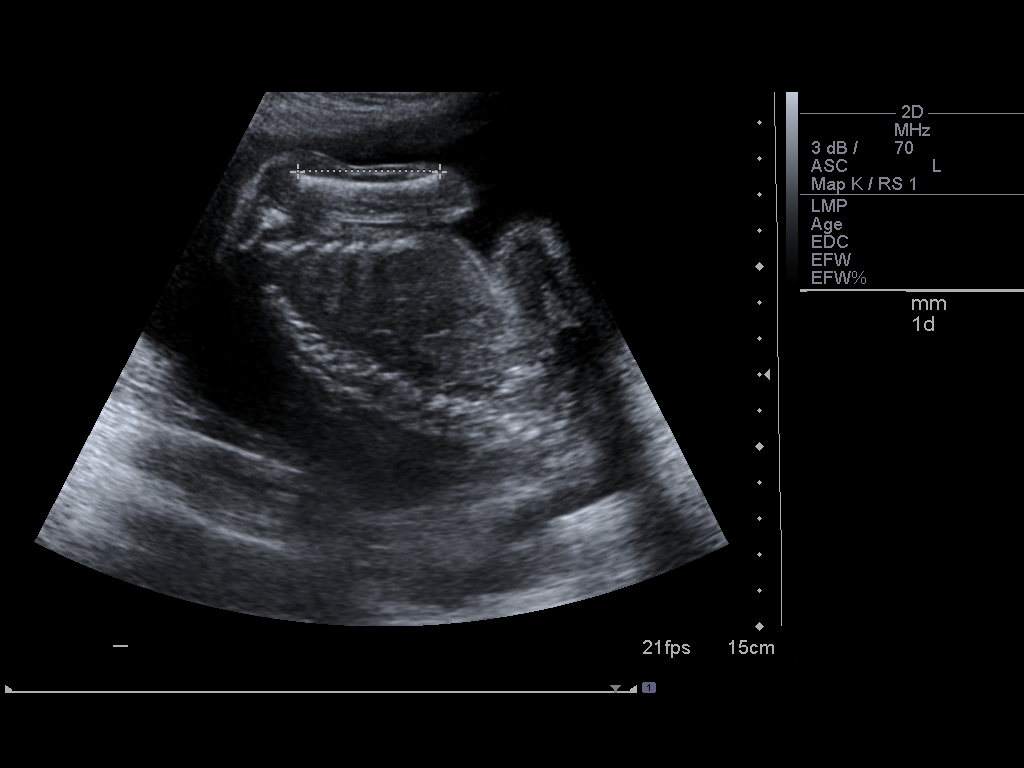

[1 of 1 positions shown; findings below may reference images not displayed]

IMPRESSION: See AS Obstetric US report.

## 2008-06-25 ENCOUNTER — Inpatient Hospital Stay (HOSPITAL_COMMUNITY): Admission: AD | Admit: 2008-06-25 | Discharge: 2008-06-28 | Payer: Self-pay | Admitting: Obstetrics & Gynecology

## 2008-06-25 ENCOUNTER — Encounter: Payer: Self-pay | Admitting: Obstetrics

## 2008-06-25 IMAGING — CR DG ABD PORTABLE 1V
1 series · 1 of 1 positions shown · non-contrast
Comparison: None available.

CLINICAL DATA: Unable to count sponges after Cesarean section.
Question foreign body.

ABDOMEN - 1 VIEW

[view not recorded]
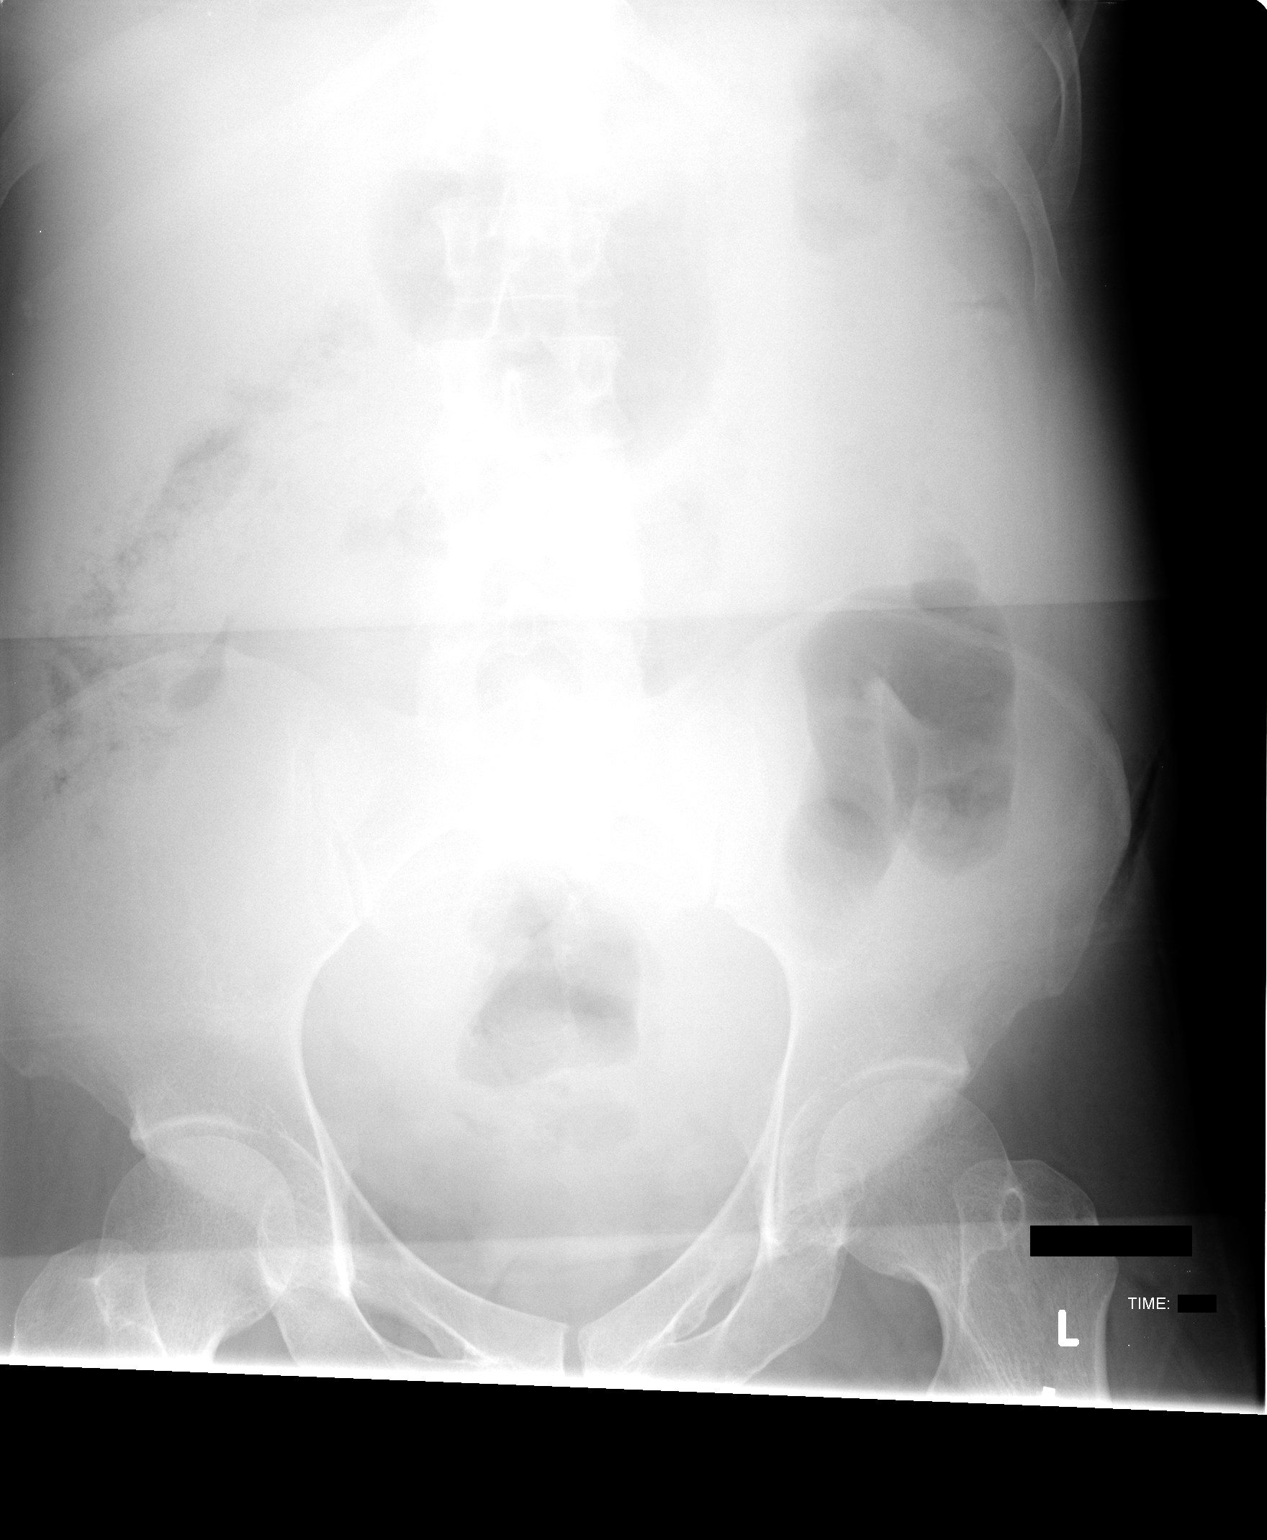

[1 of 1 positions shown; findings below may reference images not displayed]

FINDINGS: No unexpected radiopaque foreign body is identified.
IMPRESSION: Negative for foreign body.

## 2009-01-05 ENCOUNTER — Ambulatory Visit (HOSPITAL_BASED_OUTPATIENT_CLINIC_OR_DEPARTMENT_OTHER): Admission: RE | Admit: 2009-01-05 | Discharge: 2009-01-05 | Payer: Self-pay | Admitting: Urology

## 2009-02-23 ENCOUNTER — Emergency Department (HOSPITAL_COMMUNITY): Admission: EM | Admit: 2009-02-23 | Discharge: 2009-02-23 | Payer: Self-pay | Admitting: Family Medicine

## 2010-11-22 ENCOUNTER — Other Ambulatory Visit: Payer: Self-pay | Admitting: General Surgery

## 2010-11-22 ENCOUNTER — Emergency Department (HOSPITAL_COMMUNITY): Payer: Medicaid Other

## 2010-11-22 ENCOUNTER — Ambulatory Visit (HOSPITAL_COMMUNITY)
Admission: EM | Admit: 2010-11-22 | Discharge: 2010-11-24 | Disposition: A | Discharge status: Home / Self Care | Payer: Medicaid Other | Attending: General Surgery | Admitting: General Surgery

## 2010-11-22 DIAGNOSIS — K929 Disease of digestive system, unspecified: Secondary | ICD-10-CM | POA: Insufficient documentation

## 2010-11-22 DIAGNOSIS — Y838 Other surgical procedures as the cause of abnormal reaction of the patient, or of later complication, without mention of misadventure at the time of the procedure: Secondary | ICD-10-CM | POA: Insufficient documentation

## 2010-11-22 DIAGNOSIS — N739 Female pelvic inflammatory disease, unspecified: Secondary | ICD-10-CM | POA: Insufficient documentation

## 2010-11-22 DIAGNOSIS — K560 Paralytic ileus: Secondary | ICD-10-CM | POA: Insufficient documentation

## 2010-11-22 DIAGNOSIS — Y9210 Unspecified residential institution as the place of occurrence of the external cause: Secondary | ICD-10-CM | POA: Insufficient documentation

## 2010-11-22 DIAGNOSIS — K3580 Unspecified acute appendicitis: Secondary | ICD-10-CM | POA: Insufficient documentation

## 2010-11-22 LAB — COMPREHENSIVE METABOLIC PANEL
Albumin: 3.3 g/dL — ABNORMAL LOW (ref 3.5–5.2)
BUN: 8 mg/dL (ref 6–23)
Creatinine, Ser: 0.57 mg/dL (ref 0.4–1.2)
Total Protein: 6.1 g/dL (ref 6.0–8.3)

## 2010-11-22 LAB — URINALYSIS, ROUTINE W REFLEX MICROSCOPIC
Glucose, UA: NEGATIVE mg/dL
Protein, ur: 100 mg/dL — AB
pH: 8.5 — ABNORMAL HIGH (ref 5.0–8.0)

## 2010-11-22 LAB — DIFFERENTIAL
Basophils Relative: 0 % (ref 0–1)
Eosinophils Absolute: 0 10*3/uL (ref 0.0–0.7)
Lymphocytes Relative: 6 % — ABNORMAL LOW (ref 12–46)
Lymphs Abs: 0.6 10*3/uL — ABNORMAL LOW (ref 0.7–4.0)
Monocytes Absolute: 0.4 10*3/uL (ref 0.1–1.0)
Neutro Abs: 9.1 10*3/uL — ABNORMAL HIGH (ref 1.7–7.7)

## 2010-11-22 LAB — CBC
MCH: 27.6 pg (ref 26.0–34.0)
MCHC: 33.4 g/dL (ref 30.0–36.0)
Platelets: 171 10*3/uL (ref 150–400)
RBC: 4.02 MIL/uL (ref 3.87–5.11)

## 2010-11-22 LAB — URINE MICROSCOPIC-ADD ON

## 2010-11-22 LAB — POCT PREGNANCY, URINE: Preg Test, Ur: NEGATIVE

## 2010-11-22 IMAGING — CT CT ABD-PELV W/O CM
2 of 4 series · 17 of 46 positions shown, 19 images · non-contrast
Comparison: [DATE].

CLINICAL DATA: 34-year-old female with abdominal and right flank
pain.

CT ABDOMEN AND PELVIS WITHOUT CONTRAST
TECHNIQUE: Multidetector CT imaging of the abdomen and pelvis was
performed following the standard protocol without intravenous
contrast.

[Series 2: a/p w/o 5.0 b31f st · axial · non-contrast · 0.64mm/px · z∈[+850,+1220]mm · 14 of 82 slices shown, 16 images]
[im 4/82  soft-tissue]
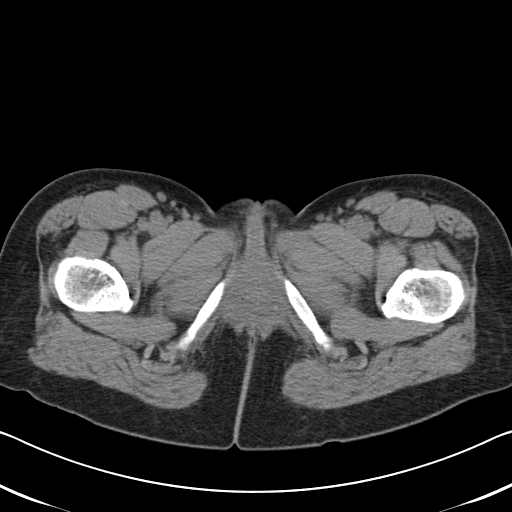
[im 4/82  bone]
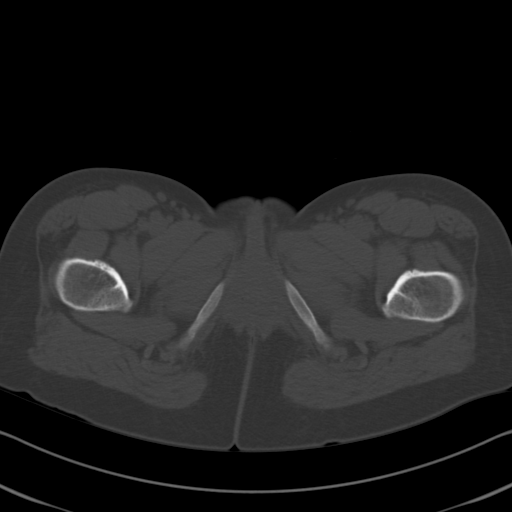
[im 11/82  soft-tissue]
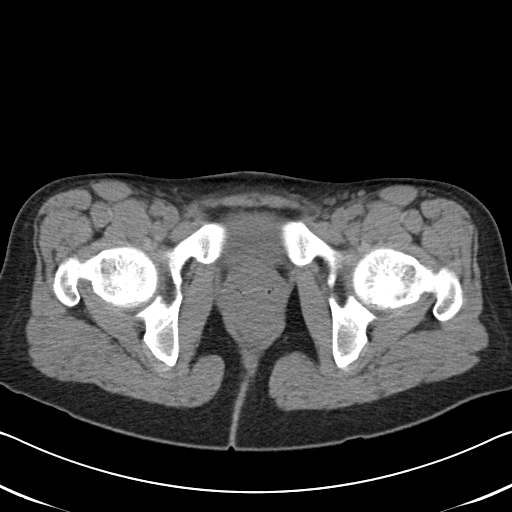
[im 17/82  soft-tissue]
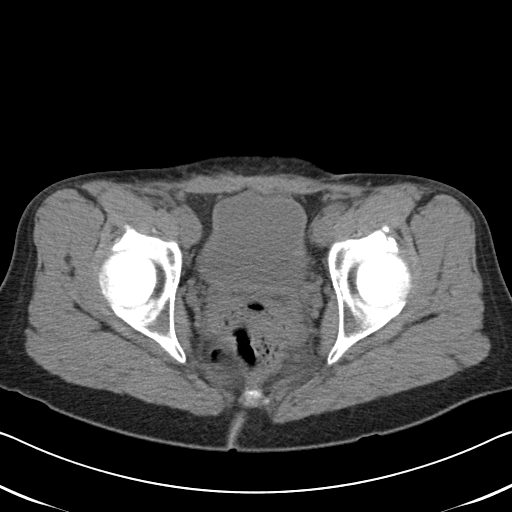
[im 21/82  soft-tissue]
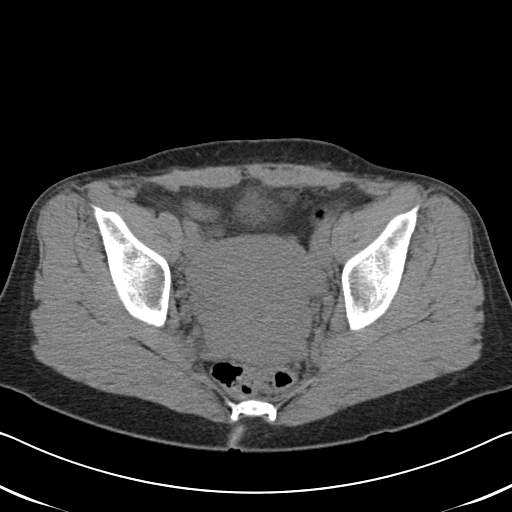
[im 28/82  soft-tissue]
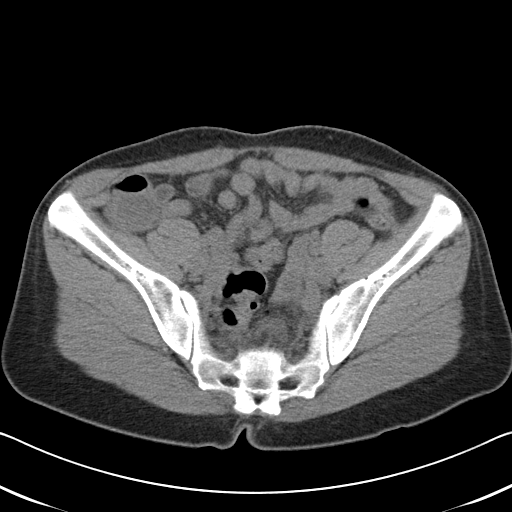
[im 34/82  soft-tissue]
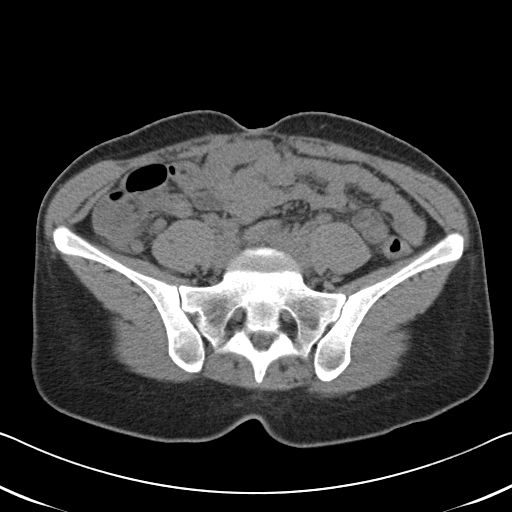
[im 38/82  soft-tissue]
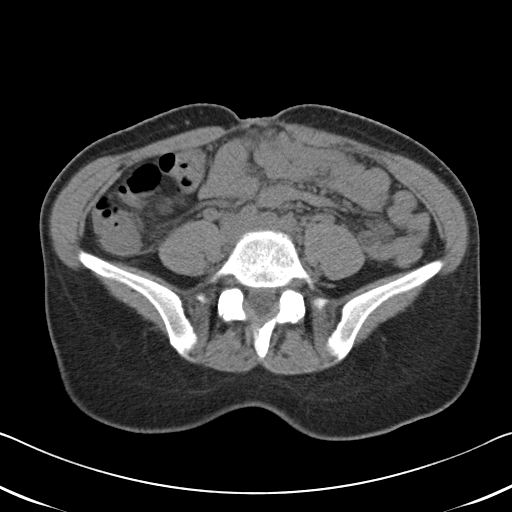
[im 44/82  soft-tissue]
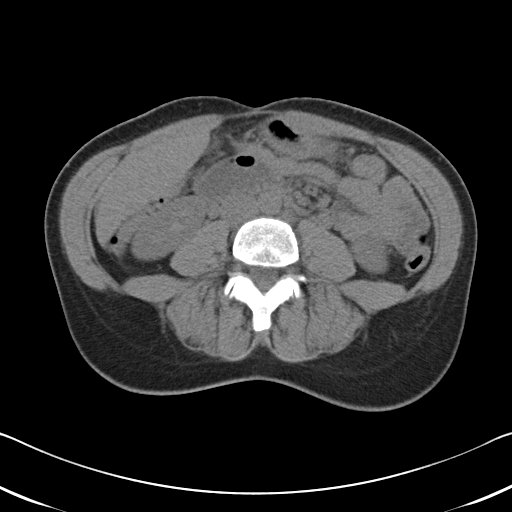
[im 48/82  soft-tissue]
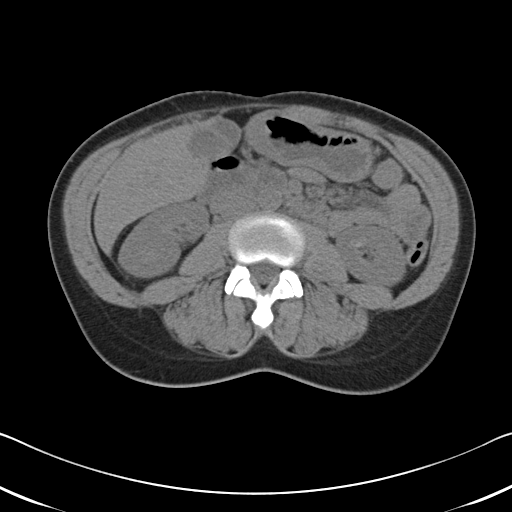
[im 48/82  bone]
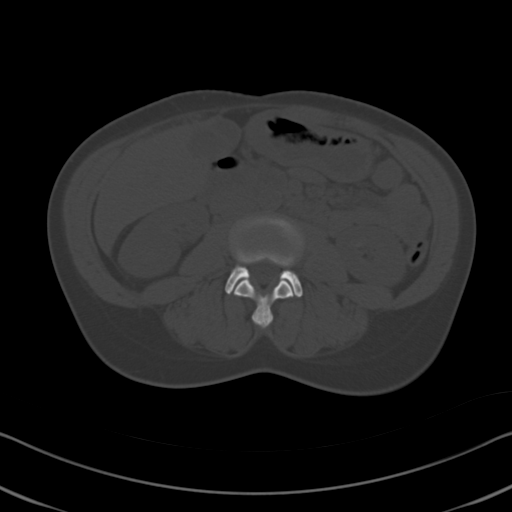
[im 55/82  soft-tissue]
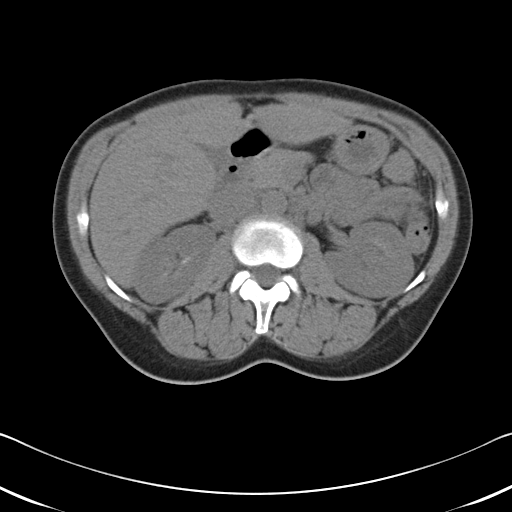
[im 61/82  soft-tissue]
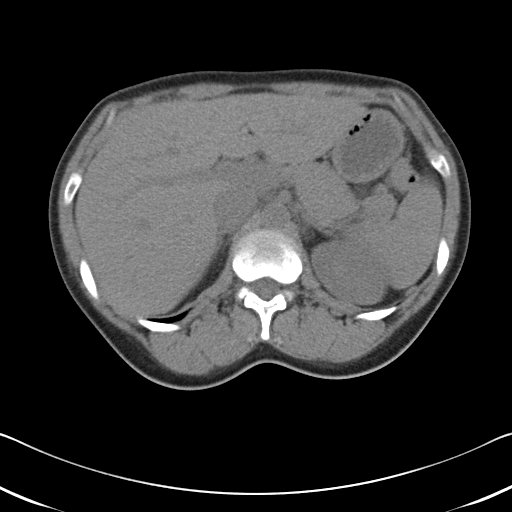
[im 65/82  soft-tissue]
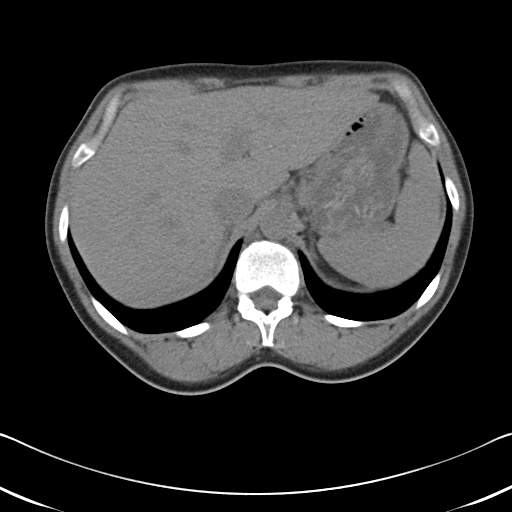
[im 71/82  soft-tissue]
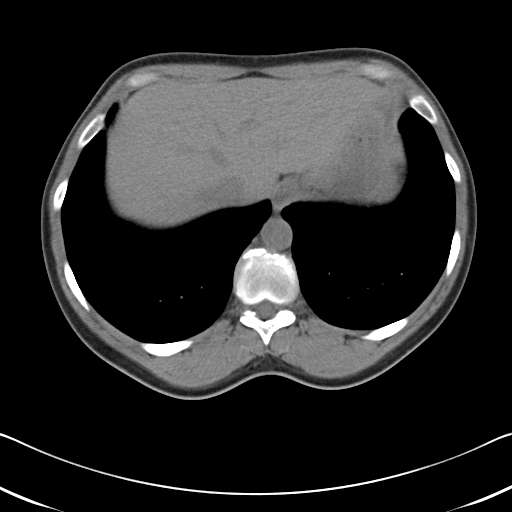
[im 78/82  soft-tissue]
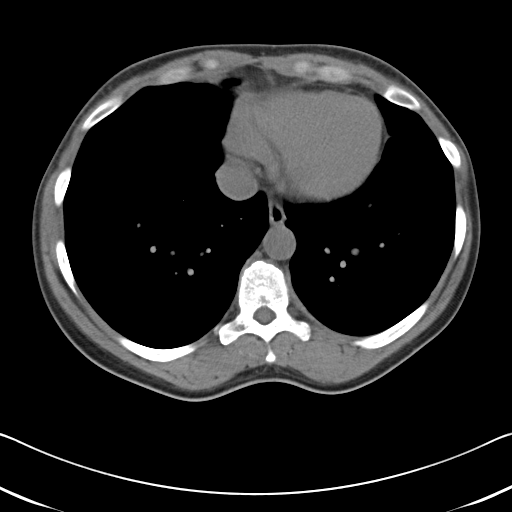

[Series 602: cor · coronal · 0.80mm/px · 3 of 121 slices shown]
[im 41/121  soft-tissue]
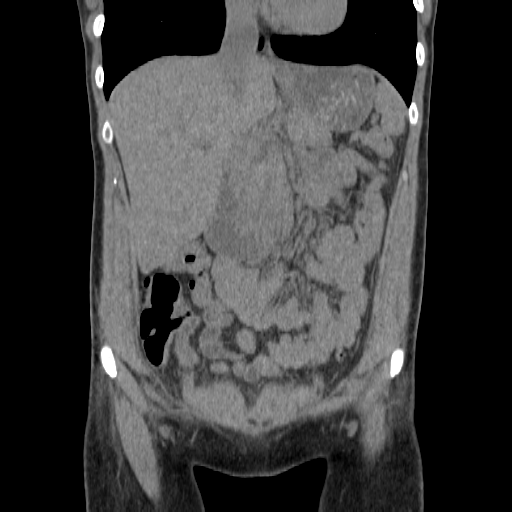
[im 54/121  soft-tissue]
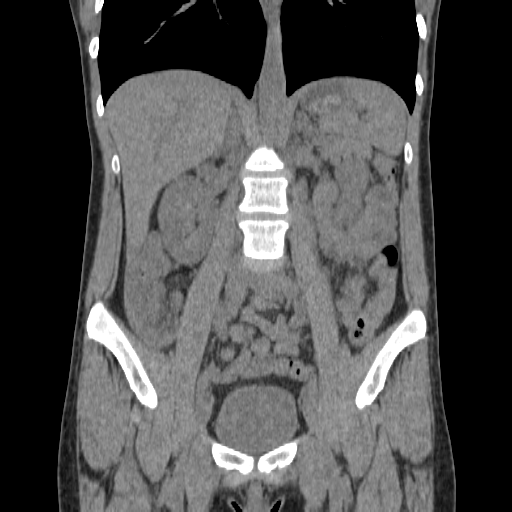
[im 67/121  soft-tissue]
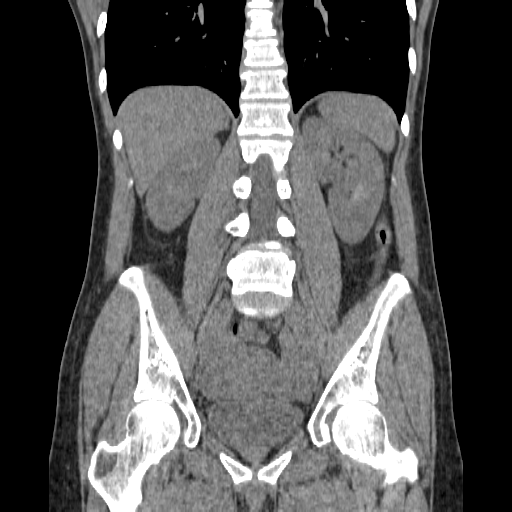

[17 of 46 positions shown; findings below may reference images not displayed]

FINDINGS: Cardiac size at the upper limits of normal.  Otherwise
normal visualized lower chest. No acute osseous abnormality
identified.

Negative noncontrast appearance of the uterus and adnexa, likely 18
mm physiologic cyst on the left.  No definite pelvic free fluid.
Negative distal colon.

The appendix is mildly dilated measuring 7-8 mm in diameter, fluid-
filled, and there is mild stranding along its posterior inferior
margin (series 2 image 49).  This is located just lateral to the
right psoas muscle and is somewhat retrocecal.  Cecum and terminal
ileum otherwise appear within normal limits.  No dilated small
bowel.  Negative noncontrast appearance of the stomach, duodenum,
liver, gallbladder, spleen, pancreas, and adrenal glands.

The renal pyramids are hyperdense, but there are no focal renal
calculi.  No hydronephrosis.  No hydroureter.  No urologic
calculus.  Bladder is unremarkable. No abdominal free fluid.
IMPRESSION: 1.  Early acute appendicitis. Retrocecal appendix.
2.  No urologic calculus or obstructive uropathy.  Hyperdense renal
pyramids may relate to dehydration.

## 2010-11-22 NOTE — Op Note (Signed)
[name], [name]            ACCOUNT NO.:  [id number]   MEDICAL RECORD NO.:  [id number]          PATIENT TYPE:  AMB   LOCATION:  [facility]                         FACILITY:  [facility]   PHYSICIAN:  Excell Seltzer. Annabell Howells, M.D.    DATE OF BIRTH:  02-21-1976   DATE OF PROCEDURE:  01/05/2009  DATE OF DISCHARGE:                               OPERATIVE REPORT   PROCEDURE:  Marsupialization of a vaginal wall cyst, cystoscopy,  hydrodistention of the bladder with installation of Pyridium and  Marcaine.   PREOPERATIVE DIAGNOSIS:  Vaginal wall cyst, urinary frequency and  urgency, rule out IC.   POSTOPERATIVE DIAGNOSIS:  Infected vaginal wall cyst and interstitial  cystitis.   SURGEON:  Excell Seltzer. Annabell Howells, MD.   ANESTHESIA:  General.   SPECIMEN:  None.   DRAIN:  None.   COMPLICATIONS:  None.   INDICATIONS:  [name] is a 35 year old African American female sent by  Dr. Clearance Coots for a vaginal wall cyst just adjacent to the meatus of the  urethra on the right.  She is also noted to have a long history of  urgency with occasional urge incontinence.  It was felt that excision of  the cyst was indicated along with a cystoscopy and hydrodistention to  evaluate for interstitial cystitis.   FINDINGS AND PROCEDURE:  The patient was taken to the operating room  where a general anesthetic was induced.  She had been given Cipro.  She  was placed in the lithotomy position.  Her perineum and genitalia were  prepped with Betadine solution.  She was draped in the usual sterile  fashion.  A weighted vaginal retractor was placed.  Inspection revealed  a bulge just to the right of the urethral meatus consistent with a cyst.  Pressure on the cyst produced no material from the urethral meatus.  The  vaginal mucosa was infiltrated with about 3 mL of 1% lidocaine with  epinephrine over the cyst and then a longitudinal incision was made over  the cyst.  The mucosa was elevated, however, during the procedure the  cyst  ruptured and it was noted to be full of purulent material.  Because  of the purulence, it was felt that excision of the cyst and closure of  the vaginal wall would be at risk for recurrent infection.  I elected to  open the cyst widely and marsupialize the edges to the surrounding  vaginal wall mucosa.  This was done with interrupted 3-0 chromic suture.  Once this portion of the procedure was completed, the area was irrigated  and a 22-French cysto-sheath was passed without difficulty.  This was  fitted with a 12-degree lens.  The urethra was inspected.  No  abnormalities were noted.  Examination of bladder revealed mild  trabeculation.  No tumors or stones were noted.  The ureteral orifices  were unremarkable.  The 70-degree lens was then inserted and the bladder  was reinspected once again without lesions.  The bladder was then  dilated under 80 cm of water pressure to capacity, which was held for 3  minutes, and the bladder was drained.  Her  capacity under anesthesia was  approximately 500 mL.  The terminal efflux was bloody.  Reinspection of  the bladder following cystoscopy  demonstrated diffuse glomerulations consistent with a diagnosis of  interstitial cystitis.  Her bladder was left empty, a B and O  suppository was placed.  She was taken down from the lithotomy position.  Her anesthetic was reversed.  She was moved to the recovery room in  stable condition.  There were no complications.      Excell Seltzer. Annabell Howells, M.D.  Electronically Signed     [id number]  D:  01/05/2009  T:  01/05/2009  Job:  [id number]   cc:   Leonette Most A. Clearance Coots, M.D.  Fax: [phone]

## 2010-11-22 NOTE — Op Note (Signed)
[name], [name]            ACCOUNT NO.:  [id number]   MEDICAL RECORD NO.:  [id number]           PATIENT TYPE:   LOCATION:                                 FACILITY:   PHYSICIAN:  Charles A. Clearance Coots, M.D.DATE OF BIRTH:  June 10, 1976   DATE OF PROCEDURE:  06/25/2008  DATE OF DISCHARGE:                               OPERATIVE REPORT   PREOPERATIVE DIAGNOSES:  Previous cesarean section x2, active labor at  term, desires repeat cesarean section.   POSTOPERATIVE DIAGNOSES:  Previous cesarean section x2, active labor at  term, desires repeat cesarean section.   PROCEDURE:  Repeat low transverse cesarean section.   SURGEON:  Charles A. Clearance Coots, MD   ASSISTANT:  Kathreen Cosier, MD   ANESTHESIA:  Spinal.   ESTIMATED BLOOD LOSS:  800 mL.   IV FLUIDS:  2300 mL.   URINE OUTPUT:  200 mL   COMPLICATIONS:  None.   Foley to gravity.   FINDINGS:  Viable female at 1727, Apgars of 8 at 1 minute and 9 at 5  minutes, weight of 6 pounds and 14 ounces.  Normal uterus, ovaries, and  fallopian tube.   OPERATION:  The patient was brought to the operating room, and after  satisfactory spinal anesthesia the abdomen was prepped and draped in the  usual sterile fashion.  A Pfannenstiel skin incision was made through  the previous scar with a scalpel down to the fascia.  Fascia was nicked  in the midline and the fascial incision was extended to the left and to  the right with curved Mayo scissors.  The superior and inferior fascial  edges were taken off the rectus muscle with both sharp and blunt  dissection.  Rectus muscle was bluntly divided in the midline.  Peritoneum was entered digitally and was digitally extended to the left  and to the right.  The bladder blade was positioned in the incision.  The vesicouterine fold of peritoneum above the reflection of the urinary  bladder was grasped with forceps and was incised and undermined with  Metzenbaum scissors.  The incision was extended to  the left and to the  right with Metzenbaum scissors.  The bladder flap was bluntly developed,  and the bladder blade was repositioned in front of the urinary bladder  placing it well out of the operative field.  The uterus was then entered  transversely in the lower uterine segment with the scalpel.  Clear  amniotic fluid was expelled.  The uterine incision was then extended to  the left and to the right with the bandage scissors.  The vertex was  then delivered with the aid of fundal pressure from the assistant.  The  infant's mouth and nose were suctioned with a suction bulb, and the  delivery was completed with the aid of fundal pressure from the  assistant.  Umbilical cord was doubly clamped and cut, and the infant  was handed off to the nursery staff.  Cord blood was obtained, and the  placenta was spontaneously expelled from the uterine cavity intact.  The  endometrial surface was thoroughly debrided with a dry  lap sponge.  The  edges of the uterine incision were grasped with ring forceps.  The  uterus was closed with a continuous interlocking suture of 0 Monocryl  from each corner to the center.  Hemostasis was excellent.  The bladder  flap was then closed with a continuous suture of 3-0 Monocryl.  Pelvic  cavity was thoroughly irrigated with warm saline solution and all clots  were removed.  The abdomen was then closed as follows:  The parietal  peritoneum was closed with continuous suture of 2-0 Monocryl.  The  fascia was closed with a continuous suture of 0 Vicryl.  The  subcutaneous tissue was thoroughly irrigated with warm saline solution  and all areas of subcutaneous bleeding were coagulated with the Bovie.  Skin was then closed with subcuticular suture of 3-0 Monocryl.  Sterile  bandage was applied to the incision closure.  Surgical technician  indicated that all needle, sponge, and instrument counts were correct  x2.  The patient tolerated the procedure well, was  transported to the  recovery room in satisfactory condition.      Charles A. Clearance Coots, M.D.  Electronically Signed     CAH/MEDQ  D:  08/18/2008  T:  08/19/2008  Job:  [id number]

## 2010-11-22 NOTE — Discharge Summary (Signed)
NAMEBELENDA, [name]            ACCOUNT NO.:  [id number]   MEDICAL RECORD NO.:  [id number]          PATIENT TYPE:  INP   LOCATION:  9303                          [facility]ACILITY:  [facility]   PHYSICIAN:  Phil D. Okey Dupre, M.D.     DATE OF BIRTH:  1976/06/05   DATE OF ADMISSION:  02/15/2007  DATE OF DISCHARGE:  02/16/2007                               DISCHARGE SUMMARY   ADMITTING DIAGNOSES:  1. Preterm premature rupture of membranes at 22 weeks and 6 days'      gestation, question placental abruption.  2. Preterm labor.   DISCHARGE DIAGNOSES:  1. Status post spontaneous vaginal delivery of a nonviable fetus.  2. Placental abruption.   SIGNIFICANT FINDINGS:  Labs on date of admission, February 15, 2007:  Urinalysis was positive for large blood, small leukocytes, urinary  glucose, and positive ketones.  Urine microscopy was positive for 3-6  white blood cells, 3-6 red blood cells, and rare bacteria.  DIC panel  was significant only for an elevated D-dimer to 1.47.  Blood typing was  done and the patient was B positive.  Obstetric panel showed a  hemoglobin of 8.9, a white blood cell count of 11.6, and a platelet  count of 268.  RPR was nonreactive and rubella test was immune.  HIV  screen was nonreactive.  On the date of discharge, February 16, 2007:  CBC  was performed and it showed a white blood cell count of 17.2, and a  hemoglobin of 7.7.  Ultrasound performed on February 15, 2007, showed no  amniotic fluid.  Gestational age of the fetus was 21 weeks and 4 days,  and a possible placental abruption.   HOSPITAL COURSE:  The patient was admitted for preterm premature rupture  of membranes, preterm labor, and possible placental abruption on February 15, 2007.  The patient was given Procardia for tocolysis and was started  on antibiotics.  At approximately 8 p.m. on the evening of February 15, 2007, the patient began to develop a regular contraction pattern despite  the administration of tocolytics and  inevitability of delivery was  observed.  The fetus was found to be nonviable.  On February 16, 2007 at  7:12 in the morning, the patient delivered a nonviable female by  spontaneous breech vaginal delivery.  The placenta was delivered at 7:18  spontaneously and sent to pathology with an intact 3-vessel cord.  No  lacerations.  Estimated blood loss was less than 500 mL.  The patient  remained stable.  Dr. Emelda Fear attended the delivery.  The delivery was  performed by Dr. __________  resident.  After delivery, the patient  received pastoral care services.  After delivery the patient remained  stable in the hospital and voiced a desire to be discharged to home.  Accordingly given that the patient was stable, had no symptoms of  pathologic blood loss, had decreased pain, was ambulating well, with  good urine output and tolerating food by mouth, she was deemed  appropriate to be discharged home at 8 hours post delivery.  The patient  was afebrile several hours prior to  the time of discharge, having a  small fever of 100.5 the night previously, treated with Tylenol and  antibiotics.  At the time of discharge, the patient's antibiotics were  discontinue and she was discharged to home.  The patient received a Depo-  Provera shot IM prior to discharge for birth control.   DISCHARGE MEDICATIONS:  1. Depo-Provera x1 IM prior discharge.  2. Motrin 600 mg by mouth every 6 hours as needed for pain.  3. Colace 100 mg by mouth b.i.d. as needed for constipation.  4. Prenatal vitamins 1 by mouth daily.   DISCHARGE INSTRUCTIONS:  1. The patient is to take medications mentioned previously.  2. The patient is to return to [facility] in 2      weeks.  She will be called with an appointment.  3. The patient is to maintain pelvic rest for 6 weeks with nothing in      the vagina over this time.  4. The patient is to avoid heavy lifting for 6 weeks as well.  5. The patient is to follow up  with her OB/GYN in 6 weeks for routine      followup.  6. The patient is to return sooner if she has any worsening symptoms      such as fever, increased bleeding, pain not controlled with Motrin,      or any other worrisome symptoms.   DISCHARGE CONDITION:  The patient is stable.      Myrtie Soman, MD      Javier Glazier. Okey Dupre, M.D.  Electronically Signed    TE/MEDQ  D:  02/16/2007  T:  02/17/2007  Job:  [id number]

## 2010-11-22 NOTE — Discharge Summary (Signed)
NAMEMALU, PELLEGRINI            ACCOUNT NO.:  [id number]   MEDICAL RECORD NO.:  [id number]          PATIENT TYPE:  INP   LOCATION:  9118                          [facility]ILITY[facility]  [facility]   PHYSICIAN:  Charles A. Clearance Coots, M.D.DATE OF BIRTH:  1975-07-14   DATE OF ADMISSION:  06/25/2008  DATE OF DISCHARGE:  06/28/2008                               DISCHARGE SUMMARY   ADMITTING DIAGNOSES:  1. Previous cesarean section x2 at term.  2. Active labor.  3. Desired repeat cesarean section.   DISCHARGE DIAGNOSES:  1. Previous cesarean section x2 at term.  2. Active labor.  3. Desired repeat cesarean section.  4. Status post repeat low transverse cesarean section on June 25, 2008.  Viable female delivered at 5:27, Apgars 9 at 1 minute and 9 at      5 minutes, weight of 3115 g, and length of 48.26 cm.  Mother and      infant discharged home in good condition.   REASON FOR ADMISSION:  A 35 year old G41, P2, black female presented to  [facility] with uterine contractions.  The patient has a  history of 2 previous cesarean sections, desired repeat cesarean  section.   PAST MEDICAL HISTORY:   SURGERY:  Swollen lymph node removed.   ILLNESSES:  None.   MEDICATIONS:  Prenatal vitamins.   ALLERGIES:  No known drug allergies.   SOCIAL HISTORY:  Single.  Positive tobacco.  Negative alcohol or  recreational drug use.   FAMILY HISTORY:  Positive for diabetes and hypertension.   PHYSICAL EXAMINATION:  GENERAL:  A well-nourished, well-developed female  in no acute distress.  She is afebrile.  VITAL SIGNS:  Stable.  LUNGS:  Clear to auscultation bilaterally.  HEART:  Regular rate and rhythm.  ABDOMEN:  Gravid and nontender.  PELVIC:  Cervix 4 cm dilated, 100% effaced, and the vertex at a -2  station.   ADMITTING LABS:  Hemoglobin 10, hematocrit 30, white blood cell count  10,500, and platelets 226,000.  RPR is nonreactive   HOSPITAL COURSE:  The patient was admitted and  underwent repeat low  transverse cesarean section on June 25, 2008.  There was no  intraoperative complications.  Postoperative course was uncomplicated.  The patient was discharged home on postop day #3 in good condition.   DISCHARGE LABS:  Hemoglobin of 9, hematocrit 27, white blood cell count  11,000, and platelets 215,000.   DISCHARGE DISPOSITION:   MEDICATIONS:  Tylox and Percocet and ibuprofen were prescribed for pain.  Continue prenatal vitamins.  Routine written instructions were given for  discharge after the cesarean section.  The patient is to call the office  for followup appointment in 2 weeks.      Charles A. Clearance Coots, M.D.  Electronically Signed     CAH/MEDQ  D:  06/28/2008  T:  06/28/2008  Job:  [id number]

## 2010-11-24 LAB — CBC
HCT: 33.7 % — ABNORMAL LOW (ref 36.0–46.0)
MCHC: 32.9 g/dL (ref 30.0–36.0)
RDW: 13.2 % (ref 11.5–15.5)

## 2010-11-25 NOTE — Discharge Summary (Signed)
NAMEJENETTE, RAYSON            ACCOUNT NO.:  [id number]   MEDICAL RECORD NO.:  [id number]          PATIENT TYPE:  INP   LOCATION:  9104                          [facility]ILITY[facility]   PHYSICIAN:  James A. Ashley Royalty, M.D.DATE OF BIRTH:  06/23/1976   DATE OF ADMISSION:  03/05/2005  DATE OF DISCHARGE:  03/09/2005                                 DISCHARGE SUMMARY   DISCHARGE DIAGNOSES:  1.  Intrauterine pregnancy 38+ weeks gestation, delivered.  2.  Previous cesarean section.  3.  Anemia, asymptomatic.   OPERATIONS/SPECIAL PROCEDURES:  Repeat low transverse cesarean section.   CONSULTS:  None.   DISCHARGE MEDICATIONS:  1.  Motrin 600 mg p.o. q.i.d. p.r.n.  2.  Percocet.  3.  Chromagen one daily.   HISTORY AND PHYSICAL:  A 35 year old gravida 2, para 1 38 weeks 2 days  gestation.  Prenatal care was complicated by smoking, discontinued March  2006.  Patient had a previous cesarean section.  She was admitted for repeat  cesarean section.  For the remainder of the history and physical please see  chart.   HOSPITAL COURSE:  Patient was admitted to [facility] [place].  Admission laboratory studies were drawn.  On May 06, 2005 she underwent  repeat low transverse cesarean section.  The procedure yielded a 6 pound 13  ounce female.  Apgars 8 at one minute, 9 at five minutes.  Sent to the newborn  nursery.  The patient's postoperative course was complicated by an  asymptomatic anemia with a hemoglobin of 6.7 on March 08, 2005.  By later  in the day the hemoglobin was up to 7.3.  On August 31 the patient was felt  to be stable for discharge and was discharged home afebrile, in satisfactory  condition.   DISPOSITION:  Patient is to return to [facility] in  four to six weeks for postpartum evaluation.           ______________________________  Rudy Jew Ashley Royalty, M.D.     JAM/MEDQ  D:  05/03/2005  T:  05/03/2005  Job:  [id number]

## 2010-11-25 NOTE — Op Note (Signed)
NAMESAMARY, SHATZ NO.:  [id number]   MEDICAL RECORD NO.:  [id number]          PATIENT TYPE:  MAT   LOCATION:  MATC                          [facility]ILITY:  [facility]   PHYSICIAN:  James A. Ashley Royalty, M.D.DATE OF BIRTH:  Dec 28, 1975   DATE OF PROCEDURE:  03/06/2005  DATE OF DISCHARGE:                                 OPERATIVE REPORT   PREOPERATIVE DIAGNOSIS:  1.  Intrauterine pregnancy at 38 weeks 2 days gestation.  2.  Previous cesarean section.  3.  Early labor.   POSTOPERATIVE DIAGNOSIS:  1.  Intrauterine pregnancy at 38 weeks 2 days gestation.  2.  Previous cesarean section.  3.  Early labor.  4.  Path pending.   PROCEDURE:  Repeat low transverse cesarean section.   SURGEON:  Rudy Jew. Ashley Royalty, M.D.   ANESTHESIA:  Spinal   FINDINGS:  6 pounds 13 ounces female, Apgars 8 at 1 minutes and 9 at 5  minutes, sent to newborn nursery.   ESTIMATED BLOOD LOSS:  800 mL.   COMPLICATIONS:  None.   PACKS DRAINS:  Foley.   COUNT:  Sponge, instrument, and needle counts reported as correct x2.   PROCEDURE:  The patient was taken to the operating room, placed in the  sitting position. After spinal anesthetic was administered she was placed in  dorsosupine position and prepped for abdominal surgery. Foley catheter was  placed. She was draped. The Pfannenstiel incision was made down to the level  of the fascia which was nicked with a knife incised transversely with Mayo  scissors. The underlying rectus muscles were separated from the fascia using  sharp and blunt dissection. The peritoneum was elevated with hemostats and  entered atraumatically with Metzenbaum scissors. Incision was extended  longitudinally. The uterus was identified and a bladder flap created by  incising the anterior uterine serosa and sharply and bluntly dissecting the  bladder inferiorly. It was held in place with a bladder blade. The uterus  was then entered through a low transverse incision using  sharp and blunt  dissection. The fluid was clear. The infant was delivered from vertex  presentation in an atraumatic manner. The infant was suctioned. The cord was  doubly clamped, cut and the infant given immediately to the awaiting  pediatrics team. Cord blood was obtained. Placenta and membranes were  removed in their entirety and submitted pathology for histologic studies.  The uterus was exteriorized. The uterus was then closed in two running  layers of #1 Vicryl. The first a running locking layer. The second was a  running, intermittently locking and imbricating layer. Two additional figure-  of-eight sutures were required to obtain hemostasis. Hemostasis was noted.  Uterus, tubes and ovaries were inspected and found to be otherwise normal.  They were returned to the abdominal cavity. Copious irrigation was  accomplished. Hemostasis was noted. The fascia was then closed with 0 Vicryl  in a running fashion. The skin was closed with staples. The patient  tolerated the procedure extremely well and was returned to the recovery room  in good condition.  ______________________________  Rudy Jew Ashley Royalty, M.D.     JAM/MEDQ  D:  03/06/2005  T:  03/06/2005  Job:  [id number]

## 2010-12-02 NOTE — Op Note (Signed)
NAMEKANIJA, REMMEL NO.:  [id number]  MEDICAL RECORD NO.:  [id number]           PATIENT TYPE:  E  LOCATION:  [facility]                         FACILITY:  [facility]  PHYSICIAN:  Mary Sella. Andrey Campanile, MD     DATE OF BIRTH:  02/20/1976  DATE OF PROCEDURE:  11/22/2010 DATE OF DISCHARGE:                              OPERATIVE REPORT   PREOPERATIVE DIAGNOSIS:  Acute appendicitis.  POSTOPERATIVE DIAGNOSES: 1. Acute appendicitis. 2. Probable pelvic inflammatory disease.  PROCEDURE:  Laparoscopic appendectomy.  SURGEON:  Mary Sella. Andrey Campanile, MD  ASSISTANT SURGEON:  None.  ANESTHESIA:  General plus 9 mL of 0.25% Marcaine with epinephrine.  SPECIMEN:  Appendix and 1 cm of cecum.  FINDINGS:  The patient had a retrocecal appendix, which was stuck to the posterior aspect of the cecum.  It was inflamed.  It was nonperforated. She also had bile and strings between the liver.  The left and right lobe to the anterior abdominal wall consistent with pelvic inflammatory disease.  The uterus appeared normal except for maybe a small fibroid on the right.  There was trace amount of ooze from the staple line. Therefore, I placed a piece of surgical snow to indicate it.  ESTIMATED BLOOD LOSS:  Minimal.  INDICATIONS FOR PROCEDURE:  The patient is a 35 year old female that developed abdominal discomfort on Monday.  It began in her right side and was persistent.  The pain was constant.  It worsened actually prompting her to call the EMS.  She had nausea and vomiting.  She came to the ER where she was found to have an inflamed or enlarged appendix on CT scan with some periappendiceal stranding.  She also had point tenderness in her right lower quadrant.  I recommended laparoscopic appendectomy.  We discussed the risks and benefits of the procedure in detail including bleeding, infection, injury to surrounding structures, need to convert to an open procedure, failure to ameliorate her pain, DVT  occurrence, wound complications, and abscess formation as well as the typical postoperative course.  DESCRIPTION OF PROCEDURE:  After obtaining informed consent, the patient was brought to the operating room, placed supine on the operating table. General endotracheal anesthesia was established.  Sequential compression devices were placed.  A Foley catheter was placed.  She received Unasyn in the emergency room within 1-1/2 hours of skin incision.  Abdomen was prepped and draped in usual standard surgical fashion with ChloraPrep.  A surgical time-out had been performed.  Local was infiltrated at the base of umbilicus.  Next, a 2-cm vertical infraumbilical incision was made with a #11 blade.  The fascia was grasped and lifted anteriorly. Next, the fascia was incised with #11 blade.  Pursestring suture consisting of 0 Vicryl and the UR6 needle was placed around the fascial edges, and the Hasson trocar was introduced, and pneumoperitoneum was smoothly established up to a patient pressure of 15 mmHg.  The laparoscope was advanced.  She had some adhesions of her omentum to the right pelvic sidewall.  She also had bile and strings between on her right and left and it was in the anterior abdominal wall.  She also  had one piece of omentum stuck to the lower midline.  I placed a 5-mm trocar in the left lower quadrant and a 5-mm trocar in the suprapubic area through her old Pfannenstiel incision.  All under direct visualization after local had been infiltrated.  Using atraumatic bowel graspers and a Harmonic scalpel, I took down the right omental attachments, which were adhesed to the right pelvic sidewall as well as the omental band in the lower midline.  We then rotated the cecum medially.  The cecum was floppy.  We visualized the appendix, which was retrocecal in nature.  It was stuck to the back wall of the cecum.  Using atraumatic graspers, I was able to peel the appendix away from the cecum  and develop a plane peritoneal attachments between the appendix to the right lateral abdominal wall were taken down with Harmonic scalpel.  I then focused at the base because the mesoappendix was still somewhat attached to the posterior wall of the cecum.  There was a window at the base of the appendix.  Using [employer], I created small window at the base of the appendix.  Then, using a laparoscopic articulating 45-mm GIA stapler with a 45 blue load, I stapled across the base of the appendix incorporating just a trace amount of cecum.  This freed the appendiceal base.  I then took down the mesoappendix in a serial fashion with harmonic scalpel essentially hugging the appendix.  There was one small area of the bowel 1 cm where the mesoappendix was really plastered to the cecum.  Again, I stayed as close to the appendix as I could.  I eventually freed the appendix.  An endobag was advanced into the abdominal cavity and the appendix was placed within the bag and the specimen in its entirety along with the bag was extracted and placed on the Mayo.  The [facility] trocar was replaced and pneumoperitoneum was reestablished.  I inspected the staple line.  There was a little bit of ooze from one spot and I elected to keep an eye on that.  I then looked at the posterior aspect of the cecum where the mesoappendix had been really adhered and approximately 3 mm area on the posterior wall of the cecum that appeared to have a Harmonic thermal injury in the wall appeared intact.  However, I elected to wedge this area out.  I exchanged the 5-mm suprapubic trocar to a 12-mm trocar under direct visualization.  I then got a new load on the articulating stapler with a blue load.  Then using a snub-nose grasper, I was able to pick up the posterolateral aspect of the cecal wall that had a thermal injury that was approximately 3 mm in length.  A portion in the cecum was very large and floppy, and I was able  to bring up that 3 mm segment up through the jaws of the stapler that I had introduced through the suprapubic trocar and fired across just underneath that area of thermal injury enough since taking out 1 cm of cecum, the stapler was fired, and the staple line was intact.  Again, there was one spot area that had a little bit of oozing from it.  I irrigated the abdomen with saline.  I reinspected the staple line.  There were no signs of leak.  There was one little area that had little bit of ooze.  Therefore, I placed a piece of [employer] surgical snow and pneumoperitoneum was released.  Hasson trocar was removed.  I tied down the previously placed pursestring suture at the umbilicus.  We reestablished pneumoperitoneum, inspected the umbilical closure, it was airtight, and there was nothing within our closure.  I then removed the suprapubic and the left lower quadrant trocar.  I reapproximated the fascia and the suprapubic trocar with 0 Vicryl.  Skin incisions were closed with 4-0 Monocryl in subcuticular fashion followed by application of Dermabond.  Foley catheter was removed.  All needle, instrument, and sponge counts were correct x2.  The patient was extubated and taken to recovery room in stable addition.     Mary Sella. Andrey Campanile, MD  EMW/MEDQ  D:  11/22/2010  T:  11/22/2010  Job:  [id number]  Electronically Signed by Gaynelle Adu M.D. on 12/02/2010 09:18:22 AM

## 2010-12-02 NOTE — H&P (Signed)
NAMEROBI, MITTER NO.:  [id number]  MEDICAL RECORD NO.:  [id number]           PATIENT TYPE:  E  LOCATION:  [facility]                         FACILITY:  [facility]  PHYSICIAN:  Mary Sella. Andrey Campanile, MD     DATE OF BIRTH:  [birth year]  DATE OF ADMISSION:  11/22/2010 DATE OF DISCHARGE:                             HISTORY & PHYSICAL   ADMITTING PHYSICIAN:  Mary Sella. Andrey Campanile, MD.  ADMITTING SERVICE:  [facility]-MD.  REFERRING PHYSICIAN:  Dr. Lynelle Doctor.  OB/GYN:  [facility].  CHIEF COMPLAINT:  Abdominal pain.  REASON FOR ADMISSION:  Acute appendicitis.  HISTORY OF PRESENT ILLNESS:  The patient is a 35 year old African American female, who says she has been bloated for about 2 days, initially attributed to onset of her menstrual period that she started to feel "faint" yesterday afternoon.  After eating dinner last night, she developed acute onset of crampy right lower quadrant pain that has been constant ever since and actually getting worse.  She initially described it as "steady and tender".  She denies any radiation.  She denies any prior symptoms.  Has been associated with nausea and emesis since the pain started last night.  She has also developed some loose stools.  There has been no sick contacts and no sick family members.  As the pain was worsening, she called EMS and was brought to the emergency room for further evaluation.  She denies any dysuria, melena, hematochezia, weight loss, hematuria, or STDs.  She says she normally has a menstrual period rather every other month.  She has this pain than any type of discomfort she has had in the past.  PAST MEDICAL HISTORY: 1. Ovarian cyst. 2. History of cervical dysplasia. 3. Interstitial cystitis. 4. Vaginal wall cyst.  PAST SURGICAL HISTORY: 1. C-section x3. 2. Colposcopy.  MEDICATIONS:  She has birth control rod in her arm, otherwise, takes no oral medications.  ALLERGIES:  No known drug allergies.  FAMILY  HISTORY:  Includes diabetes, hypertension, glaucoma.  SOCIAL HISTORY:  She smokes about two cigarettes a day.  She drinks alcohol on occasional basis.  She denies any drugs.  She has three kids.  REVIEW OF SYSTEMS:  A comprehensive 12-point review of systems was performed.  All systems are negative except what is mentioned in the HPI.  PHYSICAL EXAM:  VITAL SIGNS:  Temperature 97.6, heart rate 85, blood pressure 92/56, respirations 18, satting 100% on room air. GENERAL:  She is a healthy-appearing African American female, who is sitting upright in the stretcher, who is nontoxic-appearing (should be noted that the patient has received 1 mg Dilaudid within 30 minutes of my arrival). HEENT:  Atraumatic, normocephalic.  Pupils are equal.  No scleral icterus.  No external ear lesions.  Hearing is grossly normal. NECK:  Supple.  Trachea is midline. PULMONARY:  Lungs are clear.  Symmetric chest rise.  No accessory use of muscles. CARDIOVASCULAR:  Regular rhythm.  2+ radial pulse. ABDOMEN:  Soft, nondistended.  Positive bowel sounds.  She has a well- healed Pfannenstiel incision.  She has got a prune belly from having multiple pregnancies, however, she has got definite right  lower quadrant and suprapubic tenderness.  She is most tender in the right lower quadrant and she is tender at McBurney's point.  There is positive straight leg test.  There is some subtle guarding, but there is no rebound.  There is no generalized peritonitis.  No signs of incisional hernia. MUSCULOSKELETAL:  Free range of motion.  Moves all extremities. SKIN:  No jaundice.  No rash.  No edema. NEURO:  Nonfocal.  Sensation grossly intact. PSYCHIATRIC:  Alert and oriented x3.  Judgment and insight appears appropriate.  LABS:  Sodium 138, potassium 3.8, chloride 108, bicarb 24, BUN 8, creatinine 0.57, blood sugar 130, calcium 8.6, total bilirubin 1, AST 17, ALT 9, alk phos 44.  Urine pregnancy negative.  Lipase 13.   White count 10.1, hemoglobin 11, hematocrit 33, platelet count 171. Urinalysis shows 40 ketones, 100 protein, small leukocyte esterase, many epithelials.  RADIOGRAPHS:  Noncontrast CT of abdomen and pelvis was reviewed by me as well as the radiologist.  The appendix appears to be retrocecal, it is mildly dilated to about 7-8 mm.  There is a little bit of stranding around the inferior margin.  The surrounding bowel including the colon and small bowel was normal.  There is no hydronephrosis.  There is no evidence of renal calculi.  There is no signs of free air or fluid in the pelvis.  IMPRESSION:  A 35 year old African American female with history of cervical dysplasia, interstitial cystitis, anemia, and right lower quadrant pain, and CT findings suggestive of acute appendicitis.  PLAN:  Her story is not classic for acute appendicitis given the subtle abnormalities on the CT scan, her physical exam findings, overall, this is consistent and concerning for acute appendicitis.  I have recommended admission to the hospital, maintaining n.p.o. status, starting IV fluids.  Continue IV antibiotics, which was already started by the emergency department.  I also recommended going to the operating room for laparoscopic appendectomy.  We discussed the risks and benefits of surgery.  We also talked about the possibility of benign appendix and failure to ameliorate her abdominal pain.  The patient has elected to proceed to the operating room for laparoscopic appendectomy, possible open, and she will be admitted to the hospital afterwards.     Mary Sella. Andrey Campanile, MD     EMW/MEDQ  D:  11/22/2010  T:  11/22/2010  Job:  [id number]  Electronically Signed by Gaynelle Adu M.D. on 12/02/2010 09:18:12 AM

## 2010-12-06 NOTE — Discharge Summary (Signed)
  [name], RENS            ACCOUNT NO.:  [id number]  MEDICAL RECORD NO.:  [id number]           PATIENT TYPE:  O  LOCATION:  5121                         FACILITY:  [facility]  PHYSICIAN:  Sharlet Salina T. Raini Tiley, M.D.DATE OF BIRTH:  07-10-1976  DATE OF ADMISSION:  11/22/2010 DATE OF DISCHARGE:  11/24/2010                              DISCHARGE SUMMARY   ADMITTING SURGEON:  Mary Sella. Andrey Campanile, MD  OBSTETRIC/GYNECOLOGICAL:  [facility].  DISCHARGE DIAGNOSES: 1. Acute appendicitis. 2. Evidence of pelvic inflammatory disease on laparoscopy. 3. History of interstitial cystitis. 4. History of ovarian cyst. 5. History of cervical dysplasia.  PAST SURGERIES:  C-section x2 and colposcopy.  PROCEDURES DURING THIS HOSPITALIZATION:  Laparoscopic appendectomy per Dr. Gaynelle Adu with findings of acute appendicitis without perforation and probable pelvic inflammatory disease.  HISTORY OF PRESENT ILLNESS:  This is a 35 year old African American female who had been feeling bloated for a couple of days and initially attributed this to the onset of her menstrual period, but then she started feeling faint and following an evening meal prior to presentation she developed acute onset of crampy right lower abdominal pain that has been fairly constant and getting progressively worse.  She has had some nausea with emesis that started following this more acute pain last evening.  She also developed some loose stools.  She presented to the emergency room and workup at this time including a noncontrast CT scan of the abdomen and pelvis showed the appendix to be retrocecal and mildly dilated.  There was a little bit of stranding about the inferior margin.  There was no free air and otherwise negative CT scan.  It was felt the patient had probable appendicitis and the very least needed laparoscopic evaluation.  She was taken to the OR by Dr. Andrey Campanile and did indeed have an acute appendicitis and  underwent a laparoscopic appendectomy.  Her exam also revealed some probable pelvic inflammatory changes and the patient will need to follow up with her OB/GYN concerning this.  Postoperatively, she had a mild ileus which has resolved by the time of discharge and she is tolerating a regular diet and she is to be discharged later today.  DIET:  Regular.  MEDICATIONS:  Percocet 5/325 mg 1 p.o. q.4 h. p.r.n. pain, #30, no refill.  She will follow up with [facility] on December 10, 2010, or sooner should she have difficulties in the interim.     Shawn Rayburn, P.A.   ______________________________ Lorne Skeens. Derk Doubek, M.D.    SR/MEDQ  D:  11/24/2010  T:  11/25/2010  Job:  [id number]  Electronically Signed by Lazaro Arms P.A. on 11/25/2010 09:53:23 AM Electronically Signed by Glenna Fellows M.D. on 12/06/2010 10:05:49 AM

## 2010-12-13 ENCOUNTER — Encounter (INDEPENDENT_AMBULATORY_CARE_PROVIDER_SITE_OTHER): Payer: Self-pay | Admitting: General Surgery

## 2011-04-14 LAB — CBC
Hemoglobin: 9.2 g/dL — ABNORMAL LOW (ref 12.0–15.0)
MCHC: 33.9 g/dL (ref 30.0–36.0)
Platelets: 215 10*3/uL (ref 150–400)
Platelets: 226 10*3/uL (ref 150–400)
RBC: 3.44 MIL/uL — ABNORMAL LOW (ref 3.87–5.11)
RDW: 14.9 % (ref 11.5–15.5)

## 2011-04-14 LAB — RPR: RPR Ser Ql: NONREACTIVE

## 2011-04-14 LAB — GLUCOSE, CAPILLARY: Glucose-Capillary: 105 mg/dL — ABNORMAL HIGH (ref 70–99)

## 2011-04-21 LAB — COMPREHENSIVE METABOLIC PANEL
ALT: 9
AST: 14
Alkaline Phosphatase: 63
CO2: 27
Chloride: 105
GFR calc non Af Amer: >60
Glucose, Bld: 104 — ABNORMAL HIGH
Potassium: 3.4 — ABNORMAL LOW
Sodium: 138
Total Bilirubin: 0.8

## 2011-04-21 LAB — CBC
Hemoglobin: 8.1 — ABNORMAL LOW
MCHC: 32.4
RBC: 3.14 — ABNORMAL LOW
WBC: 14.5 — ABNORMAL HIGH

## 2011-04-21 LAB — URINALYSIS, ROUTINE W REFLEX MICROSCOPIC
Glucose, UA: NEGATIVE
Ketones, ur: 40 — AB
Leukocytes, UA: NEGATIVE
Nitrite: NEGATIVE
Protein, ur: 30 — AB

## 2011-04-21 LAB — DIFFERENTIAL
Basophils Relative: 1
Eosinophils Absolute: 0.1
Eosinophils Relative: 1
Neutrophils Relative %: 90 — ABNORMAL HIGH

## 2011-04-21 LAB — URINE MICROSCOPIC-ADD ON

## 2011-04-21 LAB — LIPASE, BLOOD: Lipase: 14

## 2011-04-24 LAB — URINALYSIS, ROUTINE W REFLEX MICROSCOPIC
Protein, ur: NEGATIVE
Specific Gravity, Urine: 1.02
Urobilinogen, UA: 2 — ABNORMAL HIGH

## 2011-04-24 LAB — DIFFERENTIAL
Eosinophils Absolute: 0
Lymphocytes Relative: 8 — ABNORMAL LOW
Lymphs Abs: 0.9
Monocytes Relative: 7
Neutro Abs: 9.9 — ABNORMAL HIGH
Neutrophils Relative %: 85 — ABNORMAL HIGH

## 2011-04-24 LAB — CROSSMATCH: ABO/RH(D): B POS

## 2011-04-24 LAB — STREP B DNA PROBE: Strep Group B Ag: POSITIVE

## 2011-04-24 LAB — CBC
MCHC: 33.3
MCV: 82
MCV: 82.3
Platelets: 268
RBC: 2.8 — ABNORMAL LOW
RBC: 3.21 — ABNORMAL LOW
RDW: 14.4 — ABNORMAL HIGH
WBC: 11.6 — ABNORMAL HIGH

## 2011-04-24 LAB — URINE DRUGS OF ABUSE SCREEN W ALC, ROUTINE (REF LAB)
Barbiturate Quant, Ur: NEGATIVE
Benzodiazepines.: NEGATIVE
Cocaine Metabolites: NEGATIVE
Ethyl Alcohol: <5
Marijuana Metabolite: POSITIVE — AB
Methadone: NEGATIVE
Phencyclidine (PCP): NEGATIVE

## 2011-04-24 LAB — URINE MICROSCOPIC-ADD ON

## 2011-04-24 LAB — RUBELLA SCREEN: Rubella: 56.3 — ABNORMAL HIGH

## 2011-04-24 LAB — DIC (DISSEMINATED INTRAVASCULAR COAGULATION)PANEL
D-Dimer, Quant: 1.47 — ABNORMAL HIGH
Fibrinogen: 399
Smear Review: NONE SEEN
aPTT: 33

## 2011-04-24 LAB — HEPATITIS B SURFACE ANTIGEN: Hepatitis B Surface Ag: NEGATIVE

## 2011-04-24 LAB — RPR: RPR Ser Ql: NONREACTIVE

## 2011-04-24 LAB — LUPUS ANTICOAGULANT PANEL
DRVVT: 38.3 (ref 36.1–47.0)
PTT Lupus Anticoagulant: 59 — ABNORMAL HIGH (ref 36.3–48.8)
PTTLA 4:1 Mix: 61.1 — ABNORMAL HIGH (ref 36.3–48.8)
PTTLA Confirmation: 18.6 — ABNORMAL HIGH (ref <8.0)

## 2011-04-24 LAB — CARDIOLIPIN ANTIBODIES, IGG, IGM, IGA: Anticardiolipin IgG: <7 — ABNORMAL LOW (ref <11)

## 2011-04-24 LAB — THC (MARIJUANA), URINE, CONFIRMATION: Marijuana, Ur-Confirmation: 370 ng/mL

## 2011-04-26 LAB — CBC
HCT: 31.1 — ABNORMAL LOW
Hemoglobin: 10.4 — ABNORMAL LOW
MCHC: 33.5
MCV: 80.6
Platelets: 268
RBC: 3.86 — ABNORMAL LOW
RDW: 15.1 — ABNORMAL HIGH
WBC: 8.2

## 2011-04-26 LAB — DIFFERENTIAL
Basophils Absolute: 0
Basophils Relative: 0
Eosinophils Absolute: 0.3
Eosinophils Relative: 4
Lymphocytes Relative: 17
Lymphs Abs: 1.4
Monocytes Absolute: 0.8 — ABNORMAL HIGH
Monocytes Relative: 9
Neutro Abs: 5.7
Neutrophils Relative %: 70

## 2011-04-26 LAB — URINE CULTURE
Colony Count: NO GROWTH
Culture: NO GROWTH

## 2011-04-26 LAB — GC/CHLAMYDIA PROBE AMP, GENITAL
Chlamydia, DNA Probe: NEGATIVE
GC Probe Amp, Genital: NEGATIVE

## 2011-04-26 LAB — WET PREP, GENITAL
Trich, Wet Prep: NONE SEEN
Yeast Wet Prep HPF POC: NONE SEEN

## 2011-04-26 LAB — POCT URINALYSIS DIP (DEVICE)
Glucose, UA: 100 — AB
Nitrite: NEGATIVE
Operator id: 235561
Protein, ur: 100 — AB
Specific Gravity, Urine: >=1.03
Urobilinogen, UA: 2 — ABNORMAL HIGH
pH: 7

## 2011-04-26 LAB — ABO/RH: ABO/RH(D): B POS

## 2011-04-26 LAB — RPR: RPR Ser Ql: NONREACTIVE

## 2011-04-26 LAB — HCG, QUANTITATIVE, PREGNANCY: hCG, Beta Chain, Quant, S: 73254 — ABNORMAL HIGH

## 2017-02-21 ENCOUNTER — Encounter: Payer: Self-pay | Admitting: Obstetrics & Gynecology

## 2017-02-21 ENCOUNTER — Encounter: Payer: Self-pay | Admitting: *Deleted

## 2017-02-21 ENCOUNTER — Other Ambulatory Visit (HOSPITAL_COMMUNITY)
Admission: RE | Admit: 2017-02-21 | Discharge: 2017-02-21 | Disposition: A | Discharge status: Home / Self Care | Payer: Medicaid Other | Source: Ambulatory Visit | Attending: Obstetrics & Gynecology | Admitting: Obstetrics & Gynecology

## 2017-02-21 ENCOUNTER — Ambulatory Visit (INDEPENDENT_AMBULATORY_CARE_PROVIDER_SITE_OTHER): Payer: Medicaid Other | Admitting: Obstetrics & Gynecology

## 2017-02-21 VITALS — BP 161/90 | HR 66 | Wt 155.2 lb

## 2017-02-21 DIAGNOSIS — Z3202 Encounter for pregnancy test, result negative: Secondary | ICD-10-CM

## 2017-02-21 DIAGNOSIS — Z01419 Encounter for gynecological examination (general) (routine) without abnormal findings: Secondary | ICD-10-CM | POA: Insufficient documentation

## 2017-02-21 DIAGNOSIS — Z3049 Encounter for surveillance of other contraceptives: Secondary | ICD-10-CM

## 2017-02-21 DIAGNOSIS — Z3046 Encounter for surveillance of implantable subdermal contraceptive: Secondary | ICD-10-CM

## 2017-02-21 DIAGNOSIS — Z0000 Encounter for general adult medical examination without abnormal findings: Secondary | ICD-10-CM | POA: Diagnosis not present

## 2017-02-21 DIAGNOSIS — Z308 Encounter for other contraceptive management: Secondary | ICD-10-CM

## 2017-02-21 LAB — POCT URINE PREGNANCY: PREG TEST UR: NEGATIVE

## 2017-02-21 MED ORDER — MEDROXYPROGESTERONE ACETATE 150 MG/ML IM SUSP: 150.0000 mg | INTRAMUSCULAR | 0 refills | Status: DC

## 2017-02-21 NOTE — Progress Notes (Signed)
Patient ID: [name], female   DOB: Oct 31, 1975, 41 y.o.   MRN: [id number]  Chief Complaint  Patient presents with  . Gynecologic Exam    HPI [name] is a 41 y.o. female.  [id number] No LMP recorded. She has had Implanon for 8 years and she requests removal and to start DMPA.  HPI  Past Medical History:  Diagnosis Date  . History of cervical dysplasia    20 yrs ago     Past Surgical History:  Procedure Laterality Date  . APPENDECTOMY    . CESAREAN SECTION      Family History  Problem Relation Age of Onset  . Hypertension Mother   . Breast cancer Mother   . Diabetes Father   . Hypertension Father   . Breast cancer Paternal Grandmother     Social History Social History  Substance Use Topics  . Smoking status: Current Some Day Smoker    Types: Cigarettes  . Smokeless tobacco: Never Used  . Alcohol use Yes     Comment: occ.    No Known Allergies  Current Outpatient Prescriptions  Medication Sig Dispense Refill  . medroxyPROGESTERone (DEPO-PROVERA) 150 MG/ML injection Inject 1 mL (150 mg total) into the muscle every 3 (three) months. 1 mL 0   No current facility-administered medications for this visit.     Review of Systems Review of Systems  Respiratory: Negative.   Gastrointestinal: Negative.   Genitourinary: Negative.   Musculoskeletal: Negative.     Blood pressure (!) 161/90, pulse 66, weight 155 lb 3.2 oz (70.4 kg).  Physical Exam Physical Exam  Constitutional: She is oriented to person, place, and time. She appears well-developed. No distress.  Cardiovascular: Normal rate.   Pulmonary/Chest: Effort normal. No respiratory distress.  Breasts: breasts appear normal, no suspicious masses, no skin or nipple changes or axillary nodes.   Abdominal: Soft. She exhibits no distension. There is no tenderness.  Genitourinary: Vagina normal and uterus normal. No vaginal discharge found.  Genitourinary Comments: Pap done, no mass   Neurological: She is alert and oriented to person, place, and time.  Psychiatric: She has a normal mood and affect. Her behavior is normal.  Vitals reviewed.  GYNECOLOGY OFFICE PROCEDURE NOTE    Nexplanon Removal Patient identified, informed consent performed, consent signed.   Appropriate time out taken. Nexplanon site identified.  Area prepped in usual sterile fashon. One ml of 1% lidocaine was used to anesthetize the area at the distal end of the implant. A small stab incision was made right beside the implant on the distal portion.  The Nexplanon rod was grasped using hemostats and removed without difficulty.  There was minimal blood loss. There were no complications.  3 ml of 1% lidocaine was injected around the incision for post-procedure analgesia.  Steri-strips were applied over the small incision.  A pressure bandage was applied to reduce any bruising.  The patient tolerated the procedure well and was given post procedure instructions.  Patient is planning to use DMPA for contraception/attempt conception.       Woodroe Mode, MD Attending Obstetrician & Gynecologist, [facility]   Assessment    Well woman exam Implanon expired, removed Start DMPA today     Plan    DMPA im every 3 months F/U pap result Yearly exam and mammogram ordered       Emeterio Reeve 02/21/2017, 11:46 AM

## 2017-02-21 NOTE — Progress Notes (Signed)
Pt presents for annual, pap, and expired removal of Nexplanon. Pt wants Depo. She declines STD testing today.

## 2017-02-21 NOTE — Patient Instructions (Signed)
Mammogram A mammogram is an X-ray of the breasts that is done to check for changes that are not normal. This test can screen for and find any changes that may suggest breast cancer. This test can also help to find other changes and variations in the breast. What happens before the procedure?  Have this test done about 1-2 weeks after your period. This is usually when your breasts are the least tender.  If you are visiting a new doctor or clinic, send any past mammogram images to your new doctor's office.  Wash your breasts and under your arms the day of the test.  Do not use deodorants, perfumes, lotions, or powders on the day of the test.  Take off any jewelry from your neck.  Wear clothes that you can change into and out of easily. What happens during the procedure?  You will undress from the waist up. You will put on a gown.  You will stand in front of the X-ray machine.  Each breast will be placed between two plastic or glass plates. The plates will press down on your breast for a few seconds. Try to stay as relaxed as possible. This does not cause any harm to your breasts. Any discomfort you feel will be very brief.  X-rays will be taken from different angles of each breast. The procedure may vary among doctors and hospitals. What happens after the procedure?  The mammogram will be looked at by a specialist (radiologist).  You may need to do certain parts of the test again. This depends on the quality of the images.  Ask when your test results will be ready. Make sure you get your test results.  You may go back to your normal activities. This information is not intended to replace advice given to you by your health care provider. Make sure you discuss any questions you have with your health care provider. Document Released: 09/22/2008 Document Revised: 12/02/2015 Document Reviewed: 09/04/2014 Elsevier Interactive Patient Education  2018 [employer]vier Inc.  

## 2017-02-22 ENCOUNTER — Ambulatory Visit (INDEPENDENT_AMBULATORY_CARE_PROVIDER_SITE_OTHER): Payer: Medicaid Other

## 2017-02-22 DIAGNOSIS — Z3042 Encounter for surveillance of injectable contraceptive: Secondary | ICD-10-CM

## 2017-02-22 MED ORDER — MEDROXYPROGESTERONE ACETATE 150 MG/ML IM SUSP
150.0000 mg | Freq: Once | INTRAMUSCULAR | Status: AC
  Administered 2017-02-22: 150 mg via INTRAMUSCULAR

## 2017-02-22 NOTE — Progress Notes (Signed)
Nurse visit for pt supply depo. Pt tolerated inj. well. Next Depo due Nov 7 pt agrees.

## 2017-03-02 LAB — CYTOLOGY - PAP
Diagnosis: NEGATIVE
HPV (WINDOPATH): NOT DETECTED

## 2017-03-08 ENCOUNTER — Other Ambulatory Visit: Payer: Self-pay | Admitting: Obstetrics & Gynecology

## 2017-03-08 DIAGNOSIS — Z1231 Encounter for screening mammogram for malignant neoplasm of breast: Secondary | ICD-10-CM

## 2017-03-22 ENCOUNTER — Ambulatory Visit: Payer: Self-pay

## 2017-04-06 ENCOUNTER — Ambulatory Visit: Payer: Self-pay

## 2017-05-18 ENCOUNTER — Other Ambulatory Visit: Payer: Self-pay | Admitting: Obstetrics & Gynecology

## 2017-05-18 ENCOUNTER — Other Ambulatory Visit: Payer: Self-pay

## 2017-05-18 ENCOUNTER — Ambulatory Visit (INDEPENDENT_AMBULATORY_CARE_PROVIDER_SITE_OTHER): Payer: Medicaid Other

## 2017-05-18 DIAGNOSIS — Z3042 Encounter for surveillance of injectable contraceptive: Secondary | ICD-10-CM

## 2017-05-18 DIAGNOSIS — Z308 Encounter for other contraceptive management: Secondary | ICD-10-CM

## 2017-05-18 MED ORDER — MEDROXYPROGESTERONE ACETATE 150 MG/ML IM SUSP: 150.0000 mg | INTRAMUSCULAR | 0 refills | Status: DC

## 2017-05-18 MED ORDER — MEDROXYPROGESTERONE ACETATE 150 MG/ML IM SUSP
150.0000 mg | Freq: Once | INTRAMUSCULAR | Status: AC
  Administered 2017-05-18: 150 mg via INTRAMUSCULAR

## 2017-05-18 MED ORDER — MEDROXYPROGESTERONE ACETATE 150 MG/ML IM SUSP: 150.0000 mg | INTRAMUSCULAR | 4 refills | Status: DC

## 2017-05-18 NOTE — Progress Notes (Signed)
Presents for DEPO, given in right deltoid, tolerated well. Will see a PCP to manage BP.  Next DEPO 1/25-08/16/2016  Administrations This Visit    medroxyPROGESTERone (DEPO-PROVERA) injection 150 mg    Admin Date 05/18/2017 Action Given Dose 150 mg Route Intramuscular Administered By Tamela Oddi, RMA

## 2017-05-29 ENCOUNTER — Ambulatory Visit: Payer: Self-pay | Admitting: Family Medicine

## 2017-06-01 ENCOUNTER — Ambulatory Visit: Payer: Self-pay | Admitting: Family Medicine

## 2017-08-07 ENCOUNTER — Ambulatory Visit: Payer: Medicaid Other

## 2017-08-08 ENCOUNTER — Telehealth: Payer: Self-pay | Admitting: *Deleted

## 2017-08-08 ENCOUNTER — Encounter: Payer: Self-pay | Admitting: *Deleted

## 2017-08-08 NOTE — Telephone Encounter (Signed)
Attempted to call patient to reschedule Depo appt.or let us know her plans,sent letter

## 2017-08-17 ENCOUNTER — Ambulatory Visit (INDEPENDENT_AMBULATORY_CARE_PROVIDER_SITE_OTHER): Payer: Medicaid Other

## 2017-08-17 VITALS — BP 167/78 | HR 60

## 2017-08-17 DIAGNOSIS — Z3042 Encounter for surveillance of injectable contraceptive: Secondary | ICD-10-CM

## 2017-08-17 DIAGNOSIS — Z308 Encounter for other contraceptive management: Secondary | ICD-10-CM

## 2017-08-17 MED ORDER — MEDROXYPROGESTERONE ACETATE 150 MG/ML IM SUSP
150.0000 mg | Freq: Once | INTRAMUSCULAR | Status: AC
  Administered 2017-08-17: 150 mg via INTRAMUSCULAR

## 2017-08-17 MED ORDER — MEDROXYPROGESTERONE ACETATE 150 MG/ML IM SUSP: 150.0000 mg | INTRAMUSCULAR | 4 refills | Status: DC

## 2017-08-17 NOTE — Progress Notes (Signed)
Pt here for a depo shot. Inj given in right deltoid. Pt tolerated well. Next depo due 4/26-5/10.

## 2017-10-21 ENCOUNTER — Other Ambulatory Visit: Payer: Self-pay

## 2017-10-21 ENCOUNTER — Encounter (HOSPITAL_BASED_OUTPATIENT_CLINIC_OR_DEPARTMENT_OTHER): Payer: Self-pay | Admitting: *Deleted

## 2017-10-21 ENCOUNTER — Emergency Department (HOSPITAL_BASED_OUTPATIENT_CLINIC_OR_DEPARTMENT_OTHER): Payer: Medicaid Other

## 2017-10-21 ENCOUNTER — Emergency Department (HOSPITAL_BASED_OUTPATIENT_CLINIC_OR_DEPARTMENT_OTHER)
Admission: EM | Admit: 2017-10-21 | Discharge: 2017-10-21 | Disposition: A | Discharge status: Home / Self Care | Payer: Medicaid Other | Attending: Emergency Medicine | Admitting: Emergency Medicine

## 2017-10-21 DIAGNOSIS — R319 Hematuria, unspecified: Secondary | ICD-10-CM | POA: Insufficient documentation

## 2017-10-21 DIAGNOSIS — F17210 Nicotine dependence, cigarettes, uncomplicated: Secondary | ICD-10-CM | POA: Diagnosis not present

## 2017-10-21 DIAGNOSIS — M25562 Pain in left knee: Secondary | ICD-10-CM | POA: Diagnosis not present

## 2017-10-21 DIAGNOSIS — S62515A Nondisplaced fracture of proximal phalanx of left thumb, initial encounter for closed fracture: Secondary | ICD-10-CM | POA: Diagnosis not present

## 2017-10-21 DIAGNOSIS — R52 Pain, unspecified: Secondary | ICD-10-CM

## 2017-10-21 DIAGNOSIS — T148XXA Other injury of unspecified body region, initial encounter: Secondary | ICD-10-CM

## 2017-10-21 DIAGNOSIS — S3992XA Unspecified injury of lower back, initial encounter: Secondary | ICD-10-CM | POA: Diagnosis present

## 2017-10-21 DIAGNOSIS — R0789 Other chest pain: Secondary | ICD-10-CM | POA: Insufficient documentation

## 2017-10-21 DIAGNOSIS — S300XXA Contusion of lower back and pelvis, initial encounter: Secondary | ICD-10-CM | POA: Insufficient documentation

## 2017-10-21 DIAGNOSIS — Y929 Unspecified place or not applicable: Secondary | ICD-10-CM | POA: Diagnosis not present

## 2017-10-21 DIAGNOSIS — R109 Unspecified abdominal pain: Secondary | ICD-10-CM | POA: Insufficient documentation

## 2017-10-21 DIAGNOSIS — W19XXXA Unspecified fall, initial encounter: Secondary | ICD-10-CM

## 2017-10-21 DIAGNOSIS — W108XXA Fall (on) (from) other stairs and steps, initial encounter: Secondary | ICD-10-CM | POA: Diagnosis not present

## 2017-10-21 DIAGNOSIS — Y9389 Activity, other specified: Secondary | ICD-10-CM | POA: Diagnosis not present

## 2017-10-21 DIAGNOSIS — Y999 Unspecified external cause status: Secondary | ICD-10-CM | POA: Insufficient documentation

## 2017-10-21 LAB — BASIC METABOLIC PANEL
ANION GAP: 9 (ref 5–15)
BUN: 10 mg/dL (ref 6–20)
CO2: 23 mmol/L (ref 22–32)
Calcium: 9 mg/dL (ref 8.9–10.3)
Chloride: 107 mmol/L (ref 101–111)
Creatinine, Ser: 0.73 mg/dL (ref 0.44–1.00)
GFR calc Af Amer: >60 mL/min (ref >60)
GLUCOSE: 89 mg/dL (ref 65–99)
POTASSIUM: 3.5 mmol/L (ref 3.5–5.1)
Sodium: 139 mmol/L (ref 135–145)

## 2017-10-21 LAB — CBC WITH DIFFERENTIAL/PLATELET
BASOS ABS: 0 10*3/uL (ref 0.0–0.1)
Basophils Relative: 0 %
Eosinophils Absolute: 0.2 10*3/uL (ref 0.0–0.7)
Eosinophils Relative: 4 %
HEMATOCRIT: 35.8 % — AB (ref 36.0–46.0)
HEMOGLOBIN: 12.1 g/dL (ref 12.0–15.0)
LYMPHS PCT: 50 %
Lymphs Abs: 2.6 10*3/uL (ref 0.7–4.0)
MCH: 30.6 pg (ref 26.0–34.0)
MCHC: 33.8 g/dL (ref 30.0–36.0)
MCV: 90.4 fL (ref 78.0–100.0)
MONO ABS: 0.4 10*3/uL (ref 0.1–1.0)
Monocytes Relative: 8 %
NEUTROS ABS: 2 10*3/uL (ref 1.7–7.7)
NEUTROS PCT: 38 %
Platelets: 230 10*3/uL (ref 150–400)
RBC: 3.96 MIL/uL (ref 3.87–5.11)
RDW: 13.4 % (ref 11.5–15.5)
WBC: 5.3 10*3/uL (ref 4.0–10.5)

## 2017-10-21 LAB — PREGNANCY, URINE: Preg Test, Ur: NEGATIVE

## 2017-10-21 IMAGING — CR DG HAND COMPLETE 3+V*L*
3 series · 3 of 3 positions shown · non-contrast
Comparison: None.

CLINICAL DATA: Pain following fall

EXAM:
LEFT HAND - COMPLETE 3+ VIEW

[x hand pa left]
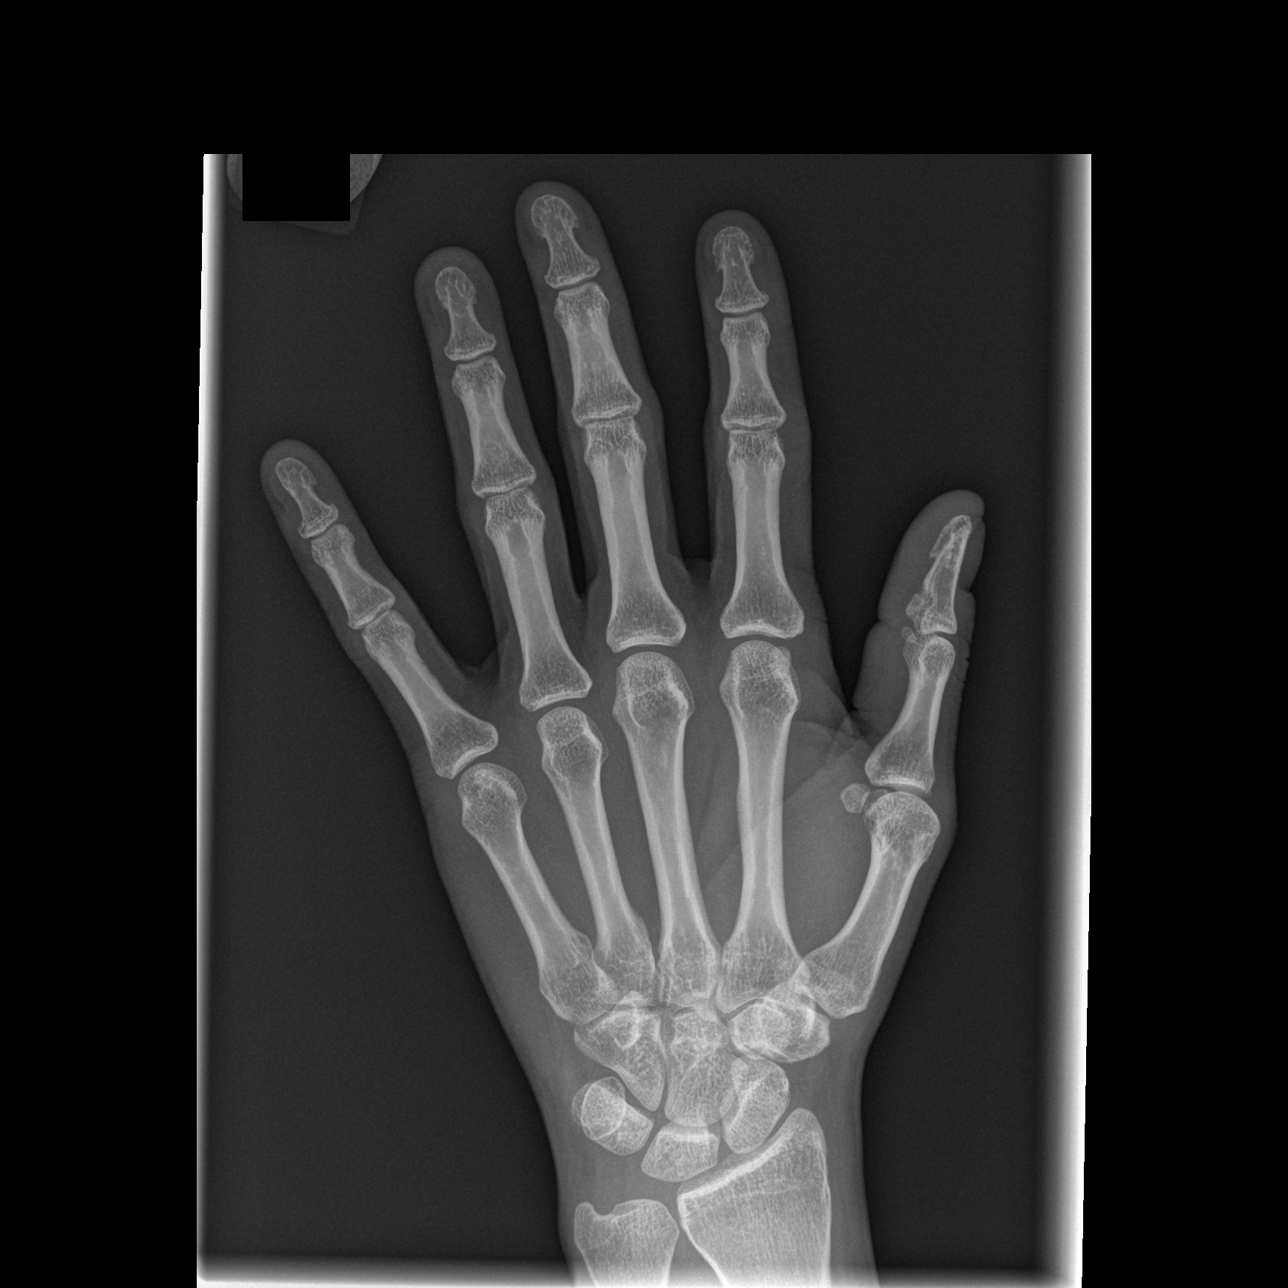

[x hand oblique left]
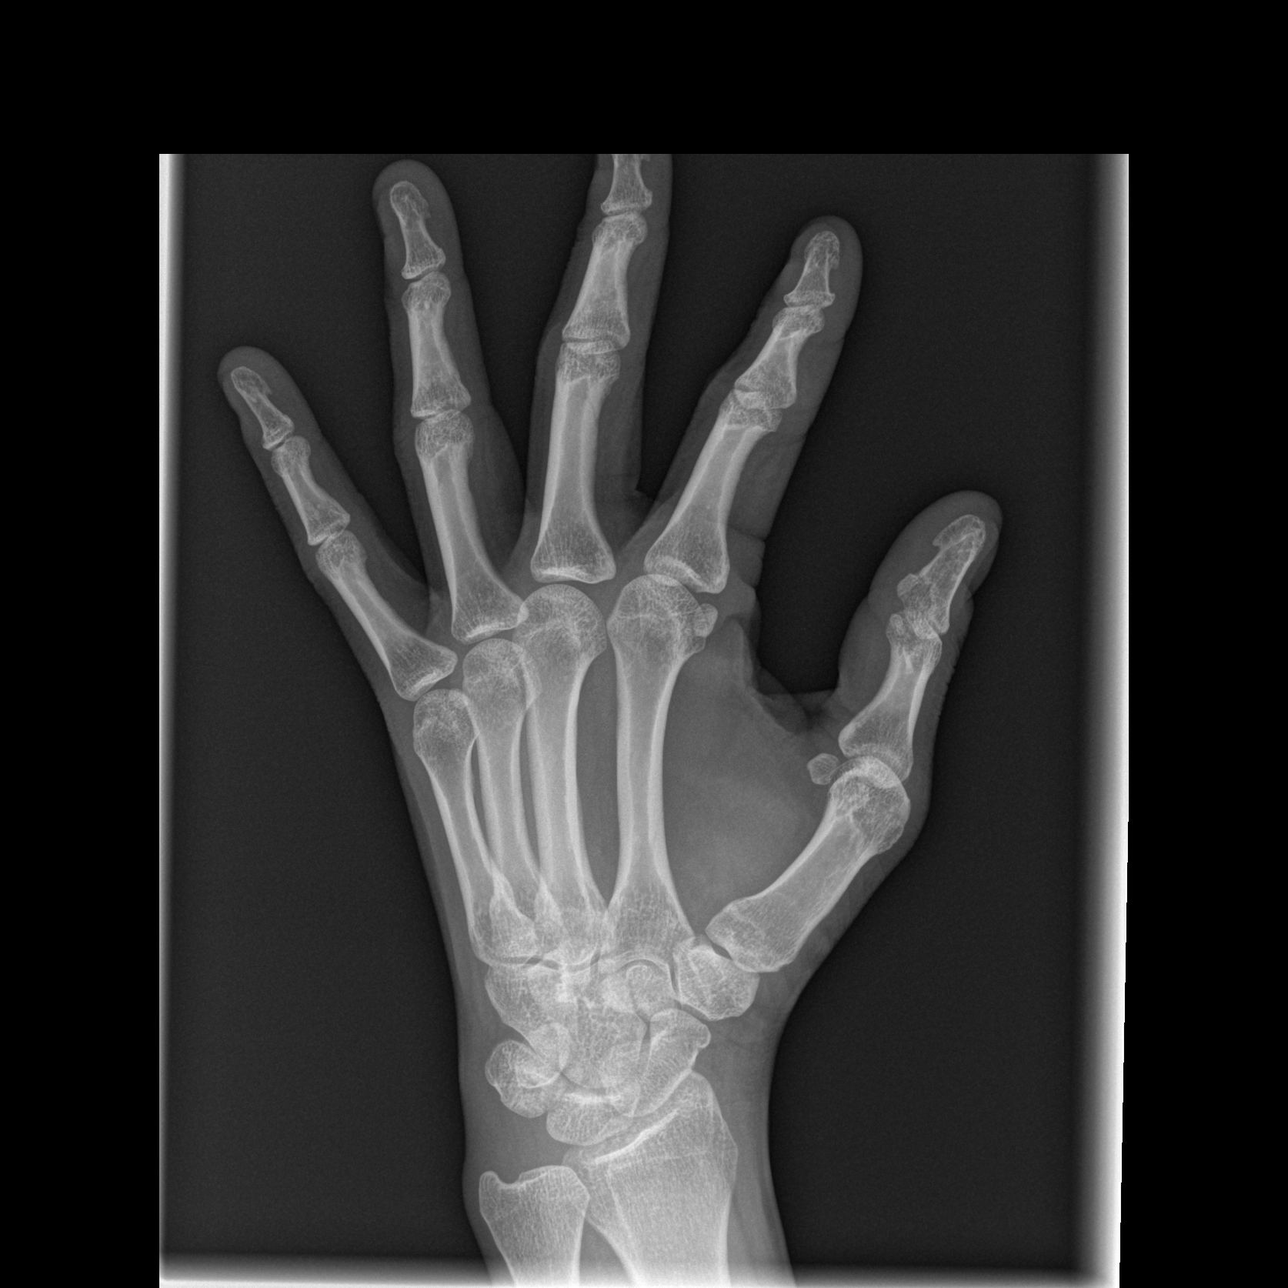

[x hand lat left]
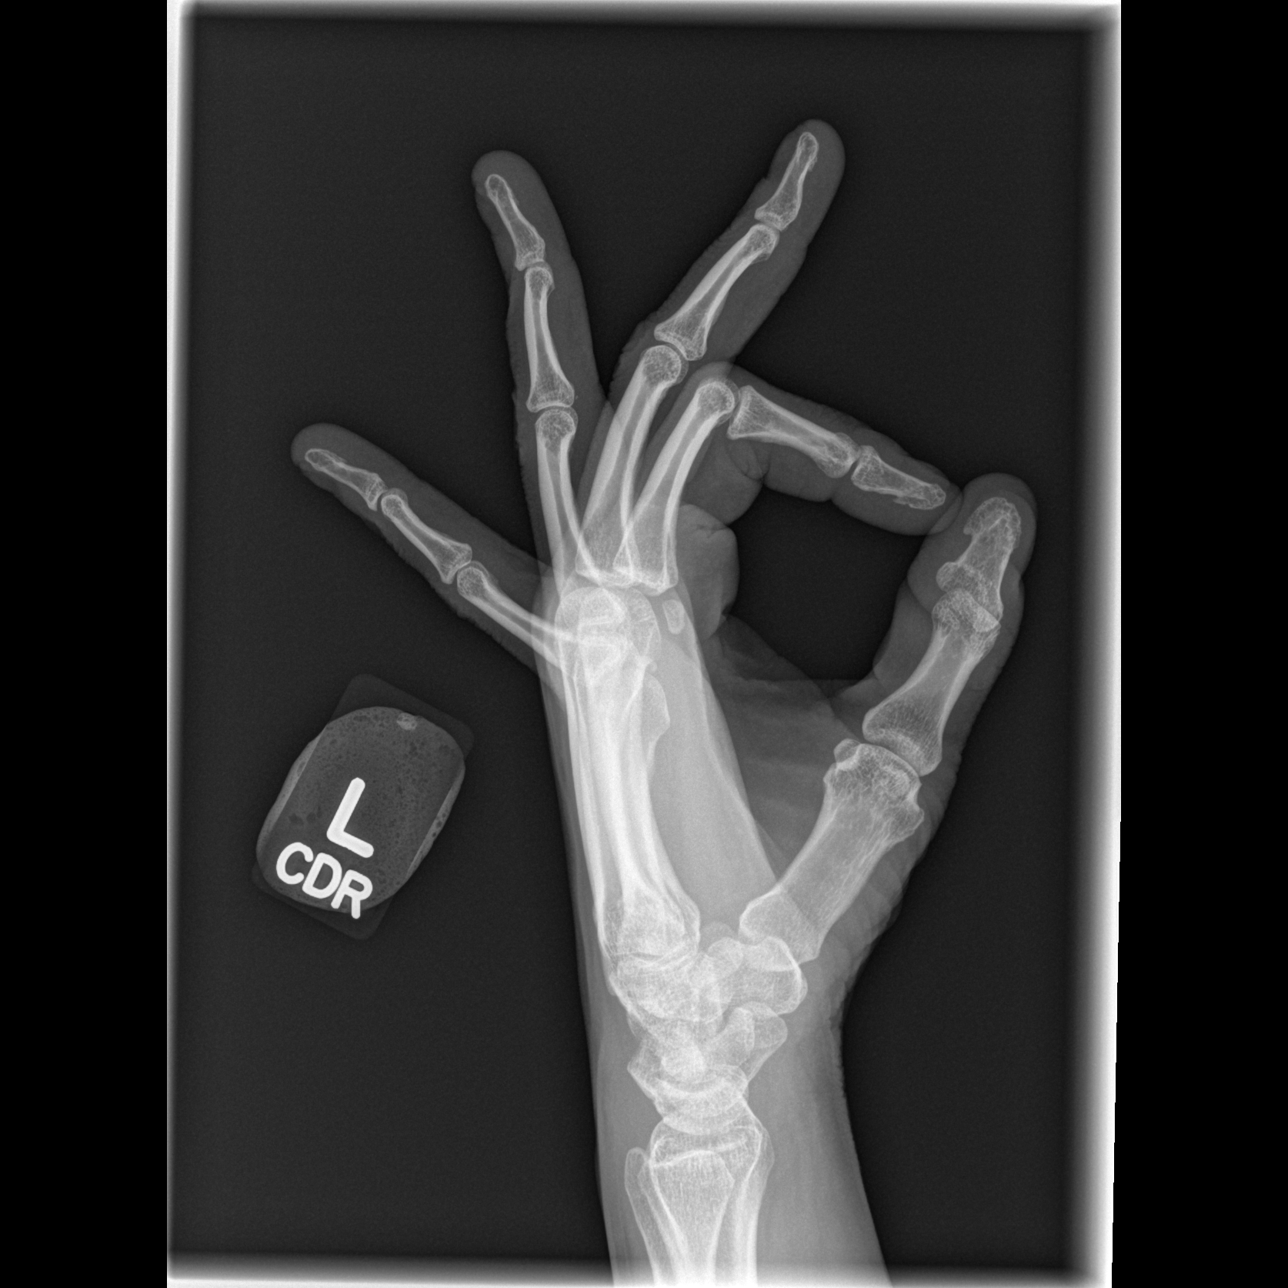

[3 of 3 positions shown; findings below may reference images not displayed]

FINDINGS: Frontal, oblique, and lateral views were obtained. There is a
fracture along the lateral aspect of the proximal portion of the
first distal phalanx with alignment near anatomic. No other
fracture. No dislocation. Joint spaces appear unremarkable. No
erosive change.
IMPRESSION: Fracture along the lateral proximal aspect of the first distal
phalanx with alignment near anatomic. No other fracture. No
dislocation. No evident arthropathic change.

## 2017-10-21 IMAGING — CR DG CHEST 2V
2 series · 2 of 2 positions shown · non-contrast
Comparison: None.

CLINICAL DATA: Pain following fall

EXAM:
CHEST - 2 VIEW

[w chest pa]
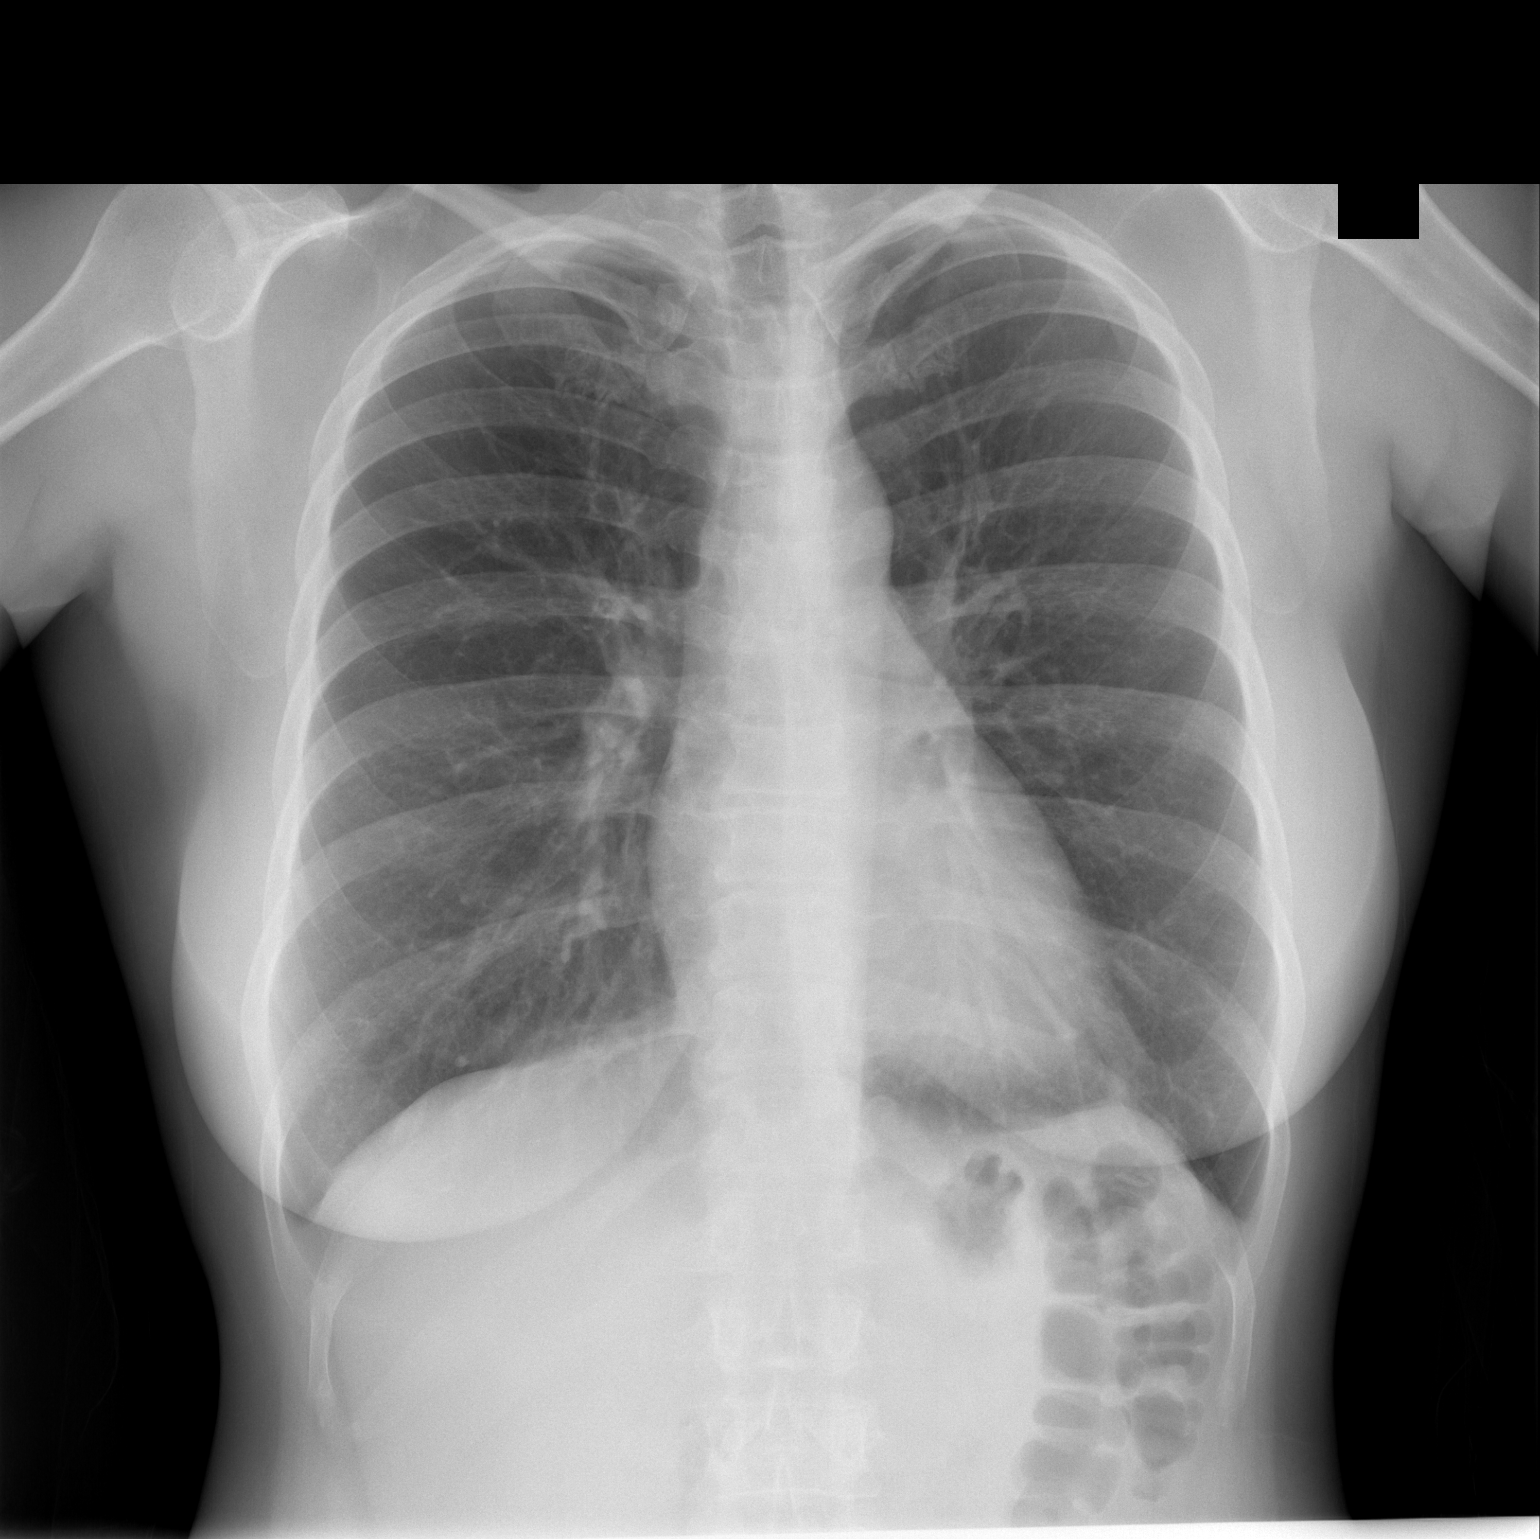

[w chest lat]
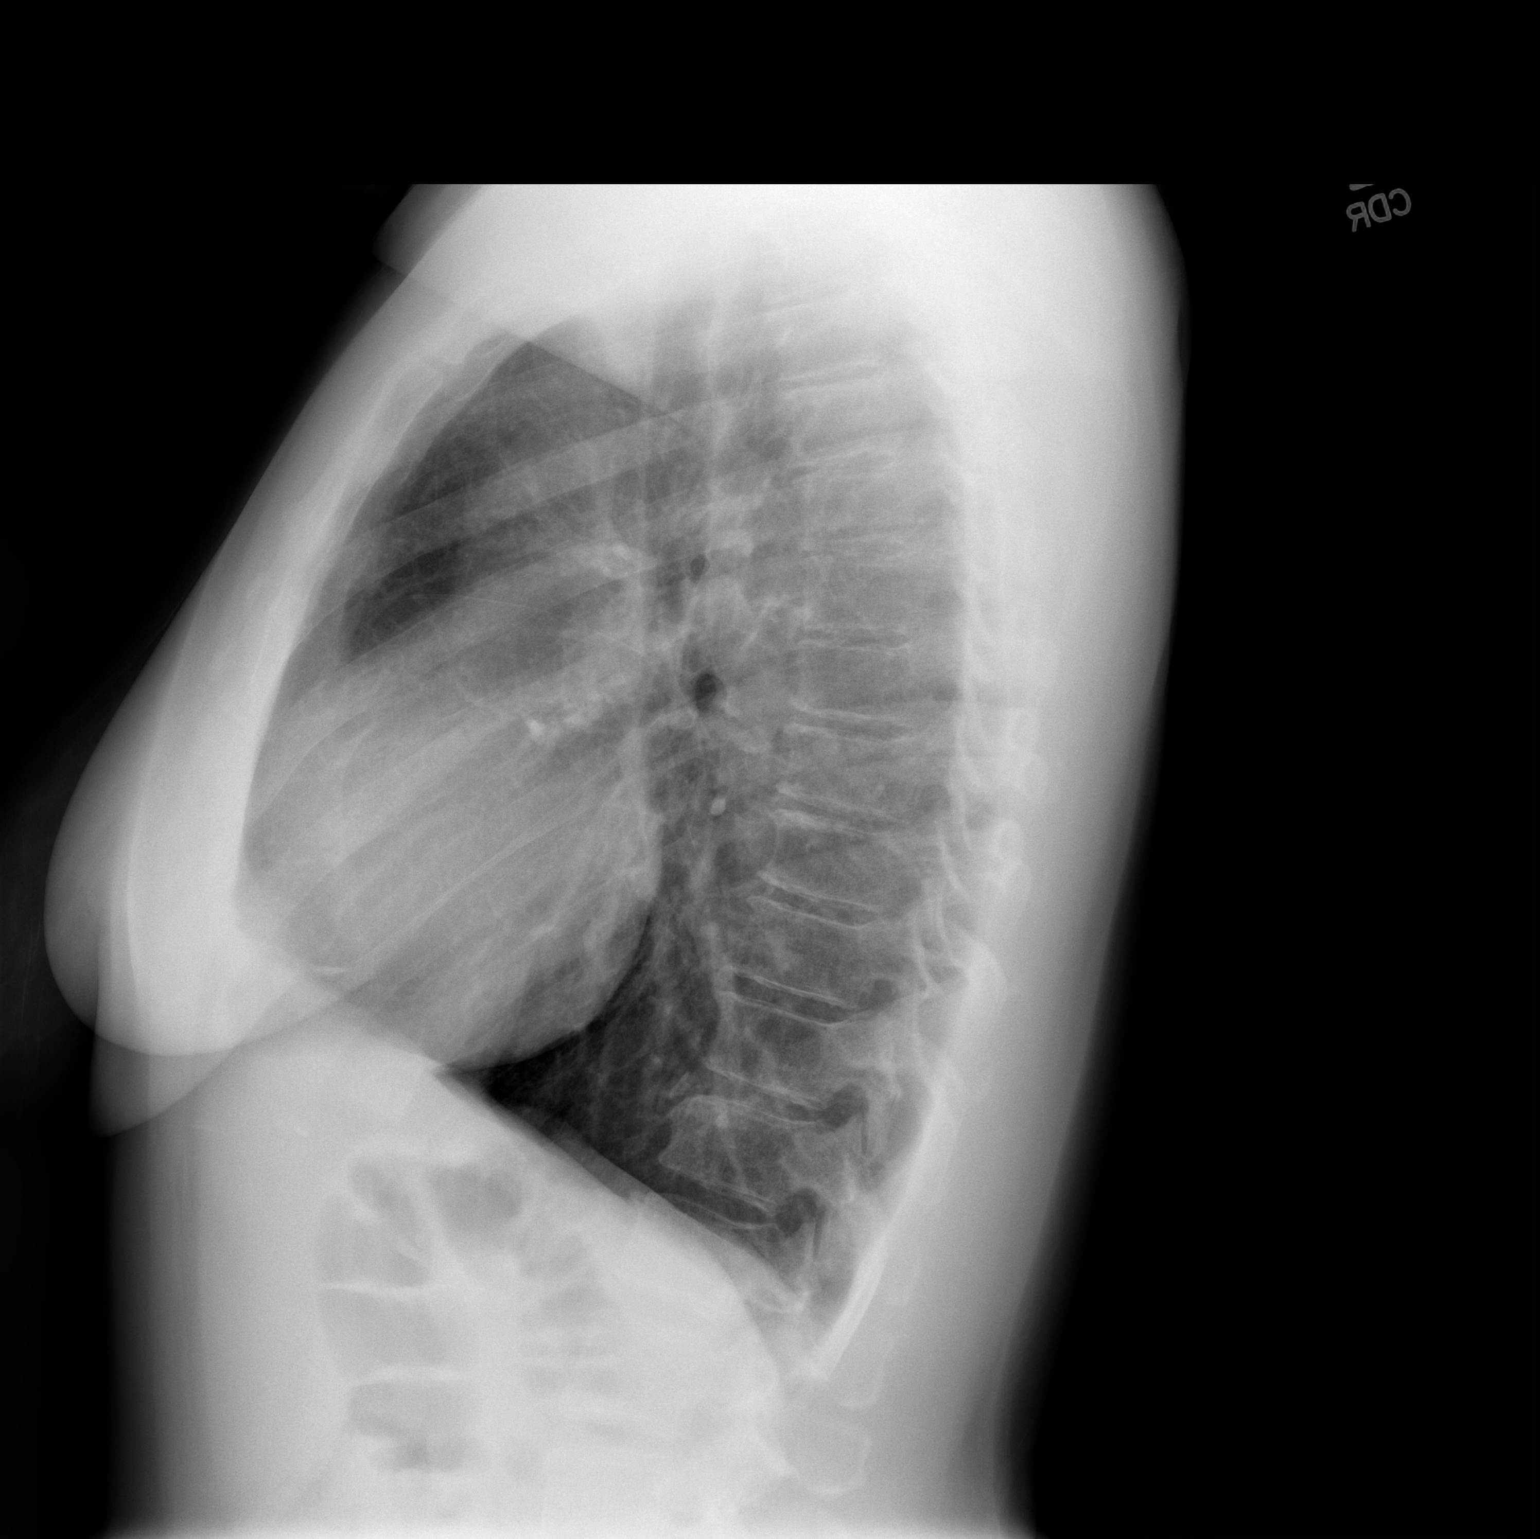

[2 of 2 positions shown; findings below may reference images not displayed]

FINDINGS: Lungs are clear. Heart size and pulmonary vascularity are normal. No
pneumothorax. No adenopathy. No bone lesions.
IMPRESSION: No edema or consolidation.

## 2017-10-21 IMAGING — CT CT ABD-PELV W/ CM
2 of 4 series · 15 of 46 positions shown, 17 images · IV contrast (APPLIED)
Comparison: [DATE]

CLINICAL DATA: Fell down steps tailbone back and rib pain vaginal
spotting

EXAM:
CT ABDOMEN AND PELVIS WITH CONTRAST
TECHNIQUE: Multidetector CT imaging of the abdomen and pelvis was performed
using the standard protocol following bolus administration of
intravenous contrast.
CONTRAST:  100mL [M6] IOPAMIDOL ([M6]) INJECTION 61%

[Series 5: l spine st thins · axial · 0.36mm/px · z∈[+958,+1204]mm · 12 of 274 slices shown, 14 images]
[im 14/274  soft-tissue]
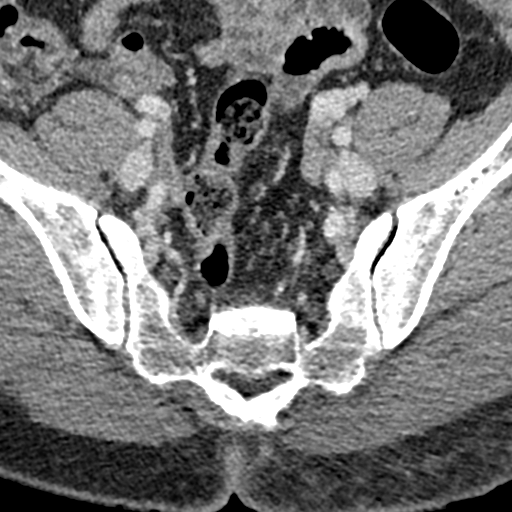
[im 14/274  bone]
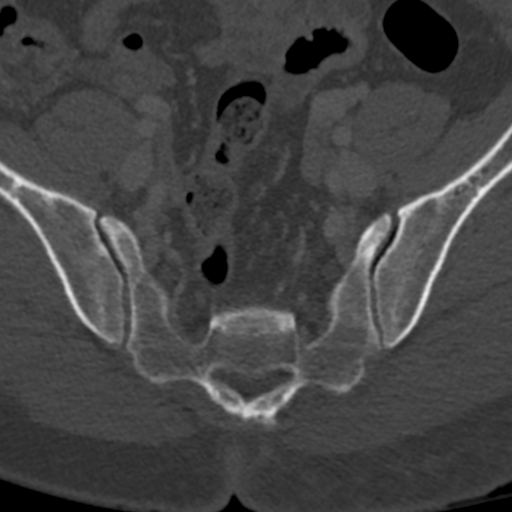
[im 41/274  soft-tissue]
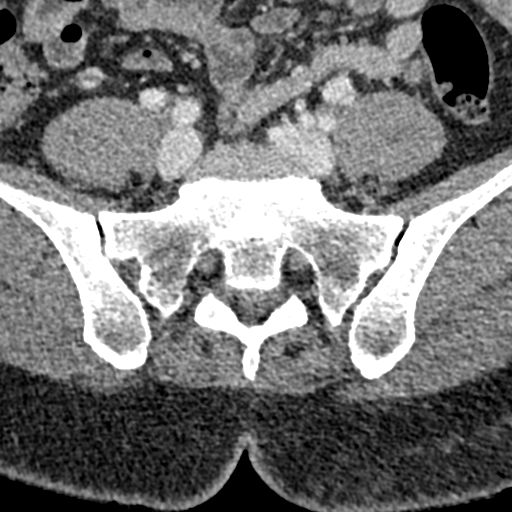
[im 55/274  soft-tissue]
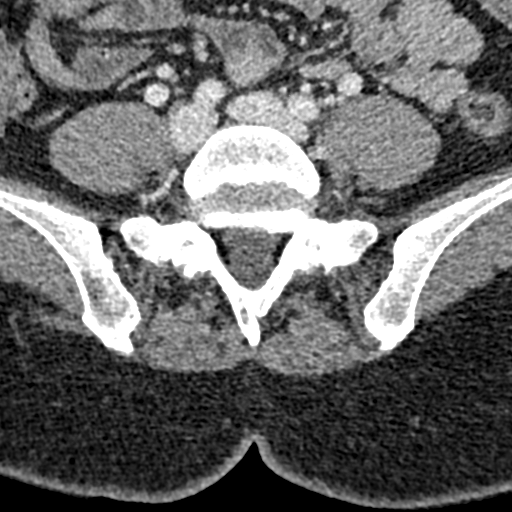
[im 82/274  soft-tissue]
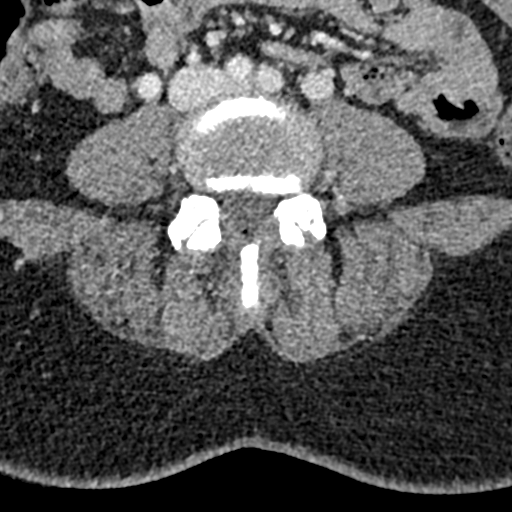
[im 110/274  soft-tissue]
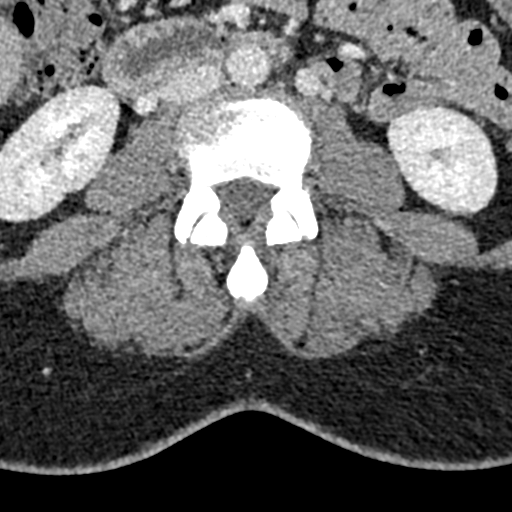
[im 123/274  soft-tissue]
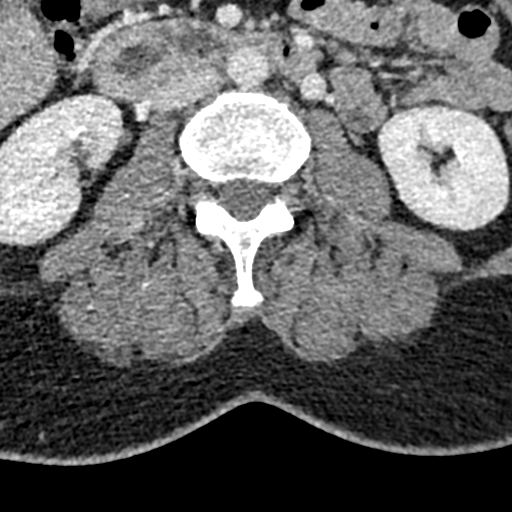
[im 151/274  soft-tissue]
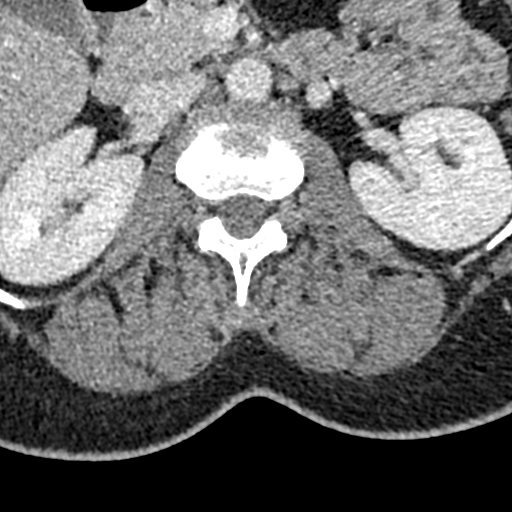
[im 164/274  soft-tissue]
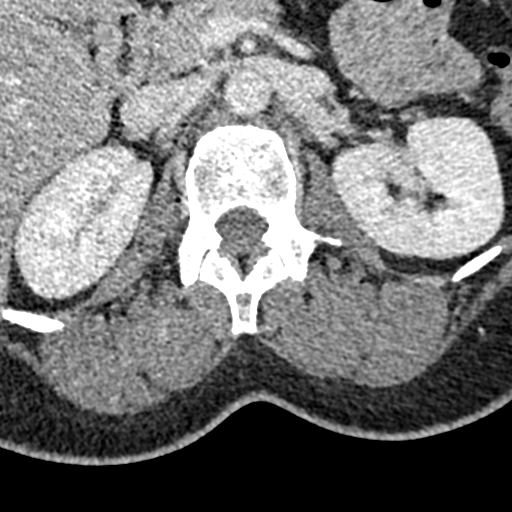
[im 192/274  soft-tissue]
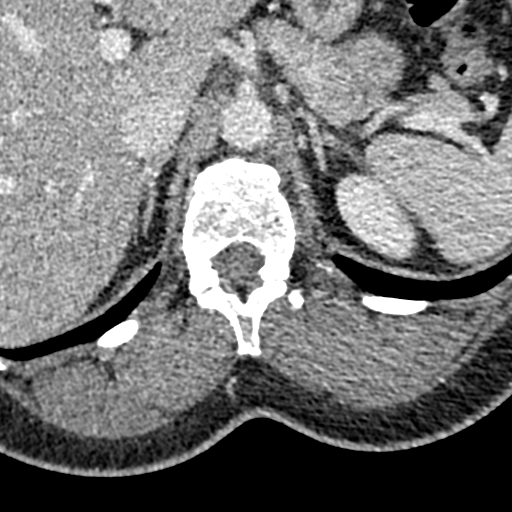
[im 192/274  bone]
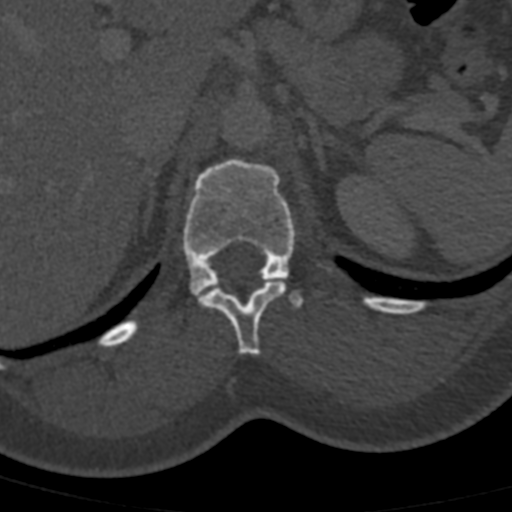
[im 219/274  soft-tissue]
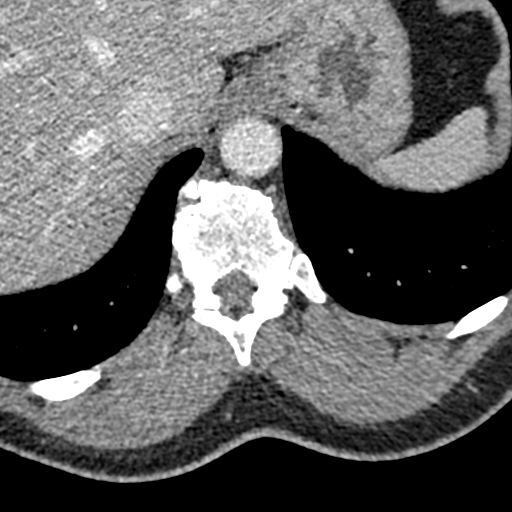
[im 233/274  soft-tissue]
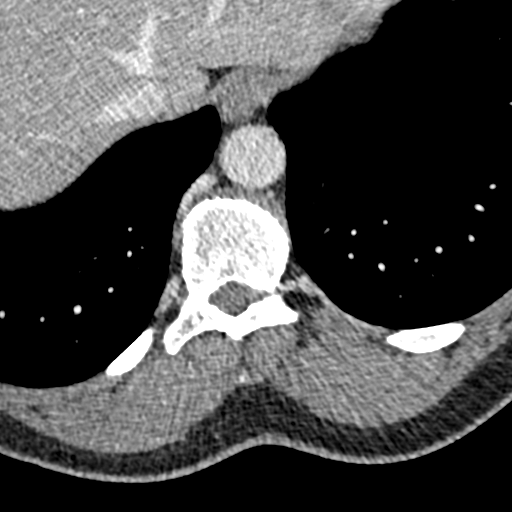
[im 260/274  soft-tissue]
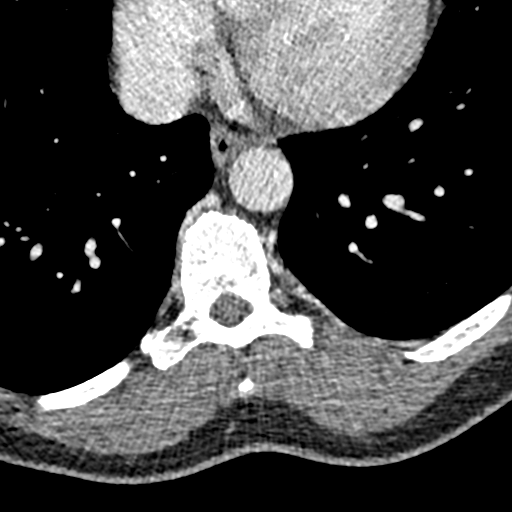

[Series 6: coronal spine · coronal · 0.36mm/px · 3 of 85 slices shown]
[im 29/85  soft-tissue]
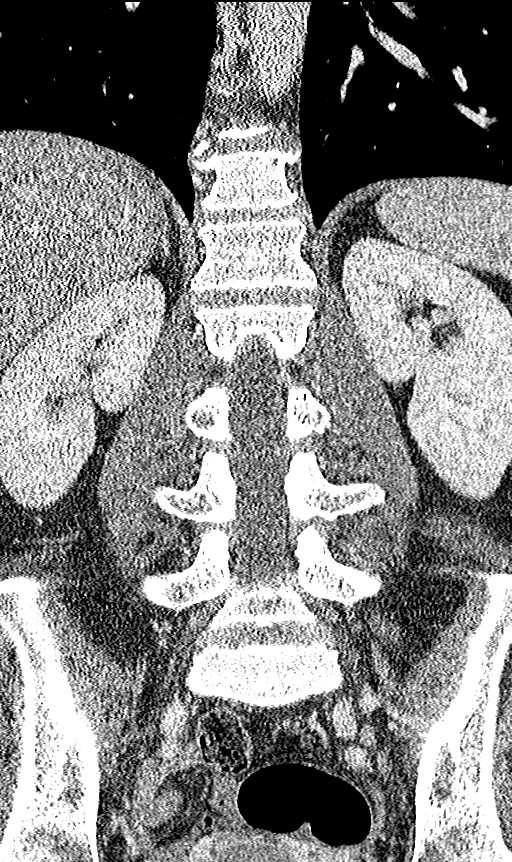
[im 38/85  soft-tissue]
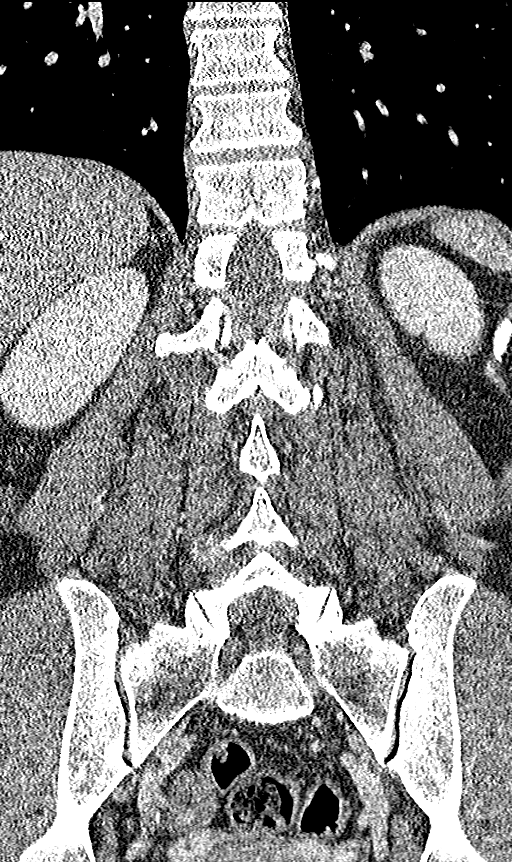
[im 47/85  soft-tissue]
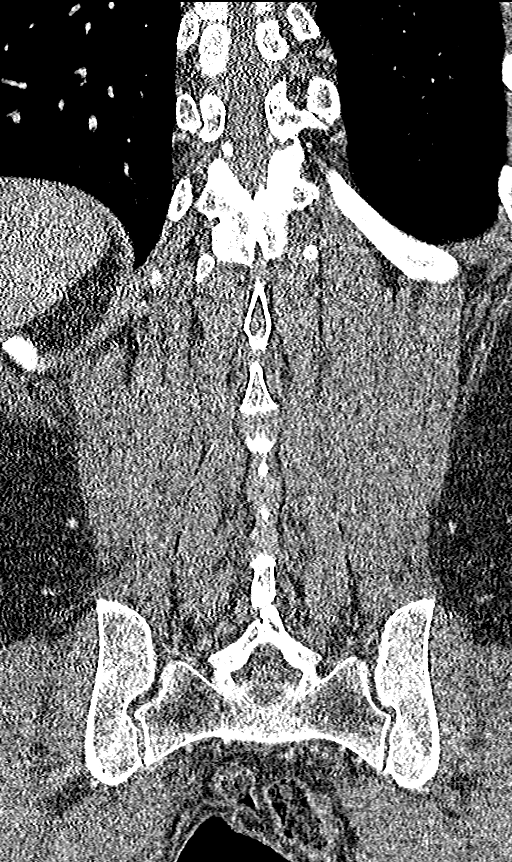

[15 of 46 positions shown; findings below may reference images not displayed]

FINDINGS: Lower chest: No acute abnormality.

Hepatobiliary: No focal liver abnormality is seen. No gallstones,
gallbladder wall thickening, or biliary dilatation.

Pancreas: Unremarkable. No pancreatic ductal dilatation or
surrounding inflammatory changes.

Spleen: Normal in size without focal abnormality.

Adrenals/Urinary Tract: Adrenal glands are unremarkable. Kidneys are
normal, without renal calculi, focal lesion, or hydronephrosis.
Bladder is unremarkable.

Stomach/Bowel: Stomach is within normal limits. Status post
appendectomy. No evidence of bowel wall thickening, distention, or
inflammatory changes.

Vascular/Lymphatic: No significant vascular findings are present. No
enlarged abdominal or pelvic lymph nodes.

Reproductive: Uterus and bilateral adnexa are unremarkable.
Prominent left greater than right parauterine vessels.

Other: Negative for free air or free fluid.

Musculoskeletal: No acute or significant osseous findings.
IMPRESSION: 1. No CT evidence for acute intra-abdominal or pelvic abnormality.
2. Prominent left greater than right parauterine vasculature,
questionable for pelvic congestion.

## 2017-10-21 IMAGING — CT CT ABD-PELV W/ CM
2 of 5 series · 16 of 46 positions shown, 18 images · IV contrast (APPLIED)
Comparison: [DATE]

CLINICAL DATA: Fell down steps tailbone back and rib pain vaginal
spotting

EXAM:
CT ABDOMEN AND PELVIS WITH CONTRAST
TECHNIQUE: Multidetector CT imaging of the abdomen and pelvis was performed
using the standard protocol following bolus administration of
intravenous contrast.
CONTRAST:  100mL [M6] IOPAMIDOL ([M6]) INJECTION 61%

[Series 2: axial st · axial · 0.82mm/px · z∈[+824,+1198]mm · 13 of 85 slices shown, 15 images]
[im 5/85  soft-tissue]
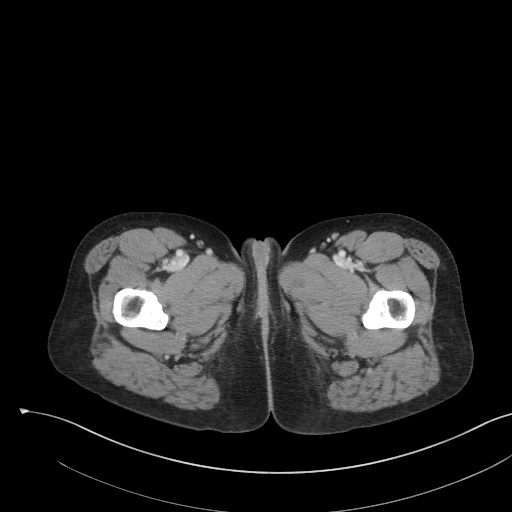
[im 5/85  bone]
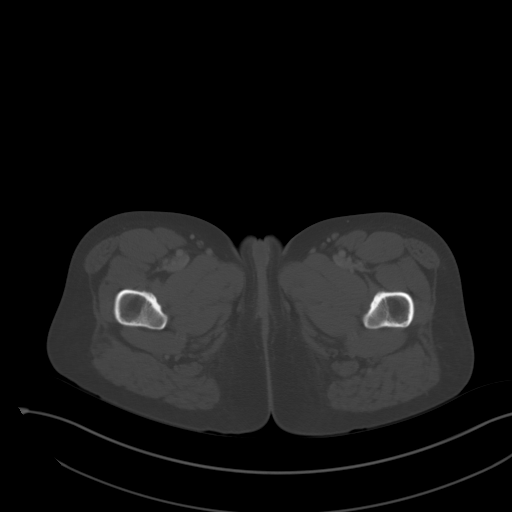
[im 10/85  soft-tissue]
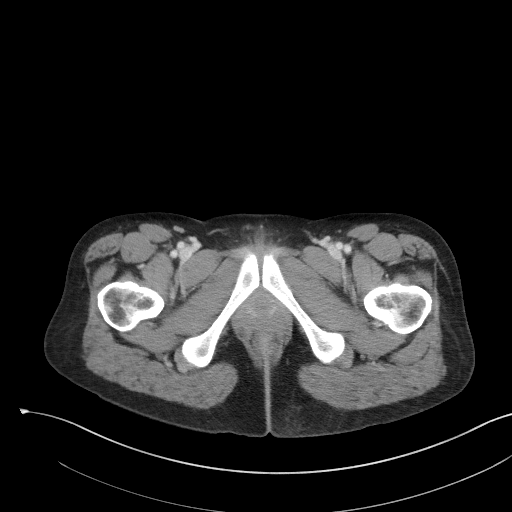
[im 20/85  soft-tissue]
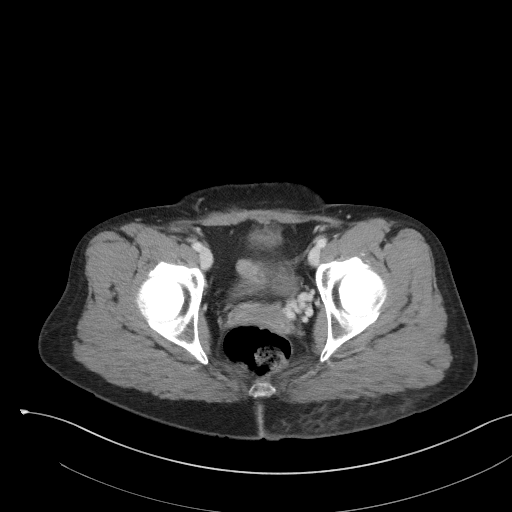
[im 25/85  soft-tissue]
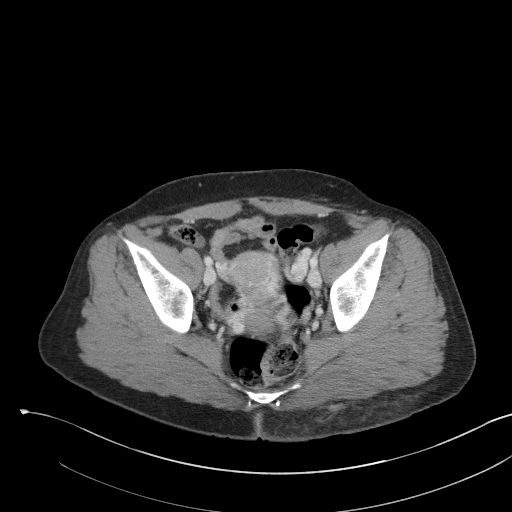
[im 30/85  soft-tissue]
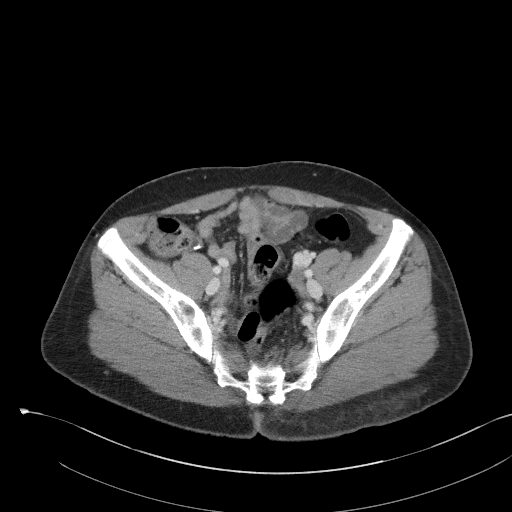
[im 35/85  soft-tissue]
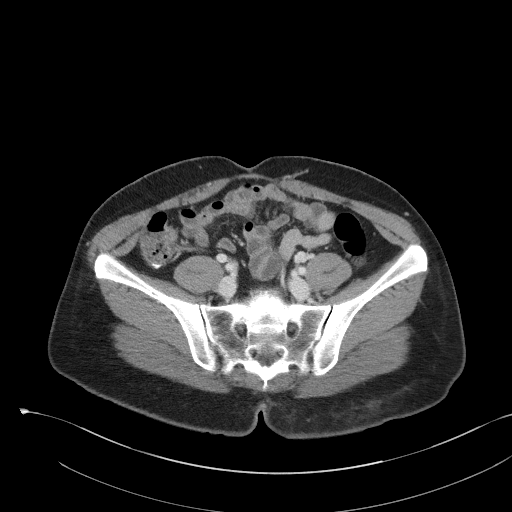
[im 45/85  soft-tissue]
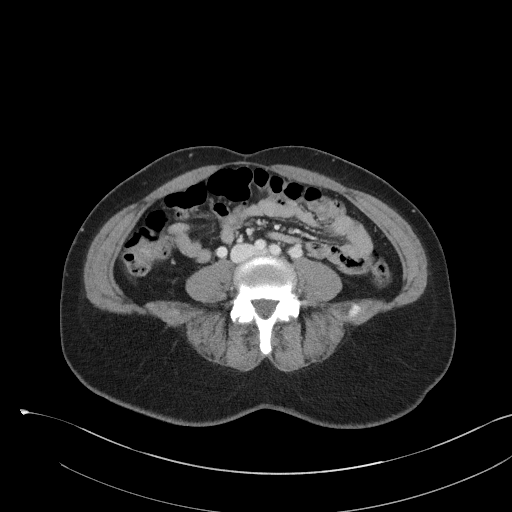
[im 50/85  soft-tissue]
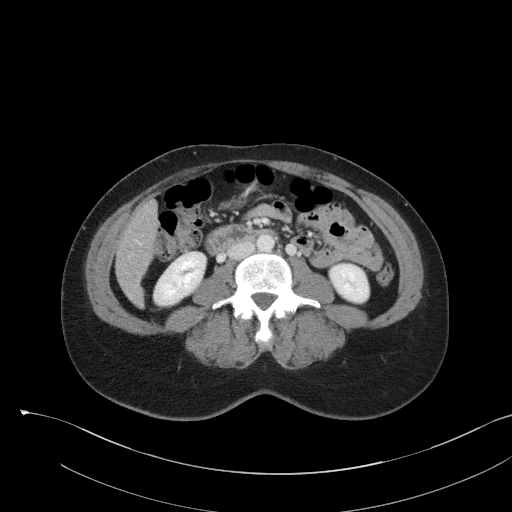
[im 55/85  soft-tissue]
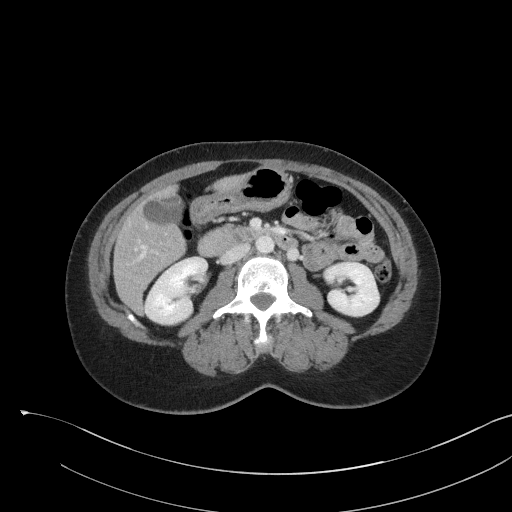
[im 55/85  bone]
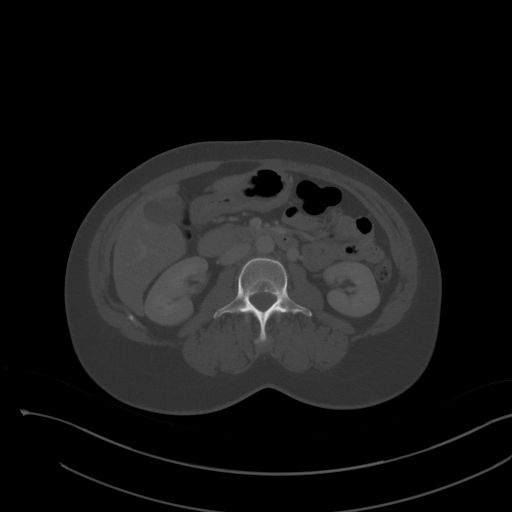
[im 60/85  soft-tissue]
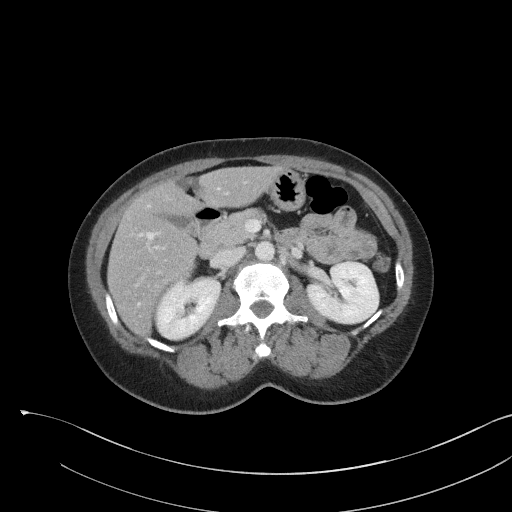
[im 65/85  soft-tissue]
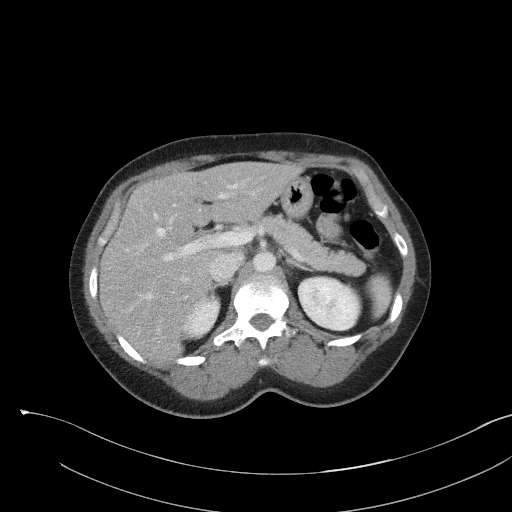
[im 75/85  soft-tissue]
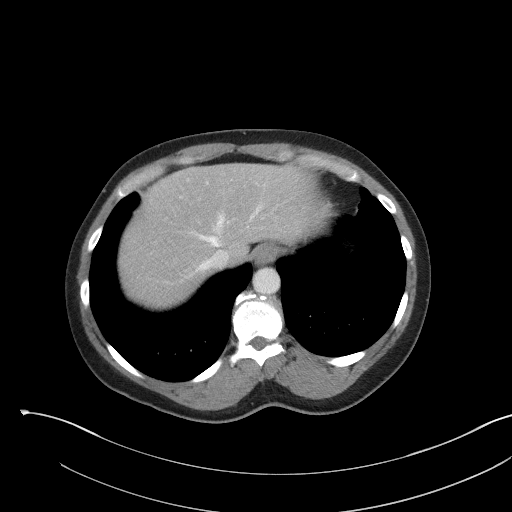
[im 80/85  soft-tissue]
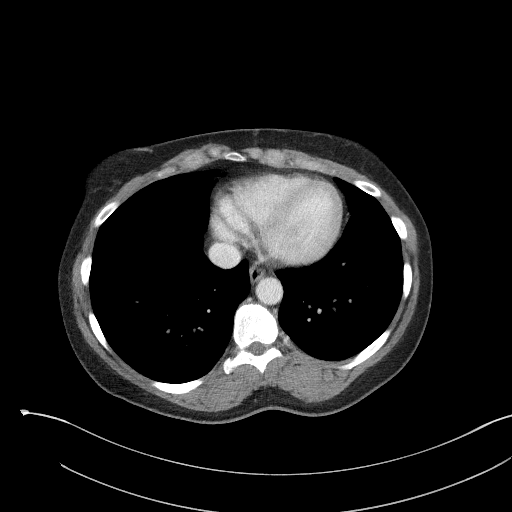

[Series 5: coronal st · coronal · 0.65mm/px · 3 of 89 slices shown]
[im 30/89  soft-tissue]
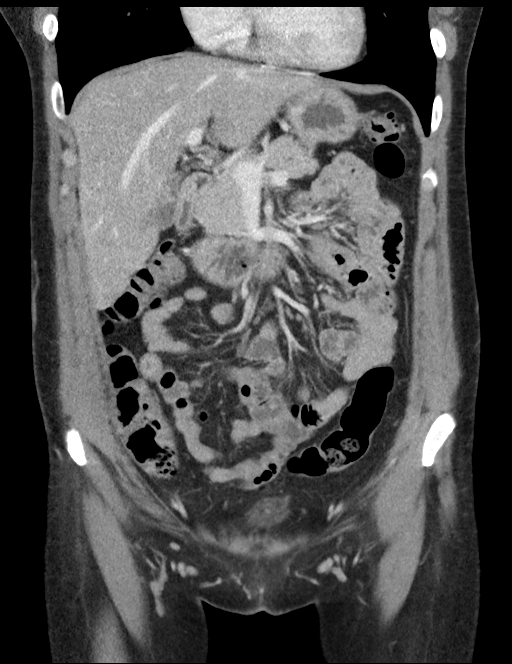
[im 40/89  soft-tissue]
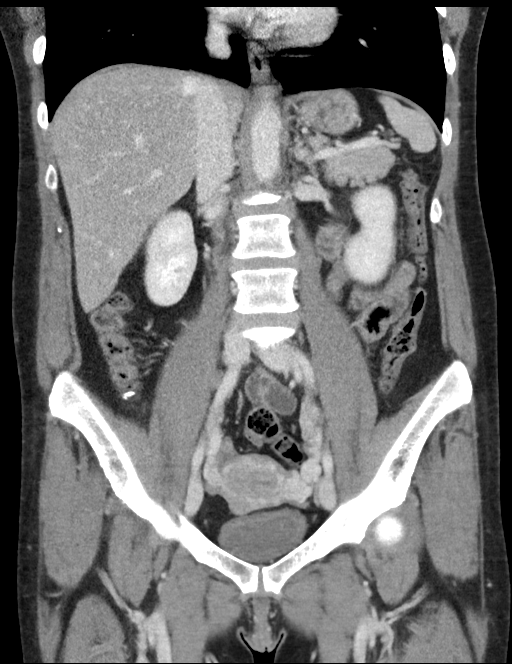
[im 49/89  soft-tissue]
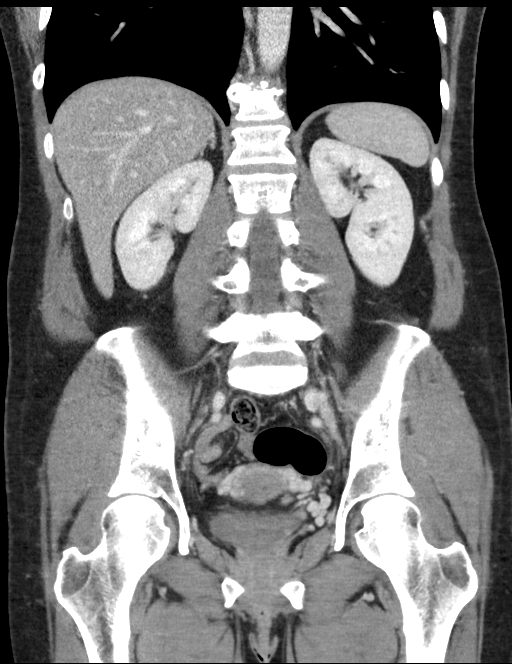

[16 of 46 positions shown; findings below may reference images not displayed]

FINDINGS: Lower chest: No acute abnormality.

Hepatobiliary: No focal liver abnormality is seen. No gallstones,
gallbladder wall thickening, or biliary dilatation.

Pancreas: Unremarkable. No pancreatic ductal dilatation or
surrounding inflammatory changes.

Spleen: Normal in size without focal abnormality.

Adrenals/Urinary Tract: Adrenal glands are unremarkable. Kidneys are
normal, without renal calculi, focal lesion, or hydronephrosis.
Bladder is unremarkable.

Stomach/Bowel: Stomach is within normal limits. Status post
appendectomy. No evidence of bowel wall thickening, distention, or
inflammatory changes.

Vascular/Lymphatic: No significant vascular findings are present. No
enlarged abdominal or pelvic lymph nodes.

Reproductive: Uterus and bilateral adnexa are unremarkable.
Prominent left greater than right parauterine vessels.

Other: Negative for free air or free fluid.

Musculoskeletal: No acute or significant osseous findings.
IMPRESSION: 1. No CT evidence for acute intra-abdominal or pelvic abnormality.
2. Prominent left greater than right parauterine vasculature,
questionable for pelvic congestion.

## 2017-10-21 IMAGING — CR DG KNEE COMPLETE 4+V*L*
4 series · 4 of 4 positions shown · non-contrast
Comparison: None.

CLINICAL DATA: Pain following fall

EXAM:
LEFT KNEE - COMPLETE 4+ VIEW

[t knee ap left]
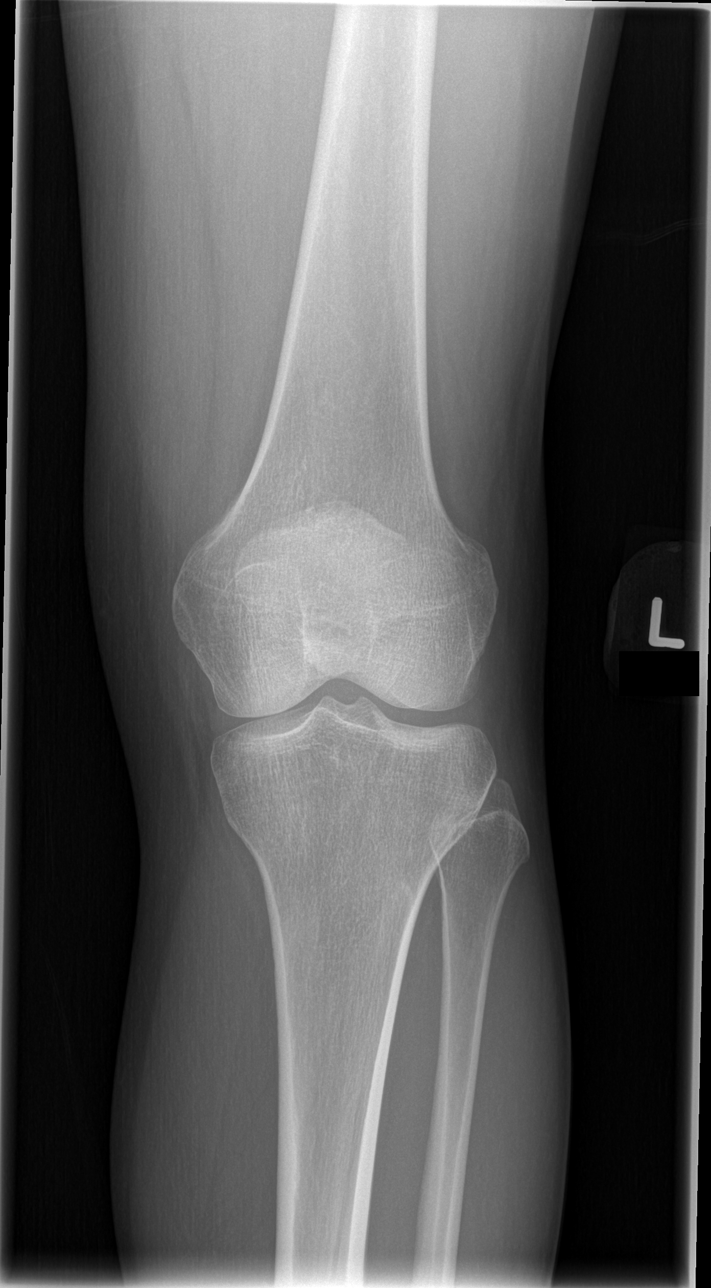

[t knee oblique left (1 of 2)]
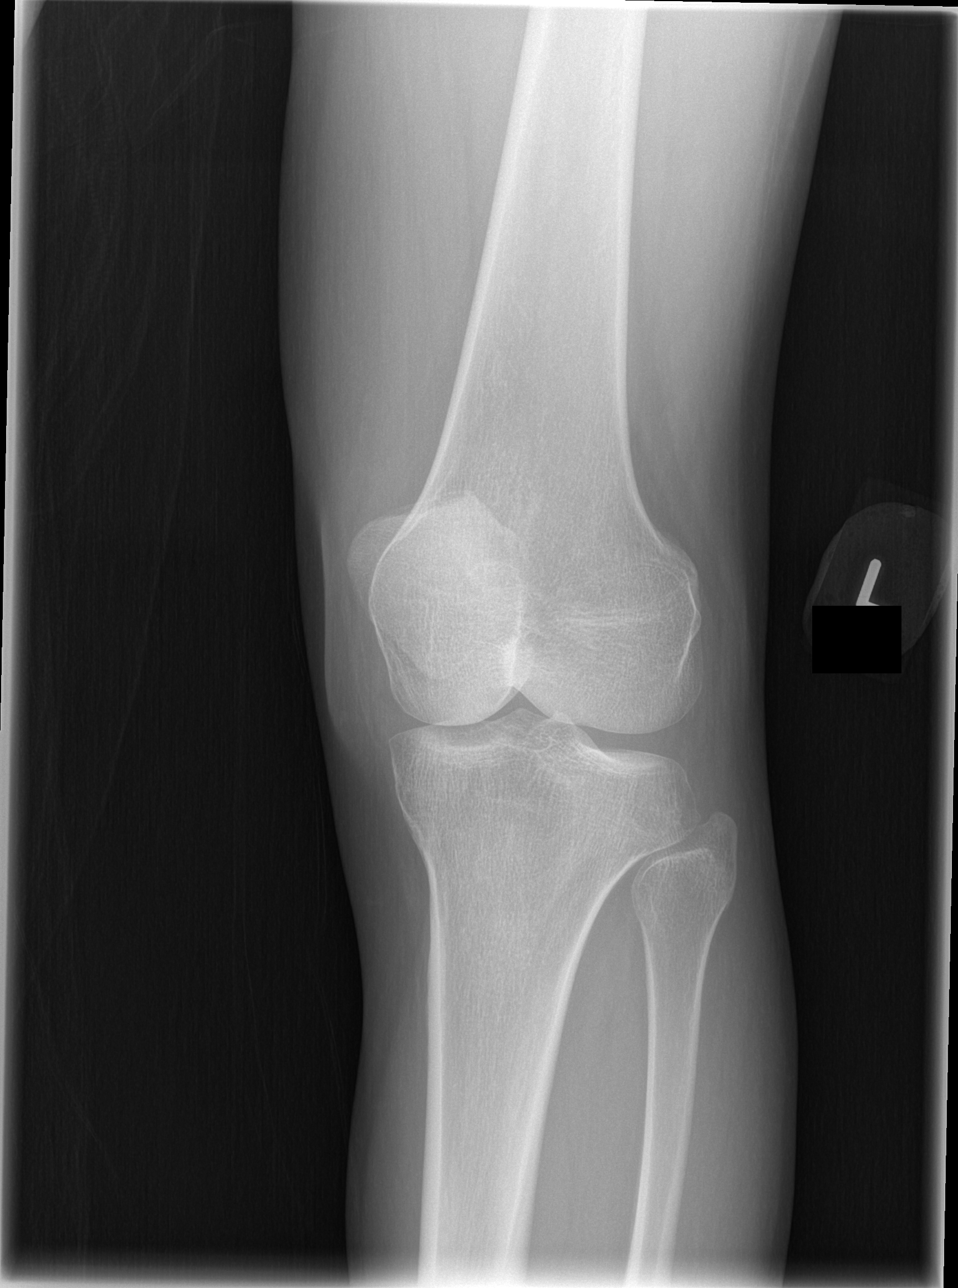

[t knee oblique left (2 of 2)]
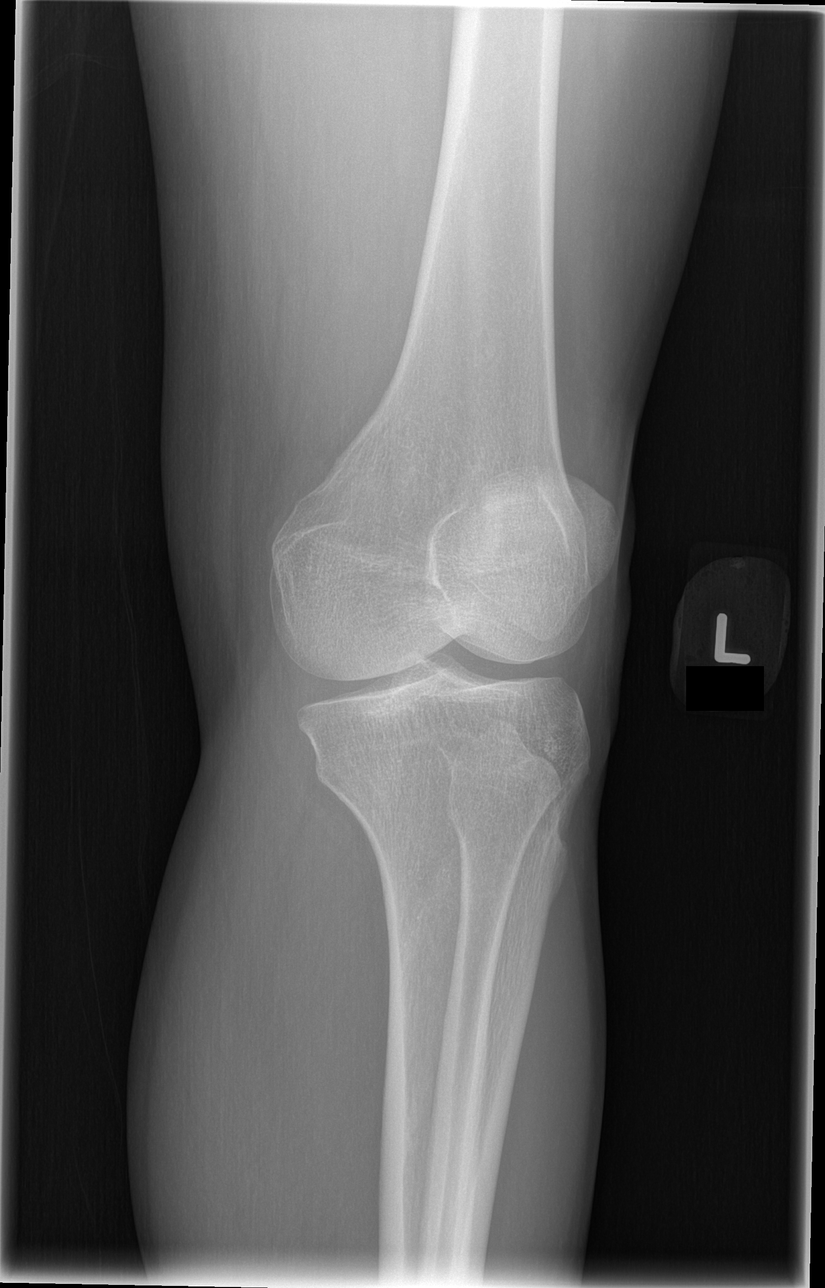

[t knee lat left]
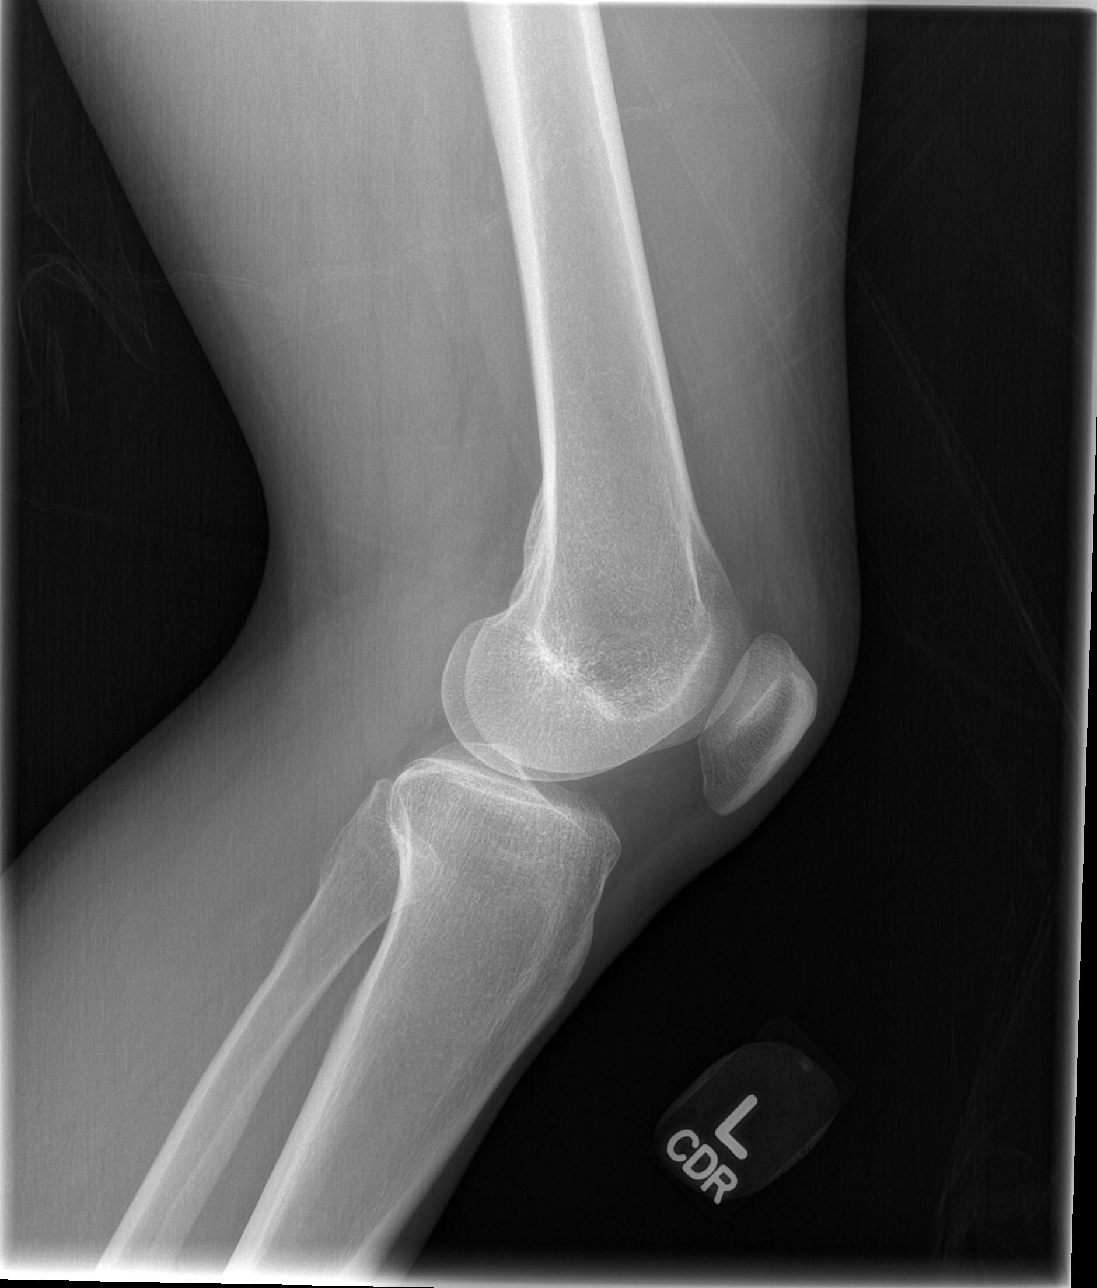

[4 of 4 positions shown; findings below may reference images not displayed]

FINDINGS: Frontal, lateral, and bilateral oblique views were obtained. There
is no appreciable fracture or dislocation. No joint effusion. Joint
spaces appear normal. No erosive change.
IMPRESSION: No fracture or joint effusion.  No appreciable arthropathy.

## 2017-10-21 IMAGING — CR DG THORACIC SPINE 2V
3 series · 3 of 3 positions shown · non-contrast
Comparison: None.

CLINICAL DATA: Pain following fall

EXAM:
THORACIC SPINE 3 VIEWS

[t t-spine a.p.]
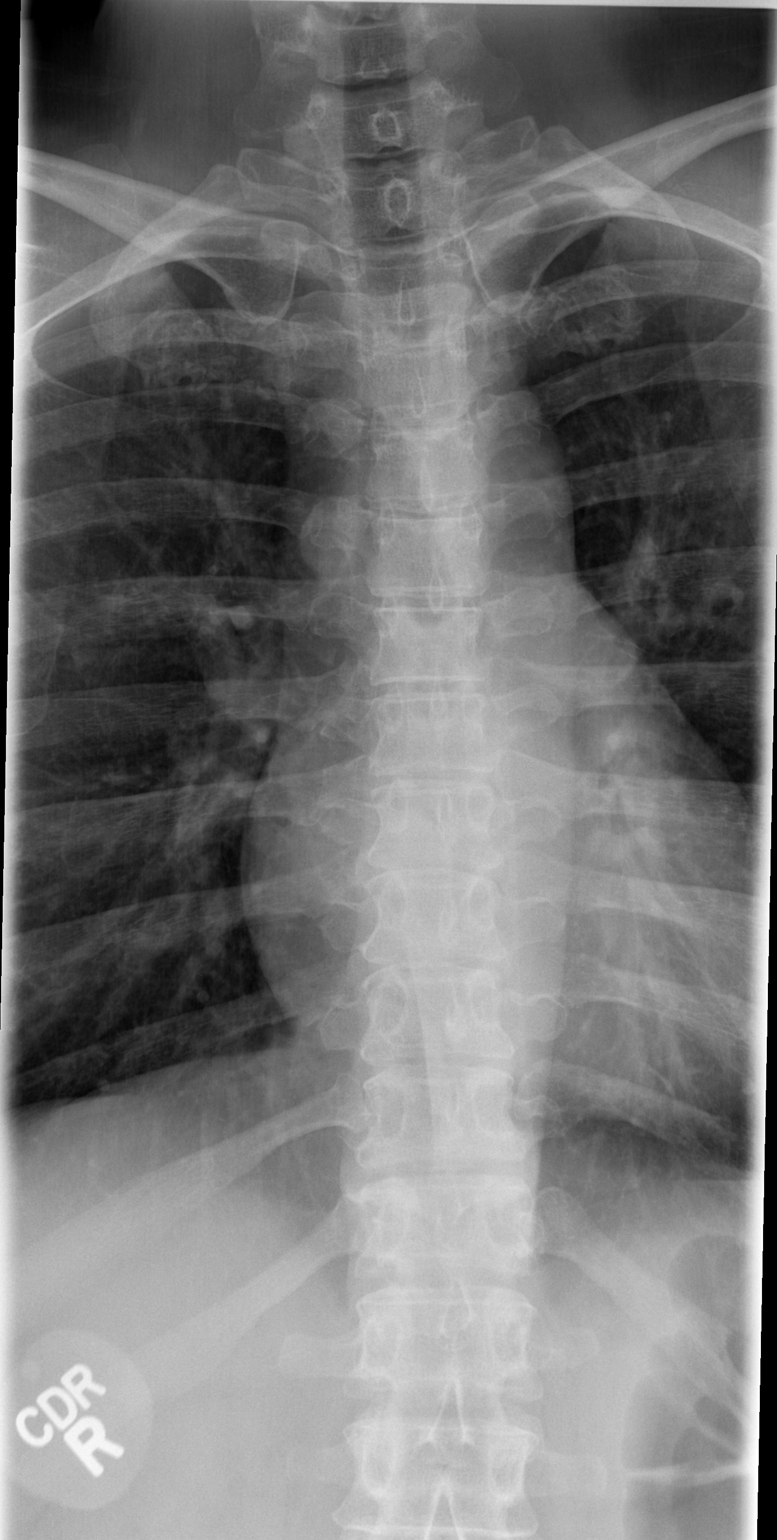

[t t-spine lat *]
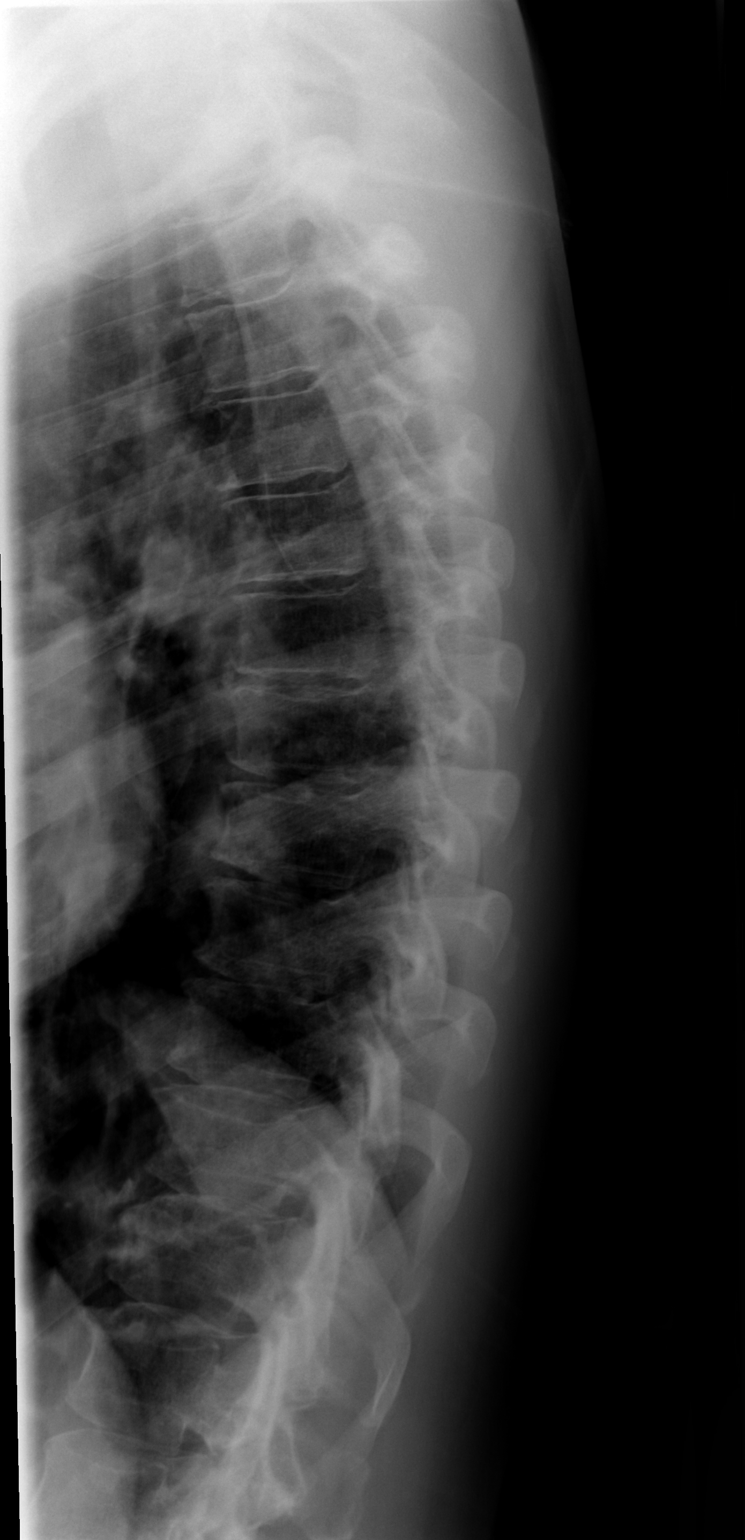

[t swimmers]
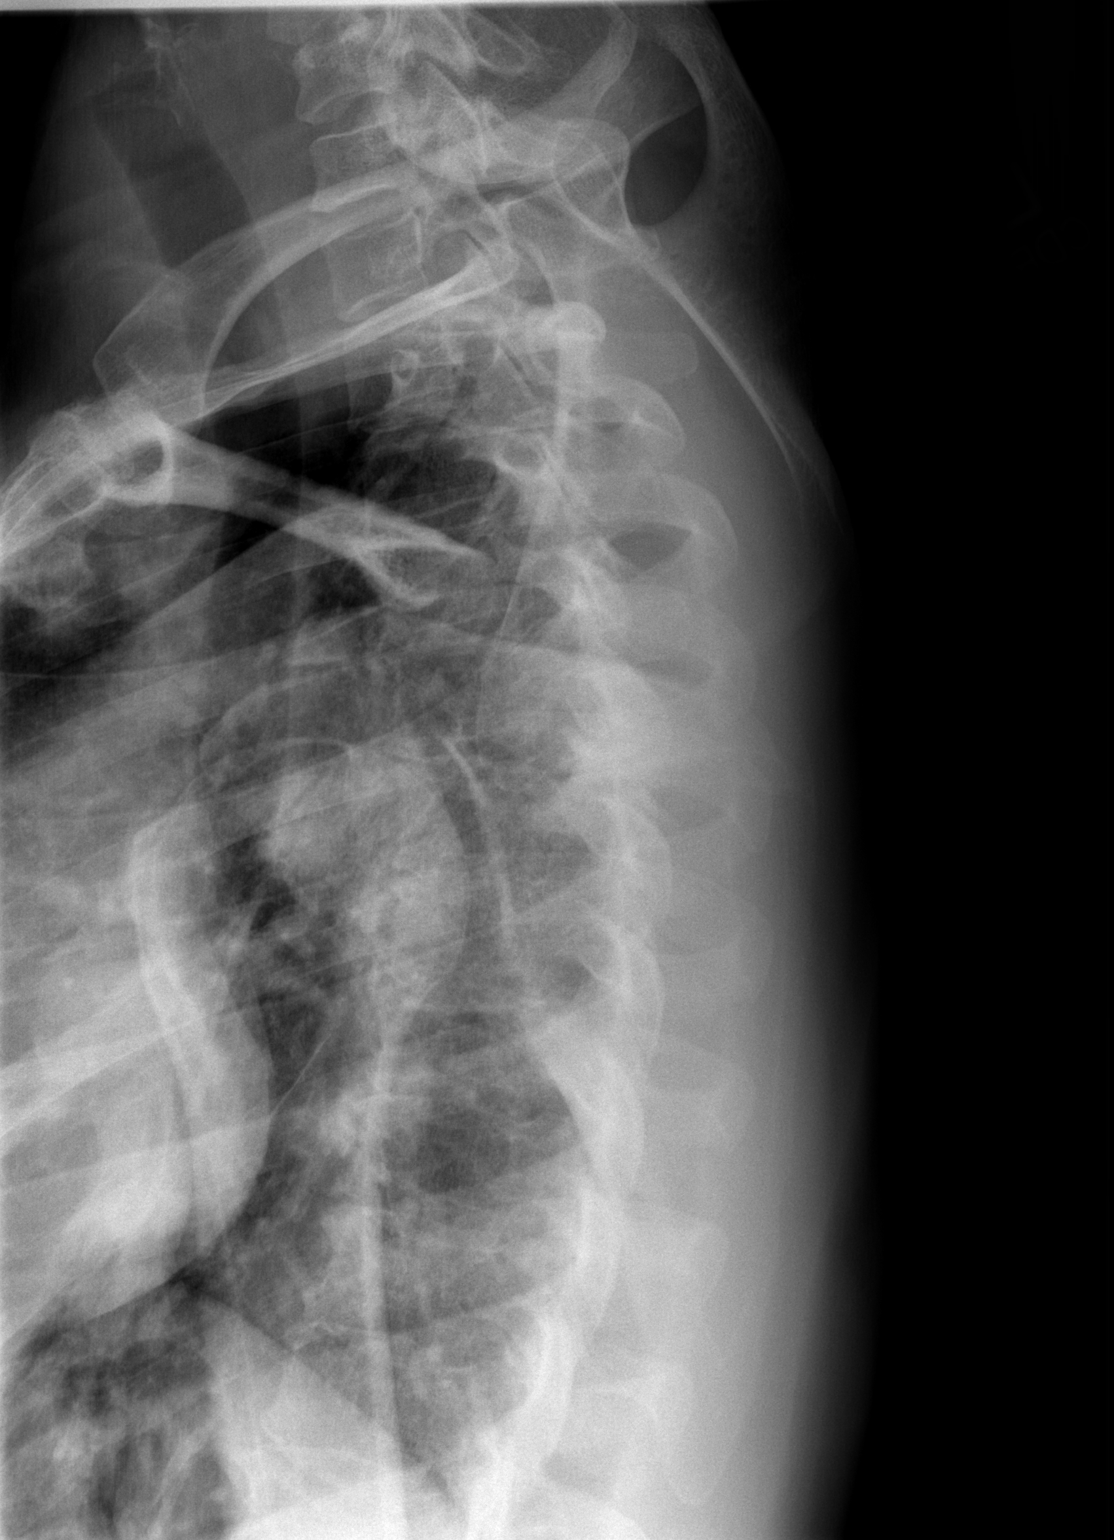

[3 of 3 positions shown; findings below may reference images not displayed]

FINDINGS: Frontal, lateral, and swimmer's views were obtained. No acute
fracture or spondylolisthesis. There is mild disc space narrowing at
several levels. No erosive change or paraspinous lesion.
IMPRESSION: Mild osteoarthritic change at several levels. No fracture or
spondylolisthesis.

## 2017-10-21 MED ORDER — IOPAMIDOL (ISOVUE-300) INJECTION 61%
100.0000 mL | Freq: Once | INTRAVENOUS | Status: AC | PRN
  Administered 2017-10-21: 100 mL via INTRAVENOUS

## 2017-10-21 MED ORDER — METHOCARBAMOL 500 MG PO TABS: 500.0000 mg | ORAL_TABLET | Freq: Two times a day (BID) | ORAL | 0 refills | Status: DC

## 2017-10-21 MED ORDER — KETOROLAC TROMETHAMINE 30 MG/ML IJ SOLN
30.0000 mg | Freq: Once | INTRAMUSCULAR | Status: AC
Start: 2017-10-21 — End: 2017-10-21
  Administered 2017-10-21: 30 mg via INTRAVENOUS
  Filled 2017-10-21: qty 1

## 2017-10-21 NOTE — ED Triage Notes (Signed)
Significant bruising to pts back, also reports bright red vaginal spotting since the time of the fall.

## 2017-10-21 NOTE — ED Provider Notes (Signed)
[place] EMERGENCY DEPARTMENT Provider Note   CSN: [id number] Arrival date & time: 10/21/17  1742     History   Chief Complaint Chief Complaint  Patient presents with  . Fall    HPI [name] is a 42 y.o. female who presents for evaluation after mechanical fall down 6 steps that occurred last night at approximately 8pm.  Patient reports she was carrying a crockpot down some stairs and states that she slipped and fell backwards and slid down 6 steps.  Patient reports she did not hit her head did not have any LOC.  Patient reports was able to recall the entire event.  Patient was able to ambulate after the incident. She is not currently on blood thinners.  Patient reports that since the incident, she has had bruising and pain to the left buttock, Bruising and pain to the left knee.  Patient also reports he has been having some lower abdominal pain.  Patient reports she has had some darker urination with blood on the toilet paper.  Patient reports she is also having some left-sided chest pain.  She reports she has been able to ambulate but with pain.  Patient reports she is currently not on any blood thinners.  She denies any vision changes, chest pain, difficulty breathing, numbness/weakness of arms or legs, nausea/vomiting, neck pain.   Patient states that she feels safe at home and states that this was an accident.  Denies any concerns for abuse or safety.  The history is provided by the patient.    Past Medical History:  Diagnosis Date  . History of cervical dysplasia    20 yrs ago     There are no active problems to display for this patient.   Past Surgical History:  Procedure Laterality Date  . APPENDECTOMY    . CESAREAN SECTION       OB History    Gravida  4   Para  3   Term  3   Preterm      AB  1   Living  3     SAB      TAB      Ectopic      Multiple      Live Births  4            Home Medications    Prior to  Admission medications   Medication Sig Start Date End Date Taking? Authorizing Provider  medroxyPROGESTERone (DEPO-PROVERA) 150 MG/ML injection Inject 1 mL (150 mg total) into the muscle every 3 (three) months. 08/17/17   Shelly Bombard, MD  methocarbamol (ROBAXIN) 500 MG tablet Take 1 tablet (500 mg total) by mouth 2 (two) times daily. 10/21/17   Volanda Napoleon, PA-C    Family History Family History  Problem Relation Age of Onset  . Hypertension Mother   . Breast cancer Mother   . Diabetes Father   . Hypertension Father   . Breast cancer Paternal Grandmother     Social History Social History   Tobacco Use  . Smoking status: Current Some Day Smoker    Types: Cigarettes  . Smokeless tobacco: Never Used  Substance Use Topics  . Alcohol use: Yes    Comment: occ.  . Drug use: No     Allergies   Patient has no known allergies.   Review of Systems Review of Systems  Constitutional: Negative for fever.  Eyes: Negative for visual disturbance.  Respiratory: Negative for cough and  shortness of breath.   Cardiovascular: Negative for chest pain.  Gastrointestinal: Positive for abdominal pain. Negative for diarrhea, nausea and vomiting.  Genitourinary: Positive for hematuria. Negative for dysuria.  Musculoskeletal: Positive for myalgias. Negative for back pain and neck pain.  Skin: Positive for color change. Negative for rash.  Neurological: Negative for dizziness, weakness, numbness and headaches.  All other systems reviewed and are negative.    Physical Exam Updated Vital Signs BP 140/90 (BP Location: Right Arm)   Pulse 79   Temp 98.3 F (36.8 C) (Oral)   Resp 18   Ht 5\' 3"  (1.6 m)   Wt 68 kg (150 lb)   SpO2 100%   BMI 26.57 kg/m   Physical Exam  Constitutional: She is oriented to person, place, and time. She appears well-developed and well-nourished.  HENT:  Head: Normocephalic and atraumatic.  Right Ear: Tympanic membrane normal. No hemotympanum.  Left  Ear: Tympanic membrane normal. No hemotympanum.  Mouth/Throat: Oropharynx is clear and moist and mucous membranes are normal.  No tenderness to palpation of skull. No deformities or crepitus noted. No open wounds, abrasions or lacerations.   Eyes: Pupils are equal, round, and reactive to light. Conjunctivae, EOM and lids are normal.  EOMs intact. No periorbital tenderness bilaterally.   Neck: Full passive range of motion without pain.  Full flexion/extension and lateral movement of neck fully intact. No bony midline tenderness. No deformities or crepitus.   Cardiovascular: Normal rate, regular rhythm, normal heart sounds and normal pulses. Exam reveals no gallop and no friction rub.  No murmur heard. Pulmonary/Chest: Effort normal and breath sounds normal.  No evidence of respiratory distress. Able to speak in full sentences without difficulty. Tenderness to palpation noted to the left lateral chest wall.     Abdominal: Soft. Normal appearance and bowel sounds are normal. There is generalized tenderness. There is no rigidity and no guarding.  Abdomen is soft, non-distended. Diffuse generalized tenderness. No rigidity, No guarding.  No CVA tenderness bilaterally.   Musculoskeletal: Normal range of motion.       Thoracic back: She exhibits tenderness.       Lumbar back: She exhibits tenderness.  Tenderness to palpation noted to the left thumb.  No tenderness palpation of left wrist.  Flexion/extension of left wrist intact without difficulty.  No tenderness palpation the left elbow.  Flexion/extension of left elbow intact without difficulty.  No tenderness palpation the left shoulder.  Full range of motion of left shoulder intact.  No abnormalities of the right upper extremity.  Tenderness palpation noted to the anterior aspect of the left knee.  There is some overlying soft tissue swelling.  No deformity or crepitus noted.  Negative posterior and anterior drawer test.  No instability noted on varus  or valgus stress.  No abnormalities of the right lower extremity.  Diffuse tenderness over the midline T and L-spine.  No deformity or crepitus noted.  Neurological: She is alert and oriented to person, place, and time.  Cranial nerves III-XII intact Follows commands, Moves all extremities  5/5 strength to BUE and BLE  Sensation intact throughout all major nerve distributions Normal finger to nose. No dysdiadochokinesia. No pronator drift. No gait abnormalities  No slurred speech. No facial droop.   Skin: Skin is warm and dry. Capillary refill takes less than 2 seconds.     Skin abrasion noted to the anterior left thumb.  Psychiatric: She has a normal mood and affect. Her speech is normal.  Nursing note  and vitals reviewed.    ED Treatments / Results  Labs (all labs ordered are listed, but only abnormal results are displayed) Labs Reviewed  CBC WITH DIFFERENTIAL/PLATELET - Abnormal; Notable for the following components:      Result Value   HCT 35.8 (*)    All other components within normal limits  BASIC METABOLIC PANEL  PREGNANCY, URINE    EKG None  Radiology Dg Chest 2 View  Result Date: 10/21/2017 CLINICAL DATA:  Pain following fall EXAM: CHEST - 2 VIEW COMPARISON:  None. FINDINGS: Lungs are clear. Heart size and pulmonary vascularity are normal. No pneumothorax. No adenopathy. No bone lesions. IMPRESSION: No edema or consolidation. Electronically Signed   By: Lowella Grip III M.D.   On: 10/21/2017 21:19   Dg Thoracic Spine 2 View  Result Date: 10/21/2017 CLINICAL DATA:  Pain following fall EXAM: THORACIC SPINE 3 VIEWS COMPARISON:  None. FINDINGS: Frontal, lateral, and swimmer's views were obtained. No acute fracture or spondylolisthesis. There is mild disc space narrowing at several levels. No erosive change or paraspinous lesion. 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 No fracture or spondylolisthesis. Electronically Signed   By: Lowella Grip  III M.D.   On: 10/21/2017 21:21   Ct Abdomen Pelvis W Contrast  Result Date: 10/21/2017 CLINICAL DATA:  [address] down steps tailbone back and rib pain vaginal spotting EXAM: CT ABDOMEN AND PELVIS WITH CONTRAST TECHNIQUE: Multidetector CT imaging of the abdomen and pelvis was performed using the standard protocol following bolus administration of intravenous contrast. CONTRAST:  122mL ISOVUE-300 IOPAMIDOL (ISOVUE-300) INJECTION 61% COMPARISON:  11/22/2010 FINDINGS: Lower chest: No acute abnormality. Hepatobiliary: No focal liver abnormality is seen. No gallstones, gallbladder wall thickening, or biliary dilatation. Pancreas: Unremarkable. No pancreatic ductal dilatation or surrounding inflammatory changes. Spleen: Normal in size without focal abnormality. Adrenals/Urinary Tract: Adrenal glands are unremarkable. Kidneys are normal, without renal calculi, focal lesion, or hydronephrosis. Bladder is unremarkable. Stomach/Bowel: Stomach is within normal limits. Status post appendectomy. No evidence of bowel wall thickening, distention, or inflammatory changes. Vascular/Lymphatic: No significant vascular findings are present. No enlarged abdominal or pelvic lymph nodes. Reproductive: Uterus and bilateral adnexa are unremarkable. Prominent left greater than right parauterine vessels. Other: Negative for free air or free fluid. Musculoskeletal: No acute or significant osseous findings. IMPRESSION: 1. No CT evidence for acute intra-abdominal or pelvic abnormality. 2. Prominent left greater than right parauterine vasculature, questionable for pelvic congestion. Electronically Signed   By: Donavan Foil M.D.   On: 10/21/2017 21:35   Ct L-spine No Charge  Result Date: 10/21/2017 CLINICAL DATA:  Fall down steps with back bruising and tailbone pain. Initial encounter. EXAM: CT lumbar spine with contrast TECHNIQUE: Multiplanar CT images of the thoracic spine were reconstructed from contemporary CT of the Chest. CONTRAST:  None  or No additional COMPARISON:  None FINDINGS: Alignment: Normal Vertebrae: Negative for fracture Paraspinal and other soft tissues: Left gluteal subcutaneous reticulation correlating with history of bruising. Disc levels: No visible herniation or impingement. IMPRESSION: 1. Negative for lumbar spine fracture. 2. Left gluteal subcutaneous contusion. Electronically Signed   By: Monte Fantasia M.D.   On: 10/21/2017 21:26   Dg Knee Complete 4 Views Left  Result Date: 10/21/2017 CLINICAL DATA:  Pain following fall EXAM: LEFT KNEE - COMPLETE 4+ VIEW COMPARISON:  None. FINDINGS: Frontal, lateral, and bilateral oblique views were obtained. There is no appreciable fracture or dislocation. No joint effusion. Joint spaces appear normal. No erosive change. IMPRESSION: No fracture or joint effusion.  No appreciable arthropathy. Electronically Signed   By: Lowella Grip III M.D.   On: 10/21/2017 21:22   Dg Hand Complete Left  Result Date: 10/21/2017 CLINICAL DATA:  Pain following fall EXAM: LEFT HAND - COMPLETE 3+ VIEW COMPARISON:  None. FINDINGS: Frontal, oblique, and lateral views were obtained. There is a fracture along the lateral aspect of the proximal portion of the first distal phalanx with alignment near anatomic. No other fracture. No dislocation. Joint spaces appear unremarkable. No erosive change. IMPRESSION: Fracture along the lateral proximal aspect of the first distal phalanx with alignment near anatomic. No other fracture. No dislocation. No evident arthropathic change. Electronically Signed   By: Lowella Grip III M.D.   On: 10/21/2017 21:24    Procedures Procedures (including critical care time)  Medications Ordered in ED Medications  iopamidol (ISOVUE-300) 61 % injection 100 mL (100 mLs Intravenous Contrast Given 10/21/17 2054)  ketorolac (TORADOL) 30 MG/ML injection 30 mg (30 mg Intravenous Given 10/21/17 2212)     Initial Impression / Assessment and Plan / ED Course  I have reviewed  the triage vital signs and the nursing notes.  Pertinent labs & imaging results that were available during my care of the patient were reviewed by me and considered in my medical decision making (see chart for details).     42 y.o. F who presents for evaluation after mechanical fall that occurred last night.  Patient reports she was carrying a clock down down steps and slipped and fell down 6 steps.  Feet.  Was able to call the entire event.  Has been ambulatory since.  Patient not currently on blood thinners.  Comes to the ED complaining of left gluteal pain, left lateral chest wall pain, left hand pain, left knee pain.  Additionally, patient is having lower abdominal pain.  Patient reports she has had intermittent episodes of hematuria since the incident.  No nausea/vomiting. Patient is afebrile, non-toxic appearing, sitting comfortably on examination table. Vital signs reviewed and stable.  Patient with tenderness noted to the T and L-spine.  Patient also with lower abdominal tenderness.  No neuro deficits noted on exam.  No C spine tenderness. Given reassuring physical exam and per [facility] CT criteria, no imaging is indicated at this time.  Given reassuring exam and NEXUS criteria, no indication for cervical imaging at this time.  Patient also with letter left lateral chest wall pain.  Additionally, patient is tender to the left hand and left knee.  Will plan for imaging evaluation of left upper extremity, chest.  We will also get imaging of T and L-spine.  Additionally, will plan to proceed for evaluation of possible intra-abdominal bleeding as patient does have some hematuria noted.  Analgesics provided in the department.  BMP with no acute abnormalities.  CBC negative for any acute leukocytosis or anemia.  Urine pregnancy negative.  No evidence of lumbar spine fracture.  There is a left gluteal subcutaneous contusion noted.  CT abdomen pelvis shows no acute intra-abdominal or pelvic abnormality.   Hand x-ray shows fracture along the lateral proximal aspect of the first distal phalanx.  Knee x-ray is negative for any acute abnormalities.  T-spine x-ray shows no evidence of acute fracture dislocation.  Chest x-ray negative for any acute abnormality.  Discussed results with patient.  She reports improvement in pain after analgesics.  Reevaluation of patient's left thumb.  Patient has a abrasion noted to the anterior aspect of the left thumb.  On initial evaluation, the area  appeared to be a small 0.5 cm laceration.  It does not appear to directly overlying the fracture.  Given that is been greater than 12 hours, no indication for repair.  I thoroughly cleaned and irrigated the wound with sterile saline.  After the wound had been irrigated, the wound was thoroughly evaluated.  Full evaluation of the wound showed it was not a laceration but instead more of a skin abrasion.  It was superficial and did not extend deep.  No concern for open fracture.  Plan to p.o. challenge patient in the department.  Patient able to tolerate p.o. in the department any difficulty.  He has been ambulating in the department without any difficulty.  We will plan to splint patient in the ED.  Patient instructed to follow-up with primary care in the next 2-4 days for further evaluation.  And encourage supportive at home measures. Patient had ample opportunity for questions and discussion. All patient's questions were answered with full understanding. Strict return precautions discussed. Patient expresses understanding and agreement to plan.   Final Clinical Impressions(s) / ED Diagnoses   Final diagnoses:  Fall, initial encounter  Hematoma  Muscle strain  Abrasion  Closed nondisplaced fracture of proximal phalanx of left thumb, initial encounter    ED Discharge Orders        Ordered    methocarbamol (ROBAXIN) 500 MG tablet  2 times daily     10/21/17 2258       Desma Mcgregor 10/23/17 Augustin Coupe, MD 10/23/17 [phone]

## 2017-10-21 NOTE — Discharge Instructions (Signed)
You can take Tylenol or Ibuprofen as directed for pain. You can alternate Tylenol and Ibuprofen every 4 hours. If you take Tylenol at 1pm, then you can take Ibuprofen at 5pm. Then you can take Tylenol again at 9pm.   Take Robaxin as prescribed. This medication will make you drowsy so do not drive or drink alcohol when taking it.  Follow-up with referred hand doctor as directed for your finger fracture.  Call their office and arrange for an appointment.  Keep your wounds clean and dry.  Apply ice to the affected area.  Return to the emergency department for any vision changes, nausea/vomiting, chest pain, difficulty breathing, redness or swelling of the hand, numbness/weakness of the hands or any other worsening or concerning symptoms.

## 2017-10-21 NOTE — ED Triage Notes (Signed)
Pt reports that she fell down the steps and leanded on her left side. Reports tailbone pain, back pain and has left rib pain.

## 2017-11-08 ENCOUNTER — Ambulatory Visit: Payer: Medicaid Other

## 2017-11-13 ENCOUNTER — Telehealth: Payer: Self-pay | Admitting: *Deleted

## 2017-11-13 NOTE — Telephone Encounter (Signed)
Lft vmail for patient to callback and reschedule missed DEPO appt on 11/08/2017.

## 2017-11-15 ENCOUNTER — Ambulatory Visit (INDEPENDENT_AMBULATORY_CARE_PROVIDER_SITE_OTHER): Payer: Medicaid Other | Admitting: *Deleted

## 2017-11-15 VITALS — BP 140/85 | HR 75 | Wt 134.0 lb

## 2017-11-15 DIAGNOSIS — Z3042 Encounter for surveillance of injectable contraceptive: Secondary | ICD-10-CM | POA: Diagnosis not present

## 2017-11-15 MED ORDER — MEDROXYPROGESTERONE ACETATE 150 MG/ML IM SUSP
150.0000 mg | Freq: Once | INTRAMUSCULAR | Status: AC
  Administered 2017-11-15: 150 mg via INTRAMUSCULAR

## 2017-11-15 NOTE — Progress Notes (Signed)
Pt is in office for depo injection. Pt is on time for depo. Injection given, pt tolerated well. Pt advised to RTO July 25-Aug 8 for next depo.  BP 140/85   Pulse 75   Wt 134 lb (60.8 kg)   BMI 23.74 kg/m     Administrations This Visit    medroxyPROGESTERone (DEPO-PROVERA) injection 150 mg    Admin Date 11/15/2017 Action Given Dose 150 mg Route Intramuscular Administered By Valene Bors, CMA

## 2017-11-15 NOTE — Progress Notes (Signed)
I have reviewed the chart and agree with nursing staff's documentation of this patient's encounter.  Morene Crocker, CNM 11/15/2017 3:35 PM

## 2018-02-22 ENCOUNTER — Other Ambulatory Visit: Payer: Self-pay

## 2018-02-22 ENCOUNTER — Encounter (HOSPITAL_COMMUNITY): Payer: Self-pay | Admitting: Emergency Medicine

## 2018-02-22 ENCOUNTER — Emergency Department (HOSPITAL_COMMUNITY)
Admission: EM | Admit: 2018-02-22 | Discharge: 2018-02-22 | Disposition: A | Discharge status: Home / Self Care | Payer: Medicaid Other | Attending: Emergency Medicine | Admitting: Emergency Medicine

## 2018-02-22 DIAGNOSIS — F17210 Nicotine dependence, cigarettes, uncomplicated: Secondary | ICD-10-CM | POA: Diagnosis not present

## 2018-02-22 DIAGNOSIS — Z79899 Other long term (current) drug therapy: Secondary | ICD-10-CM | POA: Diagnosis not present

## 2018-02-22 DIAGNOSIS — R4689 Other symptoms and signs involving appearance and behavior: Secondary | ICD-10-CM | POA: Diagnosis not present
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
## 2018-02-22 NOTE — ED Provider Notes (Signed)
[facility] DEPT Provider Note   CSN: [id number] Arrival date & time: 02/22/18  1635     History   Chief Complaint Chief Complaint  Patient presents with  . Medical Clearance    HPI [name] is a 42 y.o. female.  The history is provided by the patient, a caregiver and the police.  Mental Health Problem  Presenting symptoms: aggressive behavior   Presenting symptoms: no agitation, no hallucinations, no self-mutilation and no suicidal thoughts   Patient accompanied by:  Law enforcement Degree of incapacity (severity):  Mild Onset quality:  Sudden Progression:  Resolved Chronicity:  New Context: alcohol use   Treatment compliance:  Untreated Relieved by:  Nothing Worsened by:  Family interactions Associated symptoms: poor judgment   Associated symptoms: no abdominal pain, no anxiety, no chest pain and no feelings of worthlessness   Risk factors: no family violence and no hx of mental illness     Past Medical History:  Diagnosis Date  . History of cervical dysplasia    20 yrs ago     There are no active problems to display for this patient.   Past Surgical History:  Procedure Laterality Date  . APPENDECTOMY    . CESAREAN SECTION       OB History    Gravida  4   Para  3   Term  3   Preterm      AB  1   Living  3     SAB      TAB      Ectopic      Multiple      Live Births  4            Home Medications    Prior to Admission medications   Medication Sig Start Date End Date Taking? Authorizing Provider  medroxyPROGESTERone (DEPO-PROVERA) 150 MG/ML injection Inject 1 mL (150 mg total) into the muscle every 3 (three) months. 08/17/17   Shelly Bombard, MD  methocarbamol (ROBAXIN) 500 MG tablet Take 1 tablet (500 mg total) by mouth 2 (two) times daily. 10/21/17   Volanda Napoleon, PA-C    Family History Family History  Problem Relation Age of Onset  . Hypertension Mother   . Breast cancer  Mother   . Diabetes Father   . Hypertension Father   . Breast cancer Paternal Grandmother     Social History Social History   Tobacco Use  . Smoking status: Current Some Day Smoker    Types: Cigarettes  . Smokeless tobacco: Never Used  Substance Use Topics  . Alcohol use: Yes    Comment: occ.  . Drug use: No     Allergies   Patient has no known allergies.   Review of Systems Review of Systems  Constitutional: Negative for chills and fever.  HENT: Negative for ear pain and sore throat.   Eyes: Negative for pain and visual disturbance.  Respiratory: Negative for cough and shortness of breath.   Cardiovascular: Negative for chest pain and palpitations.  Gastrointestinal: Negative for abdominal pain and vomiting.  Genitourinary: Negative for dysuria and hematuria.  Musculoskeletal: Negative for arthralgias and back pain.  Skin: Negative for color change and rash.  Neurological: Negative for seizures and syncope.  Psychiatric/Behavioral: Negative for agitation, confusion, decreased concentration, hallucinations, self-injury, sleep disturbance and suicidal ideas. The patient is not nervous/anxious and is not hyperactive.   All other systems reviewed and are negative.    Physical Exam Updated  Vital Signs  ED Triage Vitals  Enc Vitals Group     BP 02/22/18 1656 116/83     Pulse Rate 02/22/18 1656 96     Resp 02/22/18 1656 18     Temp 02/22/18 1656 98.4 F (36.9 C)     Temp Source 02/22/18 1656 Oral     SpO2 02/22/18 1656 100 %     Weight 02/22/18 1713 145 lb (65.8 kg)     Height 02/22/18 1713 5\' 3"  (1.6 m)     Head Circumference --      Peak Flow --      Pain Score 02/22/18 1713 0     Pain Loc --      Pain Edu? --      Excl. in [place]? --     Physical Exam  Constitutional: She is oriented to person, place, and time. She appears well-developed and well-nourished. No distress.  HENT:  Head: Normocephalic and atraumatic.  Eyes: Pupils are equal, round, and reactive  to light. Conjunctivae and EOM are normal.  Neck: Neck supple.  Cardiovascular: Normal rate, regular rhythm, normal heart sounds and intact distal pulses.  No murmur heard. Pulmonary/Chest: Effort normal and breath sounds normal. No respiratory distress.  Abdominal: Soft. There is no tenderness.  Musculoskeletal: Normal range of motion. She exhibits no edema.  Neurological: She is alert and oriented to person, place, and time.  Skin: Skin is warm and dry.  Psychiatric: She has a normal mood and affect. Her speech is normal and behavior is normal. Judgment and thought content normal. Cognition and memory are normal.  Nursing note and vitals reviewed.    ED Treatments / Results  Labs (all labs ordered are listed, but only abnormal results are displayed) Labs Reviewed - No data to display  EKG None  Radiology No results found.  Procedures Procedures (including critical care time)  Medications Ordered in ED Medications - No data to display   Initial Impression / Assessment and Plan / ED Course  I have reviewed the triage vital signs and the nursing notes.  Pertinent labs & imaging results that were available during my care of the patient were reviewed by me and considered in my medical decision making (see chart for details).     [name] is a 42 year old female with no significant medical history who presents to the ED with aggressive behavior.  Patient with normal vitals.  Patient had IVC filled out by family for aggressive behavior.  Patient is overall well-appearing.  Denies suicidal ideation, homicidal ideation, hallucinations.  Patient denies any drug or alcohol use today.  Patient appears clinically sober.  Talked with patient's mother and father who do not want patient to return home.  Was able to contact social work and they talked with the patient.  They believe patient is safe for discharge.  Patient given resources about possible drug and alcohol rehab.   Patient is safe at this time.  She has places to stay with friends and other family members.  Discharge from the ED in good condition.  IVC was rescinded and patient was discharged from the ED in good condition.  Final Clinical Impressions(s) / ED Diagnoses   Final diagnoses:  Aggressive behavior    ED Discharge Orders    None       Lennice Sites, DO 02/22/18 1845

## 2018-02-22 NOTE — ED Notes (Signed)
Pt discharged home. Discharged instructions read to pt who verbalized understanding. All belongings returned to pt who signed for same. Denies SI/HI, is not delusional and not responding to internal stimuli. Escorted pt to the ED exit.    

## 2018-02-22 NOTE — ED Notes (Signed)
Pt was given scrubs to change into, pt phone was taken, belongings were placed in bag and placed in locker #28.   Pt had a gray tshirt, a pair of jeans, and a white baseball cap, and one pair of shoes, cell phone was also placed in belongings bag with other belongings.

## 2018-02-22 NOTE — ED Notes (Signed)
Lennice Sites, DO said that he would order labs.
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
## 2018-03-22 ENCOUNTER — Encounter (HOSPITAL_COMMUNITY): Payer: Self-pay | Admitting: Emergency Medicine

## 2018-03-22 ENCOUNTER — Ambulatory Visit (HOSPITAL_COMMUNITY)
Admission: EM | Admit: 2018-03-22 | Discharge: 2018-03-22 | Disposition: A | Discharge status: Home / Self Care | Payer: Medicaid Other | Attending: Nurse Practitioner | Admitting: Nurse Practitioner

## 2018-03-22 DIAGNOSIS — M79641 Pain in right hand: Secondary | ICD-10-CM | POA: Diagnosis not present

## 2018-03-22 MED ORDER — DEXAMETHASONE SODIUM PHOSPHATE 10 MG/ML IJ SOLN
10.0000 mg | Freq: Once | INTRAMUSCULAR | Status: AC
  Administered 2018-03-22: 10 mg via INTRAMUSCULAR

## 2018-03-22 MED ORDER — METHYLPREDNISOLONE 4 MG PO TBPK: ORAL_TABLET | ORAL | 0 refills | Status: DC

## 2018-03-22 MED ORDER — DEXAMETHASONE SODIUM PHOSPHATE 10 MG/ML IJ SOLN
INTRAMUSCULAR | Status: AC
  Filled 2018-03-22: qty 1

## 2018-03-22 MED ORDER — KETOROLAC TROMETHAMINE 60 MG/2ML IM SOLN
60.0000 mg | Freq: Once | INTRAMUSCULAR | Status: AC
  Administered 2018-03-22: 60 mg via INTRAMUSCULAR

## 2018-03-22 MED ORDER — KETOROLAC TROMETHAMINE 60 MG/2ML IM SOLN
INTRAMUSCULAR | Status: AC
  Filled 2018-03-22: qty 2

## 2018-03-22 MED ORDER — DICLOFENAC SODIUM 50 MG PO TBEC: 50.0000 mg | DELAYED_RELEASE_TABLET | Freq: Three times a day (TID) | ORAL | 0 refills | Status: DC | PRN

## 2018-03-22 NOTE — Discharge Instructions (Signed)
Wear splint for comfort. May continue ice if you find it helpful. Take medications as prescribed. Follow-up with orthopedics. Call as soon as possible to schedule an appointment.

## 2018-03-22 NOTE — ED Provider Notes (Signed)
[facility]    CSN: [id number] Arrival date & time: 03/22/18  8250     History   Chief Complaint Chief Complaint  Patient presents with  . Hand Pain    HPI [name] is a 42 y.o. female.   Subjective:  [name] is a 42 y.o. female who presents for evaluation of right hand pain. Onset was sudden, starting about several months ago. The pain is to the entire hand and radiates up into the wrist and forearm. She also reports that her fingernails have been "falling off." She denies any injury or trauma. No fevers. The pain has progressively worsened since its onset. It is now severe, worsens with movement, and is relieved by nothing. There is no associated numbness, tingling or weakness in the extremity. Evaluation to date: none. Treatment to date: OTC analgesics and ice with not much improvement in symptoms.   The following portions of the patient's history were reviewed and updated as appropriate: allergies, current medications, past family history, past medical history, past social history, past surgical history and problem list.        Past Medical History:  Diagnosis Date  . History of cervical dysplasia    20 yrs ago     There are no active problems to display for this patient.   Past Surgical History:  Procedure Laterality Date  . APPENDECTOMY    . CESAREAN SECTION      OB History    Gravida  4   Para  3   Term  3   Preterm      AB  1   Living  3     SAB      TAB      Ectopic      Multiple      Live Births  4            Home Medications    Prior to Admission medications   Medication Sig Start Date End Date Taking? Authorizing Provider  diclofenac (VOLTAREN) 50 MG EC tablet Take 1 tablet (50 mg total) by mouth 3 (three) times daily as needed. 03/22/18   Enrique Sack, FNP  medroxyPROGESTERone (DEPO-PROVERA) 150 MG/ML injection Inject 1 mL (150 mg total) into the muscle every 3 (three) months. 08/17/17    Shelly Bombard, MD  methocarbamol (ROBAXIN) 500 MG tablet Take 1 tablet (500 mg total) by mouth 2 (two) times daily. 10/21/17   Volanda Napoleon, PA-C  methylPREDNISolone (MEDROL DOSEPAK) 4 MG TBPK tablet Take as directed 03/22/18   Enrique Sack, FNP    Family History Family History  Problem Relation Age of Onset  . Hypertension Mother   . Breast cancer Mother   . Diabetes Father   . Hypertension Father   . Breast cancer Paternal Grandmother     Social History Social History   Tobacco Use  . Smoking status: Current Some Day Smoker    Types: Cigarettes  . Smokeless tobacco: Never Used  Substance Use Topics  . Alcohol use: Yes    Comment: occ.  . Drug use: No     Allergies   Patient has no known allergies.   Review of Systems Review of Systems  Constitutional: Negative for fever.  Musculoskeletal:       Right hand, wrist and forearm pain   All other systems reviewed and are negative.    Physical Exam Triage Vital Signs ED Triage Vitals [03/22/18 1052]  Enc Vitals Group  BP (!) 147/88     Pulse Rate 87     Resp 18     Temp 97.9 F (36.6 C)     Temp Source Oral     SpO2 100 %     Weight      Height      Head Circumference      Peak Flow      Pain Score      Pain Loc      Pain Edu?      Excl. in [place]?    No data found.  Updated Vital Signs BP (!) 147/88 (BP Location: Left Arm)   Pulse 87   Temp 97.9 F (36.6 C) (Oral)   Resp 18   SpO2 100%   Visual Acuity Right Eye Distance:   Left Eye Distance:   Bilateral Distance:    Right Eye Near:   Left Eye Near:    Bilateral Near:     Physical Exam  Constitutional: She is oriented to person, place, and time. She appears well-developed and well-nourished.  Cardiovascular: Normal rate and regular rhythm.  Pulmonary/Chest: Effort normal and breath sounds normal.  Musculoskeletal:       Right wrist: She exhibits tenderness.       Right hand: She exhibits tenderness. She exhibits normal  capillary refill, no deformity and no swelling. Normal sensation noted. Normal strength noted.  Very short nails noted but intact. Extremely tender to palpation. No edema, erythema or drainage noted.   Neurological: She is alert and oriented to person, place, and time. No cranial nerve deficit.  Skin: Skin is warm and dry.  Psychiatric: She has a normal mood and affect. Her behavior is normal.     UC Treatments / Results  Labs (all labs ordered are listed, but only abnormal results are displayed) Labs Reviewed - No data to display  EKG None  Radiology No results found.  Procedures Procedures (including critical care time)  Medications Ordered in UC Medications  dexamethasone (DECADRON) injection 10 mg (has no administration in time range)  ketorolac (TORADOL) injection 60 mg (has no administration in time range)    Initial Impression / Assessment and Plan / UC Course  I have reviewed the triage vital signs and the nursing notes.  Pertinent labs & imaging results that were available during my care of the patient were reviewed by me and considered in my medical decision making (see chart for details).    42 year old female presenting with right hand pain that radiates up into the wrist and forearm for the past several months.  The pain has been progressively worsening since its onset.  No specific injury or trauma noted.  Physical exam shows tenderness throughout the areas of complaint but no deformity, erythema or edema noted.  Neurovascularly intact. Toradol 60 mg IM and Decadron 10 mg IM given in clinic.  Supportive care advised with orthopedic follow-up.   Plan:  - Rest, ice, compression, and elevation (RICE) therapy. - Volar wrist splint to be worn for comfort  - NSAIDs and steroids per medication orders. - Orthopedics referral.  Today's evaluation has revealed no signs of a dangerous process. Discussed diagnosis with patient. Patient aware of their diagnosis, possible  red flag symptoms to watch out for and need for close follow up. Patient understands verbal and written discharge instructions. Patient comfortable with plan and disposition.  Patient has a clear mental status at this time, good insight into illness (after discussion and teaching) and has clear  judgment to make decisions regarding their care.  Documentation was completed with the aid of voice recognition software. Transcription may contain typographical errors. Final Clinical Impressions(s) / UC Diagnoses   Final diagnoses:  Hand pain, right     Discharge Instructions     Wear splint for comfort. May continue ice if you find it helpful. Take medications as prescribed. Follow-up with orthopedics. Call as soon as possible to schedule an appointment.     ED Prescriptions    Medication Sig Dispense Auth. Provider   diclofenac (VOLTAREN) 50 MG EC tablet Take 1 tablet (50 mg total) by mouth 3 (three) times daily as needed. 30 tablet Enrique Sack, FNP   methylPREDNISolone (MEDROL DOSEPAK) 4 MG TBPK tablet Take as directed 21 tablet Enrique Sack, FNP     Controlled Substance Prescriptions [place] Controlled Substance Registry consulted? Not Applicable   Enrique Sack, [place] 03/22/18 1133

## 2018-03-22 NOTE — ED Triage Notes (Signed)
Pt sts right hand pain and finger nails have fallen off; pt sts increased pain and inability to move fingers

## 2018-04-04 ENCOUNTER — Other Ambulatory Visit: Payer: Self-pay | Admitting: Obstetrics

## 2018-04-04 DIAGNOSIS — Z1231 Encounter for screening mammogram for malignant neoplasm of breast: Secondary | ICD-10-CM

## 2018-05-01 ENCOUNTER — Ambulatory Visit: Payer: Medicaid Other

## 2018-05-21 ENCOUNTER — Ambulatory Visit (HOSPITAL_COMMUNITY)
Admission: EM | Admit: 2018-05-21 | Discharge: 2018-05-21 | Disposition: A | Discharge status: Home / Self Care | Payer: Medicaid Other | Attending: Family Medicine | Admitting: Family Medicine

## 2018-05-21 DIAGNOSIS — L853 Xerosis cutis: Secondary | ICD-10-CM

## 2018-05-21 DIAGNOSIS — Z131 Encounter for screening for diabetes mellitus: Secondary | ICD-10-CM | POA: Diagnosis not present

## 2018-05-21 LAB — GLUCOSE, CAPILLARY: Glucose-Capillary: 95 mg/dL (ref 70–99)

## 2018-05-21 MED ORDER — PREDNISONE 50 MG PO TABS: 50.0000 mg | ORAL_TABLET | Freq: Every day | ORAL | 0 refills | Status: DC

## 2018-05-21 MED ORDER — METHYLPREDNISOLONE SODIUM SUCC 125 MG IJ SOLR
INTRAMUSCULAR | Status: AC
  Filled 2018-05-21: qty 2

## 2018-05-21 MED ORDER — TRIAMCINOLONE ACETONIDE 0.025 % EX OINT
1.0000 | TOPICAL_OINTMENT | Freq: Two times a day (BID) | CUTANEOUS | 0 refills | Status: DC
Start: 2018-05-21 — End: 2021-04-13

## 2018-05-21 MED ORDER — KETOROLAC TROMETHAMINE 30 MG/ML IJ SOLN
30.0000 mg | Freq: Once | INTRAMUSCULAR | Status: AC
  Administered 2018-05-21: 30 mg via INTRAMUSCULAR

## 2018-05-21 MED ORDER — METHYLPREDNISOLONE SODIUM SUCC 125 MG IJ SOLR
80.0000 mg | Freq: Once | INTRAMUSCULAR | Status: AC
  Administered 2018-05-21: 80 mg via INTRAMUSCULAR

## 2018-05-21 MED ORDER — KETOROLAC TROMETHAMINE 30 MG/ML IJ SOLN
INTRAMUSCULAR | Status: AC
Start: 2018-05-21 — End: ?
  Filled 2018-05-21: qty 1

## 2018-05-21 MED ORDER — UREA 10 % EX CREA: TOPICAL_CREAM | CUTANEOUS | 0 refills | Status: DC | PRN

## 2018-05-21 NOTE — ED Provider Notes (Signed)
[facility]    CSN: [id number] Arrival date & time: 05/21/18  1657     History   Chief Complaint Chief Complaint  Patient presents with  . Skin Issues  . Arthritis    HPI [name] is a 42 y.o. female.   42 year old female comes in for evaluation of dry skin and cracking skin. States this has been going on for awhile, and was suggested to see a rheumatoid specialist, but has not gone yet. States work requires repetitive motion of hand and has to stand long hours causing symptoms to worsen. States these symptoms has prevented her from working well. Denies new contact/exposures. Has been trying to put on hydrocortisone, lotions without relief. Patient would also like DM testing as it runs in the family.      Past Medical History:  Diagnosis Date  . History of cervical dysplasia    20 yrs ago     There are no active problems to display for this patient.   Past Surgical History:  Procedure Laterality Date  . APPENDECTOMY    . CESAREAN SECTION      OB History    Gravida  4   Para  3   Term  3   Preterm      AB  1   Living  3     SAB      TAB      Ectopic      Multiple      Live Births  4            Home Medications    Prior to Admission medications   Medication Sig Start Date End Date Taking? Authorizing Provider  medroxyPROGESTERone (DEPO-PROVERA) 150 MG/ML injection Inject 1 mL (150 mg total) into the muscle every 3 (three) months. 08/17/17   Shelly Bombard, MD  methocarbamol (ROBAXIN) 500 MG tablet Take 1 tablet (500 mg total) by mouth 2 (two) times daily. 10/21/17   Volanda Napoleon, PA-C  predniSONE (DELTASONE) 50 MG tablet Take 1 tablet (50 mg total) by mouth daily. 05/21/18   Tasia Catchings, Amy V, PA-C  triamcinolone (KENALOG) 0.025 % ointment Apply 1 application topically 2 (two) times daily. 05/21/18   Tasia Catchings, Amy V, PA-C  urea (CARMOL) 10 % cream Apply topically as needed. 05/21/18   Ok Edwards, PA-C    Family History Family  History  Problem Relation Age of Onset  . Hypertension Mother   . Breast cancer Mother   . Diabetes Father   . Hypertension Father   . Breast cancer Paternal Grandmother     Social History Social History   Tobacco Use  . Smoking status: Current Some Day Smoker    Types: Cigarettes  . Smokeless tobacco: Never Used  Substance Use Topics  . Alcohol use: Yes    Comment: occ.  . Drug use: No     Allergies   Patient has no known allergies.   Review of Systems Review of Systems  Reason unable to perform ROS: See HPI as above.     Physical Exam Triage Vital Signs ED Triage Vitals  Enc Vitals Group     BP 05/21/18 1734 140/84     Pulse Rate 05/21/18 1734 72     Resp 05/21/18 1734 20     Temp 05/21/18 1734 97.8 F (36.6 C)     Temp Source 05/21/18 1734 Oral     SpO2 05/21/18 1734 100 %  Weight --      Height --      Head Circumference --      Peak Flow --      Pain Score 05/21/18 1733 10     Pain Loc --      Pain Edu? --      Excl. in [place]? --    No data found.  Updated Vital Signs BP 140/84 (BP Location: Left Arm)   Pulse 72   Temp 97.8 F (36.6 C) (Oral)   Resp 20   SpO2 100%   Physical Exam  Constitutional: She is oriented to person, place, and time. She appears well-developed and well-nourished. No distress.  HENT:  Head: Normocephalic and atraumatic.  Eyes: Pupils are equal, round, and reactive to light. Conjunctivae are normal.  Neurological: She is alert and oriented to person, place, and time.  Skin: She is not diaphoretic.  See picture below.  Fingers with short finger nails/nail deformity (possible nail trauma from long years of wearing acrylic nails in the past per patient). Over all dry skin with eczematous plaques.   Dry/cracked heels with fissures. No erythema, warmth. Patient tender to palpation/light touch.            UC Treatments / Results  Labs (all labs ordered are listed, but only abnormal results are displayed) Labs  Reviewed  GLUCOSE, CAPILLARY    EKG None  Radiology No results found.  Procedures Procedures (including critical care time)  Medications Ordered in UC Medications  ketorolac (TORADOL) 30 MG/ML injection 30 mg (has no administration in time range)  methylPREDNISolone sodium succinate (SOLU-MEDROL) 125 mg/2 mL injection 80 mg (has no administration in time range)    Initial Impression / Assessment and Plan / UC Course  I have reviewed the triage vital signs and the nursing notes.  Pertinent labs & imaging results that were available during my care of the patient were reviewed by me and considered in my medical decision making (see chart for details).    Discussed case with Dr Meda Coffee. Will start urea cream for cracked heel. ?eczematous component to fingers/hand, will have patient take prednisone and use triamcinolone ointment. Patient had good improvement after solumedrol and toradol injection during last office visit, so will provide this visit as well.   Discussed unable to do diabetes workup, but give CBG 95, lower suspicion for diabetes. Will have patient follow up with PCP for further evaluation and management needed.  Final Clinical Impressions(s) / UC Diagnoses   Final diagnoses:  Dry skin dermatitis    ED Prescriptions    Medication Sig Dispense Auth. Provider   triamcinolone (KENALOG) 0.025 % ointment Apply 1 application topically 2 (two) times daily. 30 g Yu, Amy V, PA-C   urea (CARMOL) 10 % cream Apply topically as needed. 71 g Yu, Amy V, PA-C   predniSONE (DELTASONE) 50 MG tablet Take 1 tablet (50 mg total) by mouth daily. 5 tablet Tobin Chad, [place] 05/21/18 [phone]

## 2018-05-21 NOTE — ED Triage Notes (Signed)
Pt presents with skin abrasions and cracking skin; Pt was diagnosed with arthritis and is awaiting her appointment with the rheumatoid specialist in December.  Pt believes that she may have diabetes and would like blood work done.

## 2018-05-21 NOTE — Discharge Instructions (Addendum)
Start urea cream for your heel that can help soften the thick skin, and help area heel. Avoid using soap to the area for now. Discontinue neosporin to the area. Start prednisone tablets and triamcinolone ointment to the hands. You can apply ointment at night time, and wear gloves to allow ointment to soak into your hands.  As discussed, we do not test for diabetes in this office. Usually, you will need HbA1c to test and diagnose diabetes. However, your blood sugar was 95 today, which is normal. This makes you having diabetes a lot less likely.  Given your current on going symptoms, I suggest you start out with primary care so they can do a preliminary workup for your symptoms. After getting those information, they will be able to point you to the right specialist if you require additional testing.

## 2018-12-20 ENCOUNTER — Emergency Department (HOSPITAL_COMMUNITY): Payer: Medicaid Other

## 2018-12-20 ENCOUNTER — Other Ambulatory Visit: Payer: Self-pay

## 2018-12-20 ENCOUNTER — Emergency Department (HOSPITAL_COMMUNITY)
Admission: EM | Admit: 2018-12-20 | Discharge: 2018-12-20 | Disposition: A | Discharge status: Home / Self Care | Payer: Medicaid Other | Attending: Emergency Medicine | Admitting: Emergency Medicine

## 2018-12-20 ENCOUNTER — Encounter (HOSPITAL_COMMUNITY): Payer: Self-pay | Admitting: Radiology

## 2018-12-20 DIAGNOSIS — K852 Alcohol induced acute pancreatitis without necrosis or infection: Secondary | ICD-10-CM

## 2018-12-20 DIAGNOSIS — R1011 Right upper quadrant pain: Secondary | ICD-10-CM

## 2018-12-20 DIAGNOSIS — Z79899 Other long term (current) drug therapy: Secondary | ICD-10-CM | POA: Insufficient documentation

## 2018-12-20 DIAGNOSIS — R339 Retention of urine, unspecified: Secondary | ICD-10-CM | POA: Insufficient documentation

## 2018-12-20 DIAGNOSIS — F17210 Nicotine dependence, cigarettes, uncomplicated: Secondary | ICD-10-CM | POA: Diagnosis not present

## 2018-12-20 DIAGNOSIS — E876 Hypokalemia: Secondary | ICD-10-CM

## 2018-12-20 DIAGNOSIS — R52 Pain, unspecified: Secondary | ICD-10-CM

## 2018-12-20 DIAGNOSIS — F419 Anxiety disorder, unspecified: Secondary | ICD-10-CM | POA: Insufficient documentation

## 2018-12-20 LAB — URINALYSIS, ROUTINE W REFLEX MICROSCOPIC
Bilirubin Urine: NEGATIVE
Glucose, UA: NEGATIVE mg/dL
Hgb urine dipstick: NEGATIVE
Ketones, ur: 80 mg/dL — AB
Leukocytes,Ua: NEGATIVE
Nitrite: NEGATIVE
Protein, ur: NEGATIVE mg/dL
Specific Gravity, Urine: >1.046 — ABNORMAL HIGH (ref 1.005–1.030)
pH: 6 (ref 5.0–8.0)

## 2018-12-20 LAB — CBC WITH DIFFERENTIAL/PLATELET
Abs Immature Granulocytes: 0.02 10*3/uL (ref 0.00–0.07)
Basophils Absolute: 0 10*3/uL (ref 0.0–0.1)
Basophils Relative: 0 %
Eosinophils Absolute: 0 10*3/uL (ref 0.0–0.5)
Eosinophils Relative: 0 %
HCT: 37.5 % (ref 36.0–46.0)
Hemoglobin: 12.2 g/dL (ref 12.0–15.0)
Immature Granulocytes: 0 %
Lymphocytes Relative: 5 %
Lymphs Abs: 0.5 10*3/uL — ABNORMAL LOW (ref 0.7–4.0)
MCH: 30.7 pg (ref 26.0–34.0)
MCHC: 32.5 g/dL (ref 30.0–36.0)
MCV: 94.2 fL (ref 80.0–100.0)
Monocytes Absolute: 0.6 10*3/uL (ref 0.1–1.0)
Monocytes Relative: 7 %
Neutro Abs: 8 10*3/uL — ABNORMAL HIGH (ref 1.7–7.7)
Neutrophils Relative %: 88 %
Platelets: 241 10*3/uL (ref 150–400)
RBC: 3.98 MIL/uL (ref 3.87–5.11)
RDW: 13.8 % (ref 11.5–15.5)
WBC: 9.1 10*3/uL (ref 4.0–10.5)
nRBC: 0 % (ref 0.0–0.2)

## 2018-12-20 LAB — COMPREHENSIVE METABOLIC PANEL
ALT: 31 U/L (ref 0–44)
AST: 59 U/L — ABNORMAL HIGH (ref 15–41)
Albumin: 4.6 g/dL (ref 3.5–5.0)
Alkaline Phosphatase: 71 U/L (ref 38–126)
Anion gap: 18 — ABNORMAL HIGH (ref 5–15)
BUN: 14 mg/dL (ref 6–20)
CO2: 22 mmol/L (ref 22–32)
Calcium: 9.6 mg/dL (ref 8.9–10.3)
Chloride: 96 mmol/L — ABNORMAL LOW (ref 98–111)
Creatinine, Ser: 0.81 mg/dL (ref 0.44–1.00)
GFR calc Af Amer: >60 mL/min (ref >60)
GFR calc non Af Amer: >60 mL/min (ref >60)
Glucose, Bld: 148 mg/dL — ABNORMAL HIGH (ref 70–99)
Potassium: 2.9 mmol/L — ABNORMAL LOW (ref 3.5–5.1)
Sodium: 136 mmol/L (ref 135–145)
Total Bilirubin: 2 mg/dL — ABNORMAL HIGH (ref 0.3–1.2)
Total Protein: 8.4 g/dL — ABNORMAL HIGH (ref 6.5–8.1)

## 2018-12-20 LAB — I-STAT BETA HCG BLOOD, ED (MC, WL, AP ONLY): I-stat hCG, quantitative: <5 m[IU]/mL (ref <5)

## 2018-12-20 LAB — LIPASE, BLOOD: Lipase: 306 U/L — ABNORMAL HIGH (ref 11–51)

## 2018-12-20 IMAGING — US ULTRASOUND ABDOMEN LIMITED
1 series · 14 of 25 positions shown · non-contrast
Comparison: CT scan, same date.

CLINICAL DATA: Right upper quadrant abdominal pain for 5 days.

EXAM:
ULTRASOUND ABDOMEN LIMITED RIGHT UPPER QUADRANT

[Series 1: ultrasound abdomen limited · 14 of 47 slices shown]
[im 1/47]
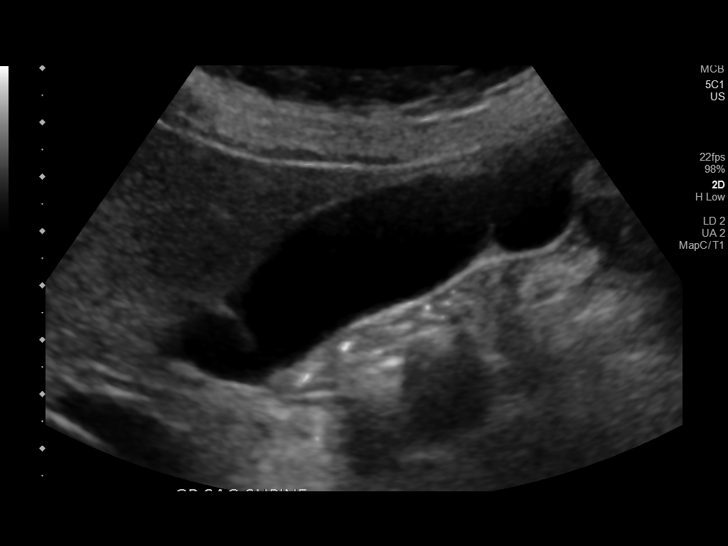
[im 4/47]
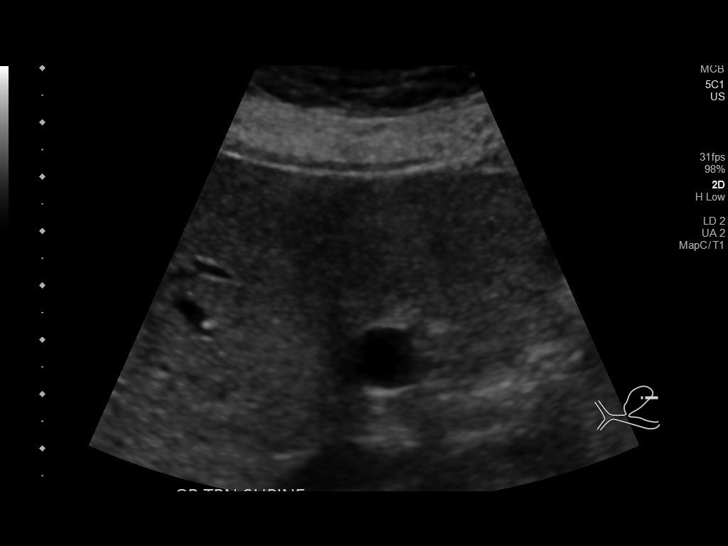
[im 8/47]
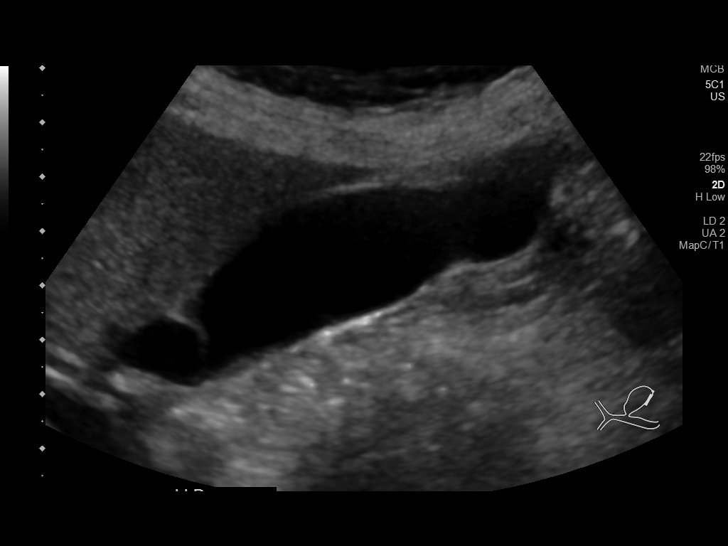
[im 12/47]
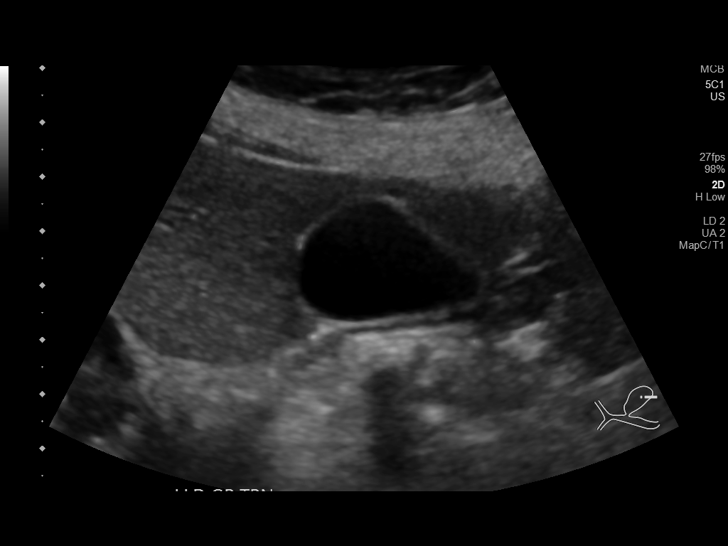
[im 16/47]
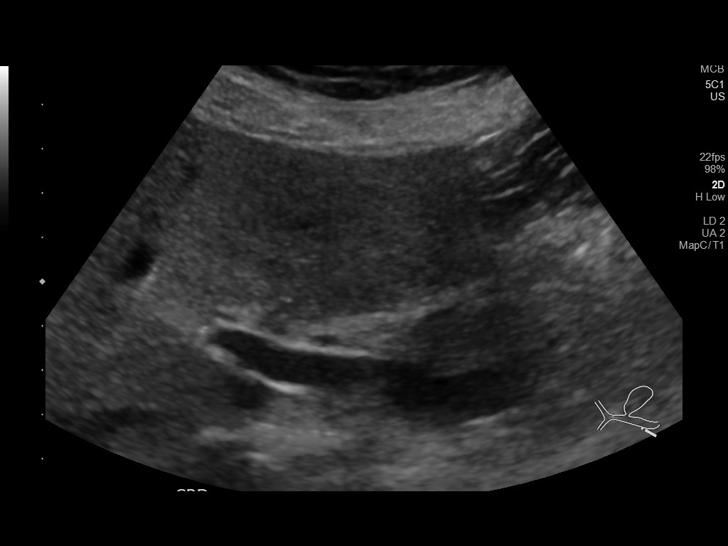
[im 18/47]
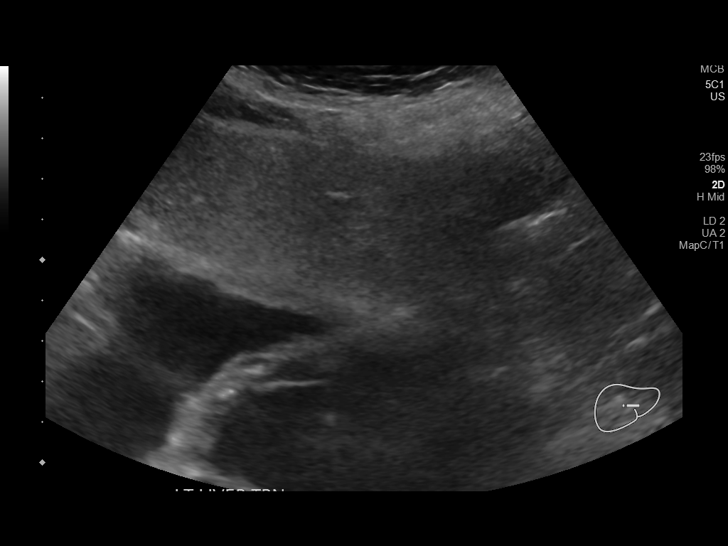
[im 22/47]
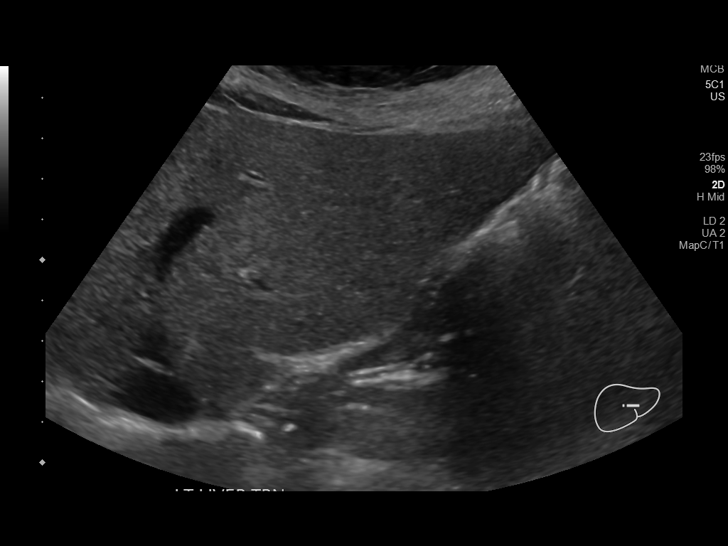
[im 25/47]
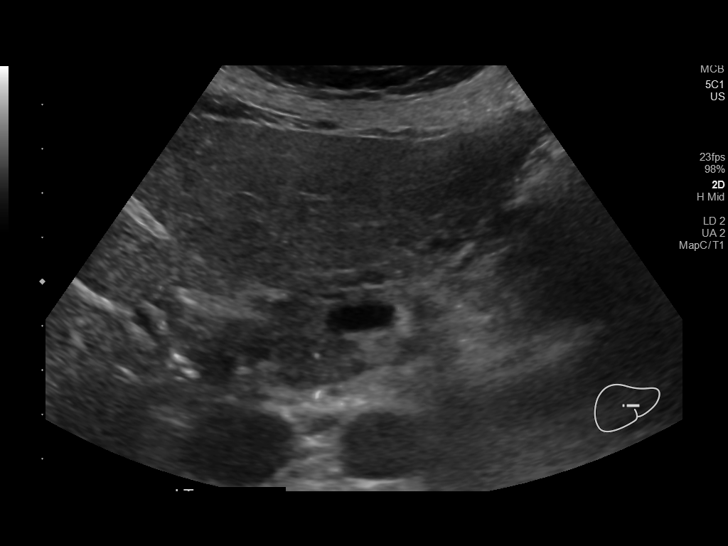
[im 29/47]
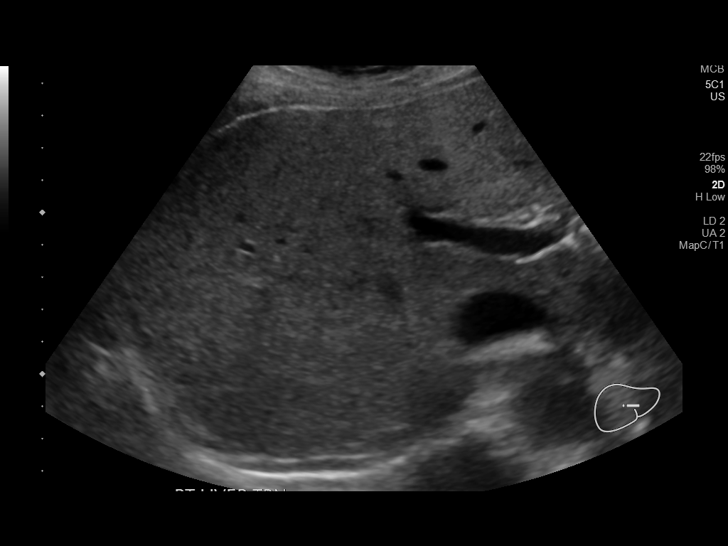
[im 31/47]
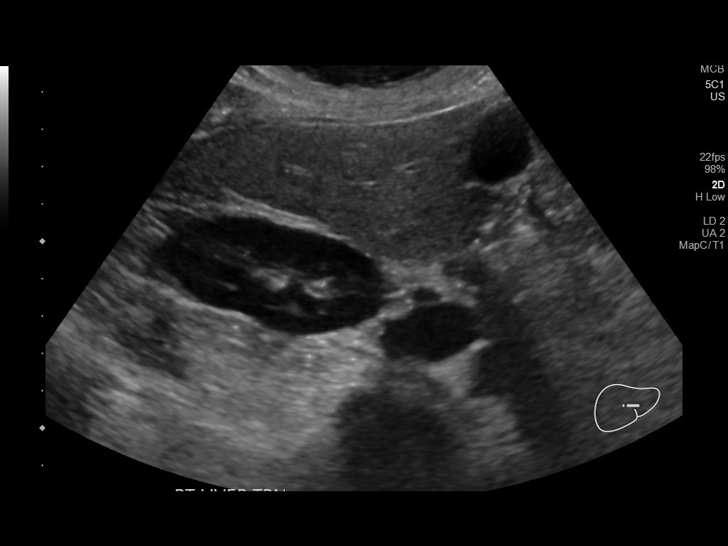
[im 35/47]
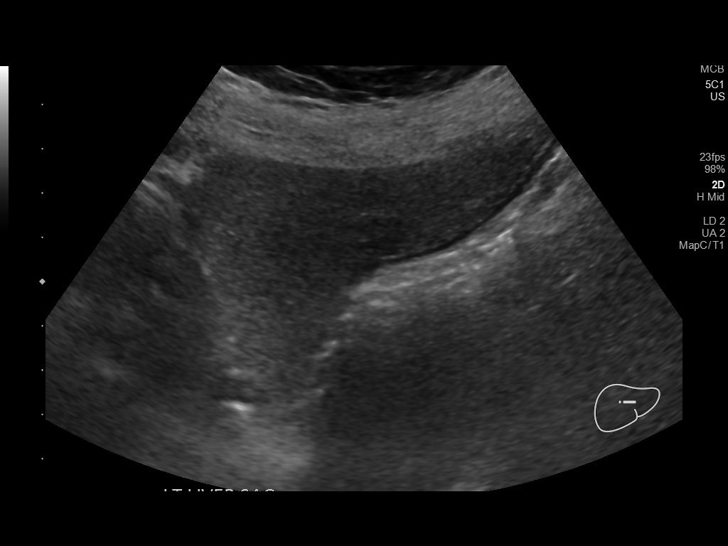
[im 39/47]
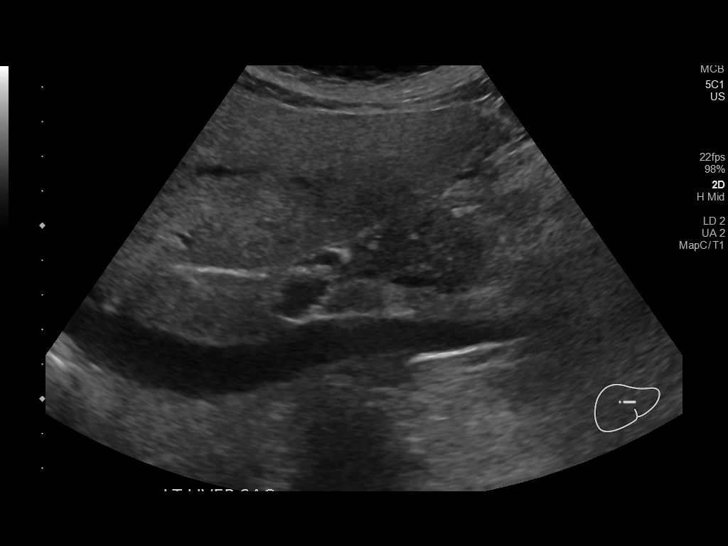
[im 43/47]
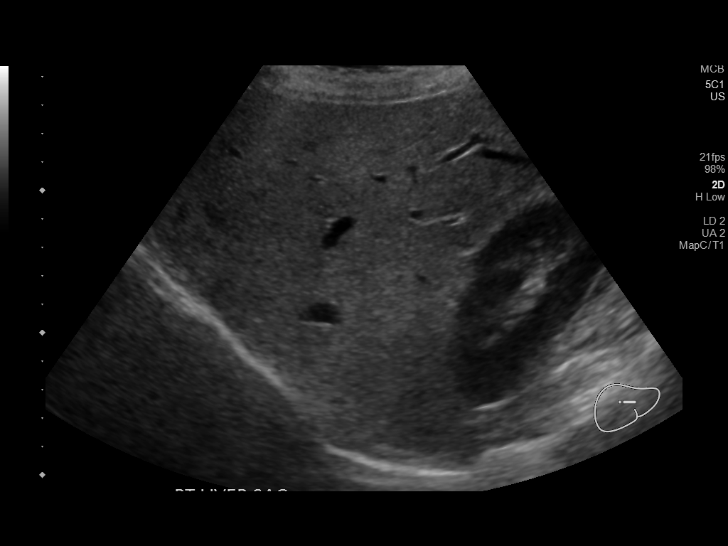
[im 47/47]
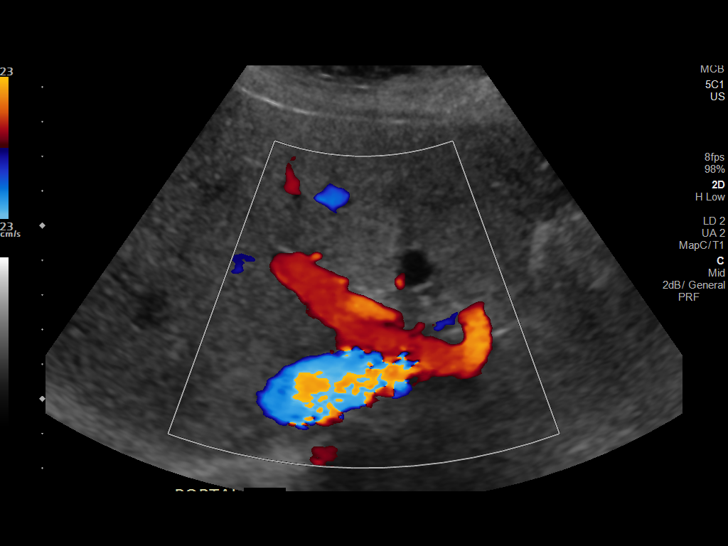

[14 of 25 positions shown; findings below may reference images not displayed]

FINDINGS: Gallbladder:

No gallstones or wall thickening visualized. No sonographic Murphy
sign noted by sonographer.

Common bile duct:

Diameter: 3.3 mm

Liver:

There is diffuse increased echogenicity of the liver and decreased
through transmission consistent with fatty infiltration. No focal
lesions or biliary dilatation. Portal vein is patent on color
Doppler imaging with normal direction of blood flow towards the
liver.
IMPRESSION: 1. Normal gallbladder.  No findings for acute cholecystitis.
2. No intra or extrahepatic biliary dilatation.
3. Diffuse fatty infiltration of the liver.

## 2018-12-20 IMAGING — CT CT ABDOMEN AND PELVIS WITH CONTRAST
2 of 5 series · 16 of 46 positions shown, 18 images · IV contrast (ISOVUE)
Comparison: None.

CLINICAL DATA: Bowel obstruction.  Low-grade.

EXAM:
CT ABDOMEN AND PELVIS WITH CONTRAST
TECHNIQUE: Multidetector CT imaging of the abdomen and pelvis was performed
using the standard protocol following bolus administration of
intravenous contrast.
CONTRAST:  100mL OMNIPAQUE IOHEXOL 300 MG/ML  SOLN

[Series 2: axial st · axial · 0.68mm/px · z∈[-499,-139]mm · 13 of 85 slices shown, 15 images]
[im 7/85  soft-tissue]
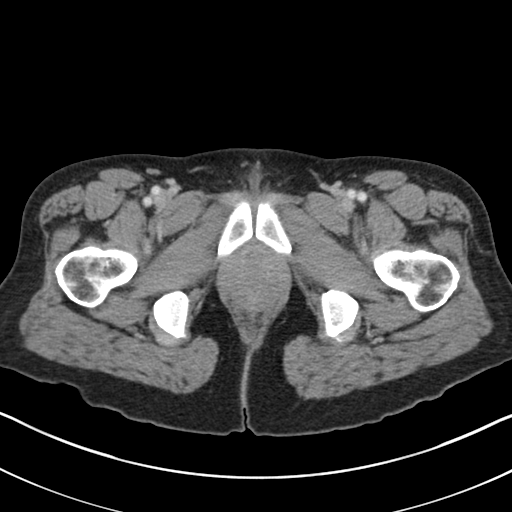
[im 7/85  bone]
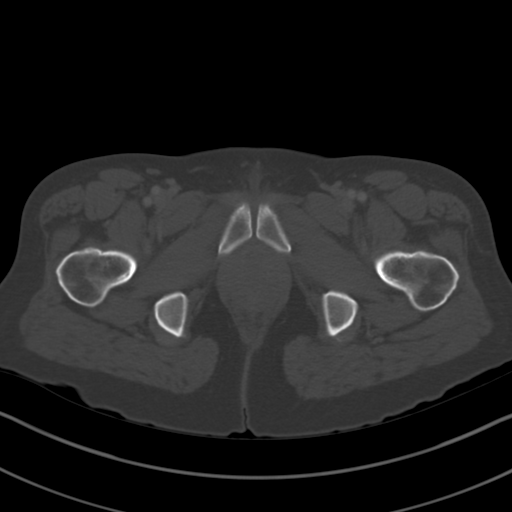
[im 13/85  soft-tissue]
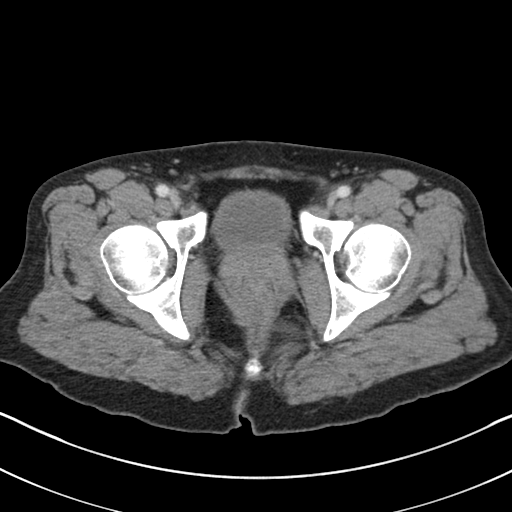
[im 19/85  soft-tissue]
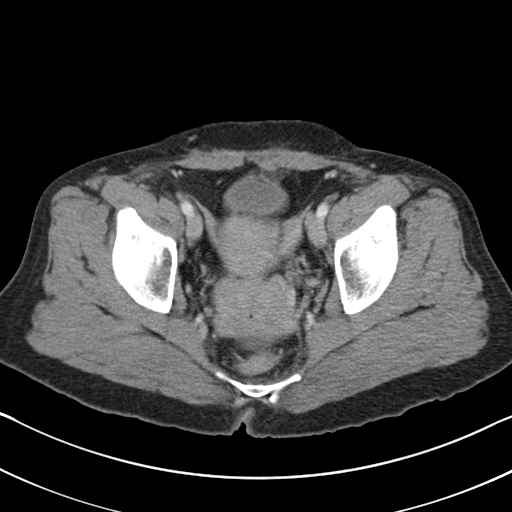
[im 25/85  soft-tissue]
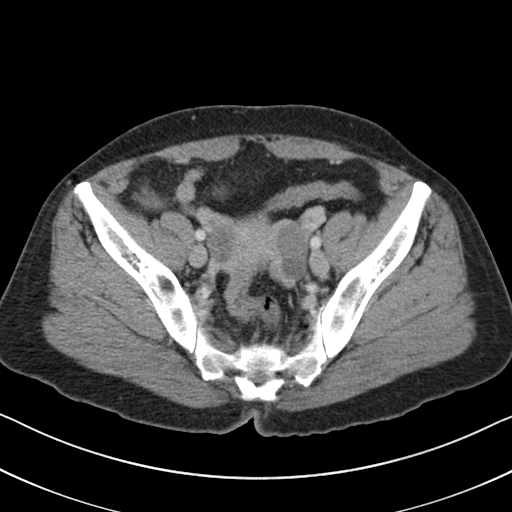
[im 31/85  soft-tissue]
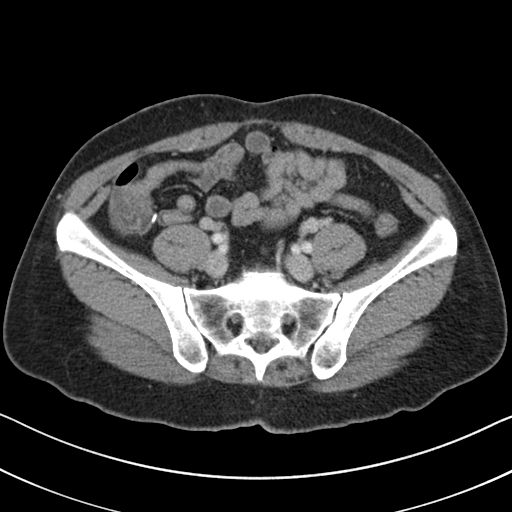
[im 37/85  soft-tissue]
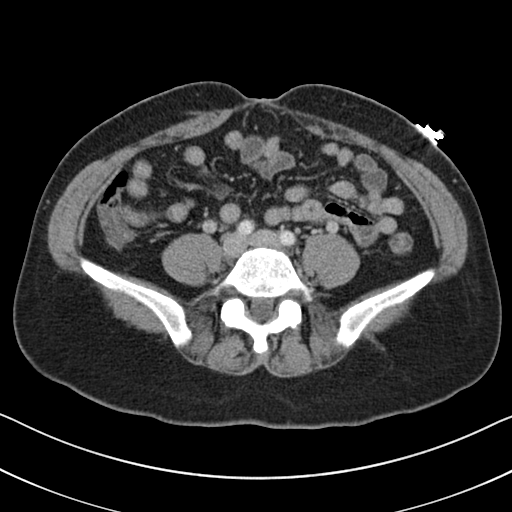
[im 43/85  soft-tissue]
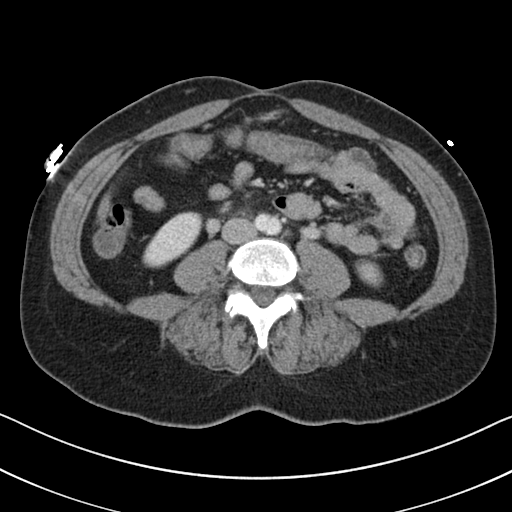
[im 49/85  soft-tissue]
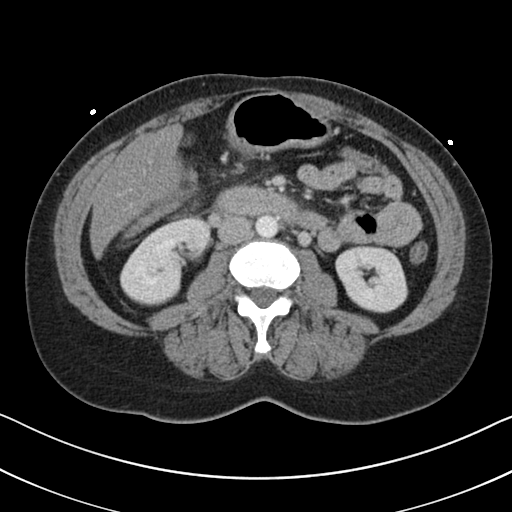
[im 55/85  soft-tissue]
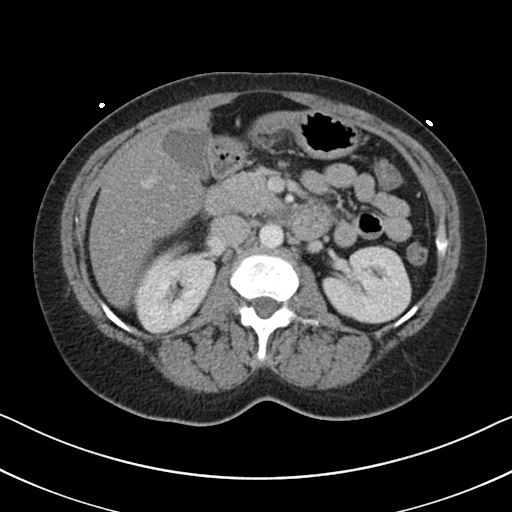
[im 55/85  bone]
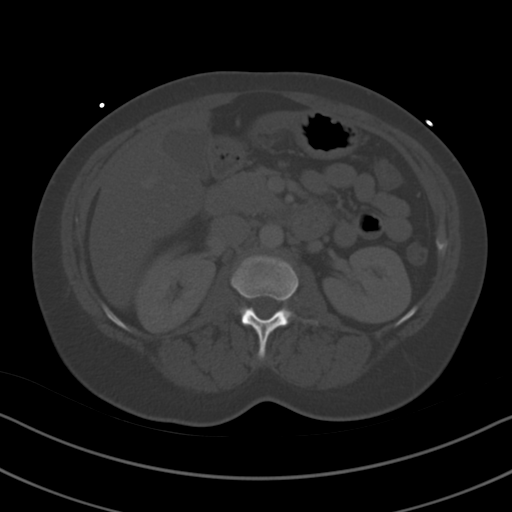
[im 61/85  soft-tissue]
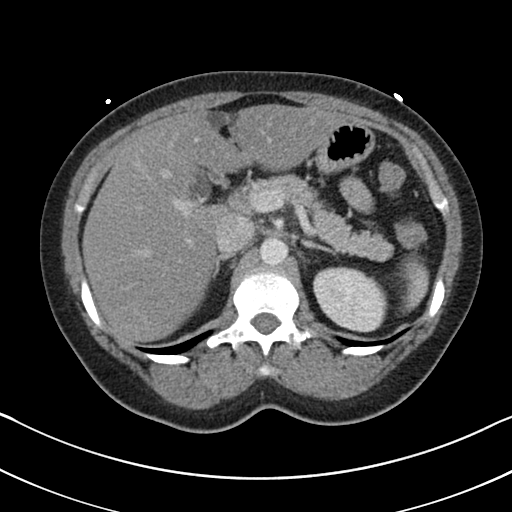
[im 67/85  soft-tissue]
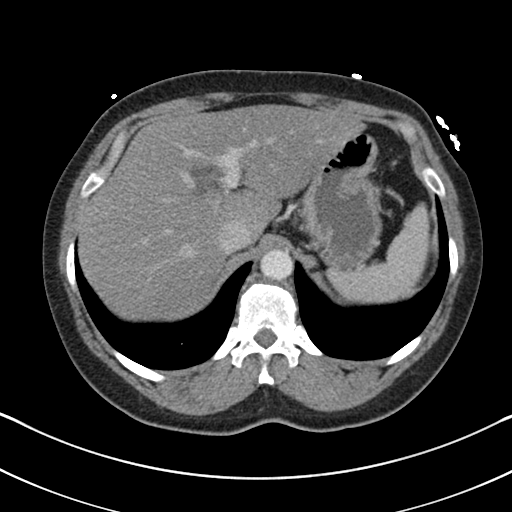
[im 73/85  soft-tissue]
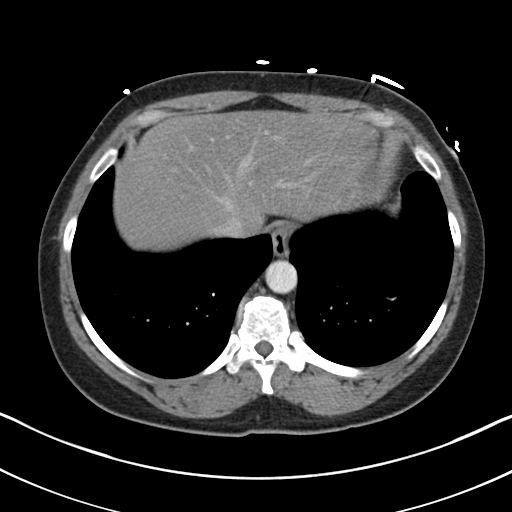
[im 79/85  soft-tissue]
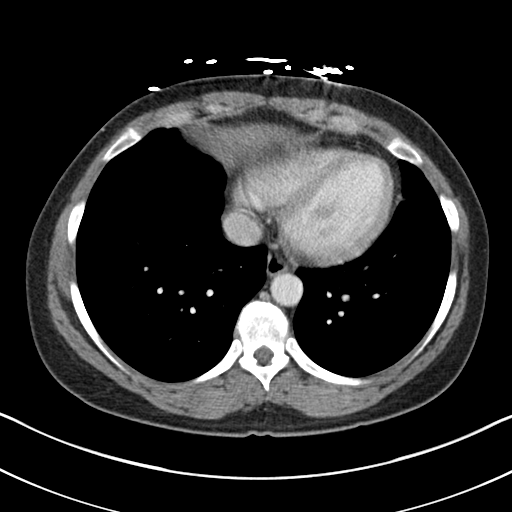

[Series 5: coronal st · coronal · 0.66mm/px · 3 of 127 slices shown]
[im 43/127  soft-tissue]
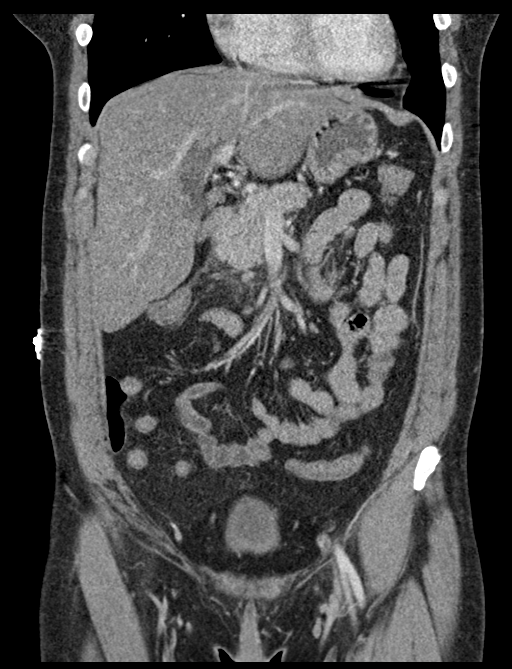
[im 57/127  soft-tissue]
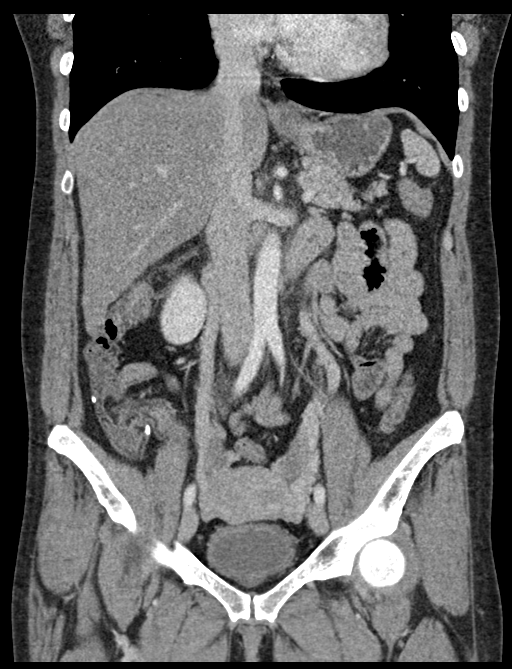
[im 71/127  soft-tissue]
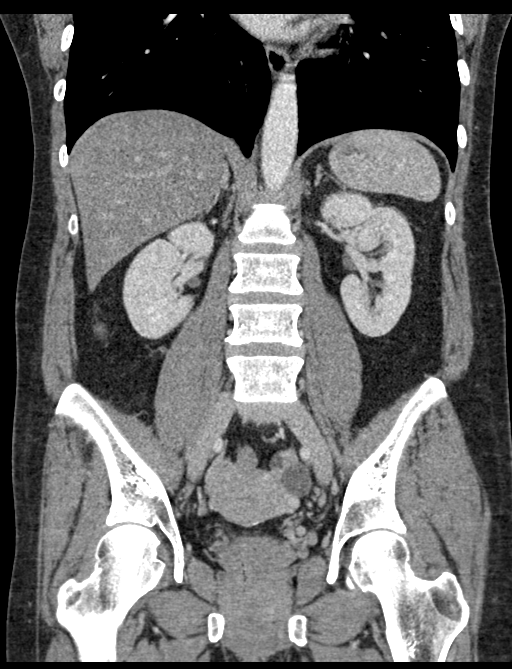

[16 of 46 positions shown; findings below may reference images not displayed]

FINDINGS: Lower chest: Lung bases are clear.

Hepatobiliary: Focal low attenuation along the falciform ligament
consistent with focal fatty infiltration. No biliary duct
dilatation. Gallbladder normal.

Pancreas: Small amount of fluid positioned between the second
portion duodenum and the RIGHT hepatic lobe. This extends along the
anterior RIGHT pararenal fascia (image 34/2). There is mild haziness
in the fat adjacent to the pancreatic head. No pancreatic duct
dilatation. Body and tail the pancreas appear normal.

Spleen: Normal spleen

Adrenals/urinary tract: Stomach, small bowel normal. Surgical margin
along the cecum presumably relates to appendectomy. The terminal
ileum appears normal. The colon and rectosigmoid colon normal.

No evidence of bowel obstruction. No evidence of bowel inflammation
of than mild inflammation along the second portion duodenum as
described in the pancreatic section.

Stomach/Bowel: Stomach, small bowel, appendix, and cecum are normal.
The colon and rectosigmoid colon are normal.

Vascular/Lymphatic: Abdominal aorta is normal caliber. No periportal
or retroperitoneal adenopathy. No pelvic adenopathy.

Reproductive: Uterus and ovaries normal.

Other: No free fluid.

Musculoskeletal: No aggressive osseous lesion.
IMPRESSION: 1. Mild inflammation along the second portion duodenum and head of
the pancreas with small amount of fluid along the RIGHT anterior
pararenal fascia. Differential includes mild acute pancreatitis
versus duodenitis. The pancreas appears normal otherwise.
2. No gallstones evident on CT exam. Common bile duct normal
caliber.
3. No bowel obstruction.
4. Prior appendectomy.

## 2018-12-20 MED ORDER — ONDANSETRON HCL 4 MG/2ML IJ SOLN
4.0000 mg | Freq: Once | INTRAMUSCULAR | Status: AC
  Administered 2018-12-20: 4 mg via INTRAVENOUS
  Filled 2018-12-20: qty 2

## 2018-12-20 MED ORDER — ONDANSETRON 4 MG PO TBDP: 4.0000 mg | ORAL_TABLET | Freq: Three times a day (TID) | ORAL | 0 refills | Status: DC | PRN

## 2018-12-20 MED ORDER — SODIUM CHLORIDE (PF) 0.9 % IJ SOLN
INTRAMUSCULAR | Status: AC
  Filled 2018-12-20: qty 50

## 2018-12-20 MED ORDER — POTASSIUM CHLORIDE CRYS ER 20 MEQ PO TBCR
40.0000 meq | EXTENDED_RELEASE_TABLET | Freq: Once | ORAL | Status: AC
  Administered 2018-12-20: 16:00:00 40 meq via ORAL
  Filled 2018-12-20: qty 2

## 2018-12-20 MED ORDER — MORPHINE SULFATE (PF) 2 MG/ML IV SOLN
2.0000 mg | Freq: Once | INTRAVENOUS | Status: AC
  Administered 2018-12-20: 11:00:00 2 mg via INTRAVENOUS
  Filled 2018-12-20: qty 1

## 2018-12-20 MED ORDER — MORPHINE SULFATE (PF) 2 MG/ML IV SOLN
2.0000 mg | Freq: Once | INTRAVENOUS | Status: AC
  Administered 2018-12-20: 12:00:00 2 mg via INTRAVENOUS
  Filled 2018-12-20: qty 1

## 2018-12-20 MED ORDER — POTASSIUM CHLORIDE ER 10 MEQ PO TBCR: 20.0000 meq | EXTENDED_RELEASE_TABLET | Freq: Every day | ORAL | 0 refills | Status: DC

## 2018-12-20 MED ORDER — LORAZEPAM 2 MG/ML IJ SOLN
0.5000 mg | Freq: Once | INTRAMUSCULAR | Status: AC
  Administered 2018-12-20: 11:00:00 0.5 mg via INTRAVENOUS
  Filled 2018-12-20: qty 1

## 2018-12-20 MED ORDER — POTASSIUM CHLORIDE 10 MEQ/100ML IV SOLN
10.0000 meq | Freq: Once | INTRAVENOUS | Status: AC
  Administered 2018-12-20: 10 meq via INTRAVENOUS
  Filled 2018-12-20: qty 100

## 2018-12-20 MED ORDER — LORAZEPAM 2 MG/ML IJ SOLN
0.5000 mg | Freq: Once | INTRAMUSCULAR | Status: AC
  Administered 2018-12-20: 0.5 mg via INTRAVENOUS
  Filled 2018-12-20: qty 1

## 2018-12-20 MED ORDER — SODIUM CHLORIDE 0.9 % IV BOLUS
1000.0000 mL | Freq: Once | INTRAVENOUS | Status: AC
  Administered 2018-12-20: 14:00:00 1000 mL via INTRAVENOUS

## 2018-12-20 MED ORDER — SODIUM CHLORIDE 0.9 % IV BOLUS
1000.0000 mL | Freq: Once | INTRAVENOUS | Status: AC
  Administered 2018-12-20: 12:00:00 1000 mL via INTRAVENOUS

## 2018-12-20 MED ORDER — HYDROCODONE-ACETAMINOPHEN 5-325 MG PO TABS: 2.0000 | ORAL_TABLET | ORAL | 0 refills | Status: DC | PRN

## 2018-12-20 MED ORDER — IOHEXOL 300 MG/ML  SOLN
100.0000 mL | Freq: Once | INTRAMUSCULAR | Status: AC | PRN
  Administered 2018-12-20: 12:00:00 100 mL via INTRAVENOUS

## 2018-12-20 NOTE — ED Notes (Signed)
NT did bladder scan, scan showed 0 mL.

## 2018-12-20 NOTE — ED Notes (Signed)
Bed: FG18 Expected date:  Expected time:  Means of arrival:  Comments: EMS- 42yo, abdominal pain x 24hrs, constipation

## 2018-12-20 NOTE — ED Notes (Signed)
ED Provider at bedside. 

## 2018-12-20 NOTE — ED Notes (Signed)
Pt reporting abdominal pain on right side, mid lower back.  Also c/o anxiety, asking for anxiety medications.   Also c/o "nail infection" that she was supposed to see a specialist about why she was missing a nail.

## 2018-12-20 NOTE — ED Provider Notes (Signed)
[facility] DEPT Provider Note   CSN: [id number] Arrival date & time: 12/20/18  1015    History   Chief Complaint Chief Complaint  Patient presents with   Abdominal Pain    HPI [name] is a 43 y.o. female with history of appendectomy and cesarean section presents today for abdominal pain, constipation, inability urinate and back pain.  Patient reports that 5 days ago she began having abdominal pain and constipation.  Patient reports that her last bowel movement 5 days ago was very thin with blood streaking.  Patient reports that she has been unable to have a bowel movement since that time she has been using enemas and MiraLAX without success.  She states that over the past few days anytime she tries to eat or drink she will have nonbloody/nonbilious vomiting shortly thereafter.  Describes abdominal pain has a full feeling constant severe worsened with palpation and without alleviating factors.  She feels that her abdomen is distended today.  Patient reports that for the past 2 days she has been unable to urinate.  She denies saddle area paresthesias or weakness/numbness of the legs.  She denies any fall or injury.     HPI  Past Medical History:  Diagnosis Date   History of cervical dysplasia    20 yrs ago     There are no active problems to display for this patient.   Past Surgical History:  Procedure Laterality Date   APPENDECTOMY     CESAREAN SECTION       OB History    Gravida  4   Para  3   Term  3   Preterm      AB  1   Living  3     SAB      TAB      Ectopic      Multiple      Live Births  4            Home Medications    Prior to Admission medications   Medication Sig Start Date End Date Taking? Authorizing Provider  busPIRone (BUSPAR) 10 MG tablet Take 10 mg by mouth 2 (two) times a day. 11/25/18  Yes [provider]  escitalopram (LEXAPRO) 20 MG tablet Take 20 mg by mouth daily.    Yes [provider]  ibuprofen (ADVIL) 600 MG tablet Take 600 mg by mouth every 6 (six) hours as needed for mild pain.   Yes [provider]  traZODone (DESYREL) 50 MG tablet Take 50-100 mg by mouth at bedtime. 11/25/18  Yes [provider]  HYDROcodone-acetaminophen (NORCO/VICODIN) 5-325 MG tablet Take 2 tablets by mouth every 4 (four) hours as needed. 12/20/18   Deliah Boston, PA-C  medroxyPROGESTERone (DEPO-PROVERA) 150 MG/ML injection Inject 1 mL (150 mg total) into the muscle every 3 (three) months. Patient not taking: Reported on 12/20/2018 08/17/17   Shelly Bombard, MD  methocarbamol (ROBAXIN) 500 MG tablet Take 1 tablet (500 mg total) by mouth 2 (two) times daily. Patient not taking: Reported on 12/20/2018 10/21/17   [place]idence Lanius A, PA-C  ondansetron (ZOFRAN ODT) 4 MG disintegrating tablet Take 1 tablet (4 mg total) by mouth every 8 (eight) hours as needed for nausea or vomiting. 12/20/18   Nuala Alpha A, PA-C  predniSONE (DELTASONE) 50 MG tablet Take 1 tablet (50 mg total) by mouth daily. Patient not taking: Reported on 12/20/2018 05/21/18   Ok Edwards, PA-C  triamcinolone (KENALOG)  0.025 % ointment Apply 1 application topically 2 (two) times daily. Patient not taking: Reported on 12/20/2018 05/21/18   Ok Edwards, PA-C  urea (CARMOL) 10 % cream Apply topically as needed. Patient not taking: Reported on 12/20/2018 05/21/18   Arturo Morton    Family History Family History  Problem Relation Age of Onset   Hypertension Mother    Breast cancer Mother    Diabetes Father    Hypertension Father    Breast cancer Paternal Grandmother     Social History Social History   Tobacco Use   Smoking status: Current Some Day Smoker    Types: Cigarettes   Smokeless tobacco: Never Used  Substance Use Topics   Alcohol use: Yes    Comment: occ.   Drug use: No     Allergies   Patient has no known allergies.   Review of Systems Review of Systems    Constitutional: Negative.  Negative for chills and fever.  Respiratory: Negative.  Negative for cough and shortness of breath.   Cardiovascular: Negative.  Negative for chest pain.  Gastrointestinal: Positive for abdominal distention, abdominal pain, blood in stool, constipation and vomiting.  Genitourinary: Negative.  Negative for dysuria, hematuria, vaginal bleeding and vaginal discharge.       Difficulty urinating  Neurological: Negative.  Negative for weakness, numbness and headaches.       Denies saddle area paresthesias   Physical Exam Updated Vital Signs BP 138/85    Pulse 63    Temp 98.3 F (36.8 C) (Oral)    Resp 14    SpO2 100%   Physical Exam Constitutional:      Appearance: Normal appearance. She is well-developed. She is not ill-appearing or diaphoretic.     Comments: Anxious and tearful  HENT:     Head: Normocephalic and atraumatic.     Right Ear: External ear normal.     Left Ear: External ear normal.     Nose: Nose normal.  Eyes:     General: Vision grossly intact. Gaze aligned appropriately.     Pupils: Pupils are equal, round, and reactive to light.  Neck:     Musculoskeletal: Normal range of motion.     Trachea: Trachea and phonation normal. No tracheal deviation.  Pulmonary:     Effort: Pulmonary effort is normal. No respiratory distress.  Abdominal:     General: Bowel sounds are decreased. There is distension (Questionable bloating).     Palpations: Abdomen is soft.     Tenderness: There is generalized abdominal tenderness. There is right CVA tenderness and left CVA tenderness. There is no guarding or rebound.  Musculoskeletal: Normal range of motion.  Skin:    General: Skin is warm and dry.  Neurological:     Mental Status: She is alert.     GCS: GCS eye subscore is 4. GCS verbal subscore is 5. GCS motor subscore is 6.     Comments: Speech is clear and goal oriented, follows commands Major Cranial nerves without deficit, no facial droop Moves  extremities without ataxia, coordination intact  Psychiatric:        Behavior: Behavior normal.      ED Treatments / Results  Labs (all labs ordered are listed, but only abnormal results are displayed) Labs Reviewed  CBC WITH DIFFERENTIAL/PLATELET - Abnormal; Notable for the following components:      Result Value   Neutro Abs 8.0 (*)    Lymphs Abs 0.5 (*)    All  other components within normal limits  COMPREHENSIVE METABOLIC PANEL - Abnormal; Notable for the following components:   Potassium 2.9 (*)    Chloride 96 (*)    Glucose, Bld 148 (*)    Total Protein 8.4 (*)    AST 59 (*)    Total Bilirubin 2.0 (*)    Anion gap 18 (*)    All other components within normal limits  LIPASE, BLOOD - Abnormal; Notable for the following components:   Lipase 306 (*)    All other components within normal limits  URINALYSIS, ROUTINE W REFLEX MICROSCOPIC - Abnormal; Notable for the following components:   Specific Gravity, Urine >1.046 (*)    Ketones, ur 80 (*)    All other components within normal limits  I-STAT BETA HCG BLOOD, ED ([facility], [facility], [facility] ONLY)    EKG None  Radiology Ct Abdomen Pelvis W Contrast  Result Date: 12/20/2018 CLINICAL DATA:  Bowel obstruction.  Low-grade. EXAM: CT ABDOMEN AND PELVIS WITH CONTRAST TECHNIQUE: Multidetector CT imaging of the abdomen and pelvis was performed using the standard protocol following bolus administration of intravenous contrast. CONTRAST:  144mL OMNIPAQUE IOHEXOL 300 MG/ML  SOLN COMPARISON:  None. FINDINGS: Lower chest: Lung bases are clear. Hepatobiliary: Focal low attenuation along the falciform ligament consistent with focal fatty infiltration. No biliary duct dilatation. Gallbladder normal. Pancreas: Small amount of fluid positioned between the second portion duodenum and the RIGHT hepatic lobe. This extends along the anterior RIGHT pararenal fascia (image 34/2). There is mild haziness in the fat adjacent to the pancreatic head. No pancreatic duct  dilatation. Body and tail the pancreas appear normal. Spleen: Normal spleen Adrenals/urinary tract: Stomach, small bowel normal. Surgical margin along the cecum presumably relates to appendectomy. The terminal ileum appears normal. The colon and rectosigmoid colon normal. No evidence of bowel obstruction. No evidence of bowel inflammation of than mild inflammation along the second portion duodenum as described in the pancreatic section. Stomach/Bowel: Stomach, small bowel, appendix, and cecum are normal. The colon and rectosigmoid colon are normal. Vascular/Lymphatic: Abdominal aorta is normal caliber. No periportal or retroperitoneal adenopathy. No pelvic adenopathy. Reproductive: Uterus and ovaries normal. Other: No free fluid. Musculoskeletal: No aggressive osseous lesion. IMPRESSION: 1. Mild inflammation along the second portion duodenum and head of the pancreas with small amount of fluid along the RIGHT anterior pararenal fascia. Differential includes mild acute pancreatitis versus duodenitis. The pancreas appears normal otherwise. 2. No gallstones evident on CT exam. Common bile duct normal caliber. 3. No bowel obstruction. 4. Prior appendectomy. Electronically Signed   By: Suzy Bouchard M.D.   On: 12/20/2018 13:02   US Abdomen Limited Ruq  Result Date: 12/20/2018 CLINICAL DATA:  Right upper quadrant abdominal pain for 5 days. EXAM: ULTRASOUND ABDOMEN LIMITED RIGHT UPPER QUADRANT COMPARISON:  CT scan, same date. FINDINGS: Gallbladder: No gallstones or wall thickening visualized. No sonographic Murphy sign noted by sonographer. Common bile duct: Diameter: 3.3 mm Liver: There is diffuse increased echogenicity of the liver and decreased through transmission consistent with fatty infiltration. No focal lesions or biliary dilatation. Portal vein is patent on color Doppler imaging with normal direction of blood flow towards the liver. IMPRESSION: 1. Normal gallbladder.  No findings for acute cholecystitis. 2.  No intra or extrahepatic biliary dilatation. 3. Diffuse fatty infiltration of the liver. Electronically Signed   By: Marijo Sanes M.D.   On: 12/20/2018 14:00    Procedures Fecal disimpaction  Date/Time: 12/20/2018 12:21 PM Performed by: Deliah Boston, PA-C Authorized by:  Deliah Boston, PA-C  Consent: Verbal consent obtained. Risks and benefits: risks, benefits and alternatives were discussed Consent given by: patient Patient understanding: patient states understanding of the procedure being performed Patient consent: the patient's understanding of the procedure matches consent given Relevant documents: relevant documents present and verified Test results: test results available and properly labeled Required items: required blood products, implants, devices, and special equipment available Patient identity confirmed: verbally with patient and arm band Local anesthesia used: no  Anesthesia: Local anesthesia used: no  Sedation: Patient sedated: no  Patient tolerance: patient tolerated the procedure well with no immediate complications Comments: Chaperoned by Johnette Abraham RN. Hard stool ball felt at the very end of my finger, unable to produce any stool.    (including critical care time)  Medications Ordered in ED Medications  sodium chloride (PF) 0.9 % injection (has no administration in time range)  LORazepam (ATIVAN) injection 0.5 mg (0.5 mg Intravenous Given 12/20/18 1107)  morphine 2 MG/ML injection 2 mg (2 mg Intravenous Given 12/20/18 1107)  ondansetron (ZOFRAN) injection 4 mg (4 mg Intravenous Given 12/20/18 1107)  sodium chloride 0.9 % bolus 1,000 mL (0 mLs Intravenous Stopped 12/20/18 1426)  morphine 2 MG/ML injection 2 mg (2 mg Intravenous Given 12/20/18 1147)  iohexol (OMNIPAQUE) 300 MG/ML solution 100 mL (100 mLs Intravenous Contrast Given 12/20/18 1224)  sodium chloride 0.9 % bolus 1,000 mL (1,000 mLs Intravenous New Bag/Given 12/20/18 1429)  potassium chloride 10 mEq  in 100 mL IVPB (10 mEq Intravenous New Bag/Given 12/20/18 1623)  potassium chloride SA (K-DUR) CR tablet 40 mEq (40 mEq Oral Given 12/20/18 1625)  LORazepam (ATIVAN) injection 0.5 mg (0.5 mg Intravenous Given 12/20/18 1623)     Initial Impression / Assessment and Plan / ED Course  I have reviewed the triage vital signs and the nursing notes.  Pertinent labs & imaging results that were available during my care of the patient were reviewed by me and considered in my medical decision making (see chart for details).    44 year old uncomfortable appearing female with generalized abdominal tenderness, flank tenderness and questionable abdominal distention.  Anxious on arrival and tachypneic, no tachycardia or fever, SPO2 100% on room air.  Abdominal lab work ordered, CT abdomen pelvis ordered.  Will give 0.5 mg Ativan for anxiety.  Suspect fecal impaction versus SBO, do not suspect cauda equina as patient is without paresthesias and bladder scan shows 0 mL.  Patient appears dehydrated will give fluid bolus.  Patient seen and evaluated Dr. Sedonia Small advises fecal disimpaction and CT abdomen pelvis. - 11:39 AM: Patient reevaluated resting comfortably in no acute distress states improvement of anxiety and pain following medications today.  I have offered her fecal disimpaction at this time prior to CT abdomen pelvis which she is agreeable to however she is requesting additional pain medication prior to procedure, this is been ordered we will recheck shortly. - Fecal disimpaction unsuccessful small amount of stool felt at the end of my finger.  Chaperoned by Susa Loffler. - Beta-hCG negative CBC nonacute CMP shows potassium 2.9, AST 59, bilirubin 2.0 Urinalysis with elevated specific gravity, 80 ketones, suspect dehydration Lipase 306 CT abdomen pelvis:  IMPRESSION:  1. Mild inflammation along the second portion duodenum and head of  the pancreas with small amount of fluid along the RIGHT anterior  pararenal  fascia. Differential includes mild acute pancreatitis  versus duodenitis. The pancreas appears normal otherwise.  2. No gallstones evident on CT exam. Common bile duct normal  caliber.  3. No bowel obstruction.  4. Prior appendectomy.  - At this point further history reveals patient uses alcohol occasionally to excess, was drinking prior to onset of her symptoms 5 days ago.  Suspect patient with alcohol induced pancreatitis at this time.  Repeat examination of the abdomen does show diffuse tenderness including right upper quadrant tenderness will perform right upper quadrant ultrasound to evaluate for possible stone.  RUQ [place]:  IMPRESSION:  1. Normal gallbladder. No findings for acute cholecystitis.  2. No intra or extrahepatic biliary dilatation.  3. Diffuse fatty infiltration of the liver.  - As no evidence of stone disease, suspect patient with acute alcohol induced pancreatitis. Case rediscussed with Dr. Sedonia Small, plan of care at this time is potassium supplementation, p.o. challenge and discharge with clear liquid diet. - Patient given p.o. and IV potassium supplementation, IV fluids, pain and nausea control.  She reports that she is feeling greatly improved and is requesting discharge.  Informed by RN that patient has been ambulating around emergency department and was able to provide a urine sample without assistance.  No indication for further imaging or work-up at this time. I have discussed with her diagnosis of pancreatitis and advised clear liquid diet and PCP follow-up, patient will be provided with ODT Zofran, continued potassium supplementation suspect hypokalemia will resolve with return to normal p.o. intake, additionally will discharge with 6 pills Norco for acute pain related to her pancreatitis. Discussed precautions regarding narcotics, patient with ride home from ER. Also counseled patient on alcohol use.   At this time there does not appear to be any evidence of an acute  emergency medical condition and the patient appears stable for discharge with appropriate outpatient follow up. Diagnosis was discussed with patient who verbalizes understanding of care plan and is agreeable to discharge. I have discussed return precautions with patient who verbalizes understanding of return precautions. Patient encouraged to follow-up with their PCP. All questions answered.  Patient has been discharged in good condition.  Patient's case rediscussed with Dr. Johnney Killian who is attending at shift change, agrees with plan to discharge with follow-up.   Note: Portions of this report may have been transcribed using voice recognition software. Every effort was made to ensure accuracy; however, inadvertent computerized transcription errors may still be present. Final Clinical Impressions(s) / ED Diagnoses   Final diagnoses:  Pain  RUQ abdominal pain  Alcohol-induced acute pancreatitis, unspecified complication status    ED Discharge Orders         Ordered    ondansetron (ZOFRAN ODT) 4 MG disintegrating tablet  Every 8 hours PRN     12/20/18 1736    HYDROcodone-acetaminophen (NORCO/VICODIN) 5-325 MG tablet  Every 4 hours PRN     12/20/18 1736           Gari Crown 12/20/18 1747    Maudie Flakes, MD 12/23/18 1100

## 2018-12-20 NOTE — Discharge Instructions (Addendum)
You have been diagnosed today with acute alcohol induced pancreatitis.  At this time there does not appear to be the presence of an emergent medical condition, however there is always the potential for conditions to change. Please read and follow the below instructions.  Please return to the Emergency Department immediately for any new or worsening symptoms or if your symptoms do not improve within 2 days. Please be sure to follow up with your Primary Care Provider within one week regarding your visit today; please call their office to schedule an appointment even if you are feeling better for a follow-up visit. You may use the nausea medication Zofran as prescribed to help with nausea and vomiting. You may use the pain medication Norco as prescribed for severe pain.  Do not drive or operate heavy machinery will take this medication as will make you drowsy.  Do not take Norco with other sedating medications as this will worsen side effects.  Do not drink alcohol with Norco and this will worsen side effects. Please follow the instructions regarding a clear liquid diet for the next few days, slowly reintroduce small amounts of bland foods next few days as tolerated. It is likely that alcohol is a contributing factor to the onset of acute pancreatitis, please discuss this with your primary care provider in attempt to reduce consumption as alcohol use may cause a recurrence of your symptoms. Please take the potassium supplement as prescribed for the next 3 days to help your low potassium level.  Get help right away if: You cannot eat or keep fluids down. Your pain becomes severe. Your skin or the white part of your eyes turns yellow (jaundice). You have sudden swelling in your abdomen. You vomit. You feel dizzy or you faint. You have chest pain. You have shortness of breath. You have a fever Any new/concerning or worsening symptoms  Please read the additional information packets attached to your  discharge summary.  Do not take your medicine if  develop an itchy rash, swelling in your mouth or lips, or difficulty breathing; call 911 and seek immediate emergency medical attention if this occurs.

## 2018-12-20 NOTE — ED Notes (Signed)
Pt provided turkey sandwich and water.

## 2018-12-20 NOTE — ED Notes (Signed)
Patient transported to CT 

## 2018-12-20 NOTE — ED Triage Notes (Signed)
Pt BIBA from home.    Reports being unable to urinate nor have a bowel movement x 5 days.  When she eats, she then vomits.

## 2018-12-20 NOTE — ED Notes (Signed)
Bed: [facility] Expected date:  Expected time:  Means of arrival:  Comments: 

## 2019-11-04 ENCOUNTER — Other Ambulatory Visit: Payer: Self-pay

## 2019-11-04 ENCOUNTER — Encounter (HOSPITAL_COMMUNITY): Payer: Self-pay

## 2019-11-04 ENCOUNTER — Ambulatory Visit (HOSPITAL_COMMUNITY)
Admission: EM | Admit: 2019-11-04 | Discharge: 2019-11-04 | Disposition: A | Discharge status: Home / Self Care | Payer: Medicaid Other | Attending: Physician Assistant | Admitting: Physician Assistant

## 2019-11-04 DIAGNOSIS — L609 Nail disorder, unspecified: Secondary | ICD-10-CM | POA: Diagnosis not present

## 2019-11-04 DIAGNOSIS — M79645 Pain in left finger(s): Secondary | ICD-10-CM | POA: Diagnosis not present

## 2019-11-04 DIAGNOSIS — I7300 Raynaud's syndrome without gangrene: Secondary | ICD-10-CM | POA: Diagnosis not present

## 2019-11-04 DIAGNOSIS — R2231 Localized swelling, mass and lump, right upper limb: Secondary | ICD-10-CM

## 2019-11-04 DIAGNOSIS — M79644 Pain in right finger(s): Secondary | ICD-10-CM

## 2019-11-04 MED ORDER — SULFAMETHOXAZOLE-TRIMETHOPRIM 800-160 MG PO TABS: 1.0000 | ORAL_TABLET | Freq: Two times a day (BID) | ORAL | 0 refills | Status: AC

## 2019-11-04 MED ORDER — TRAMADOL HCL 50 MG PO TABS: 50.0000 mg | ORAL_TABLET | Freq: Four times a day (QID) | ORAL | 0 refills | Status: AC | PRN

## 2019-11-04 NOTE — Discharge Instructions (Signed)
I sent in some different pain medicine.  You may take this 1 tablet every 6 hours as needed. -Continue Tylenol.  You may continue ibuprofen however sure you are taking the proper dosage and the proper amounts.  Maximum dosage would be 800 mg every 8 hours.  Please schedule follow-up with the rheumatology group.  Also sent in antibiotic though we have low suspicion for infection this is to cover for possible infection.  Take this twice a day for 7 days.  In the short-term if you have worsening complaints please follow-up with your primary care or this clinic.

## 2019-11-04 NOTE — ED Provider Notes (Signed)
[facility]    CSN: [id number] Arrival date & time: 11/04/19  1119      History   Chief Complaint Chief Complaint  Patient presents with  . Mass    HPI [name] is a 44 y.o. female.   Patient presents to urgent care for evaluation of mass on right thumb with associated pain as well as chronic changes to her fingernails. She reports this is she was been ongoing with regard to her fingernail deterioration. She reports she is followed with her primary care for this and was recommended to have follow-up with dermatology. She reports the largest concern today is the mass and pain in her right thumb. She reports over the last 1 week to 2-week timeframe she had a small growth at the tip of her thumb develop. She reports this started as a small pinprick abrasion and progressed to a hard growth. She reports this mass will occasionally bleed. She also reports irritation around this mass. She reports the thumb has swollen over that timeframe and to become painful. She reports the swelling has come down over the last few days however pain persists. She reports she has had a similar type mass grow under the fingernail of her third digit on the right hand. This caused the fingernail to come off. She reports this was sometime ago.  She reports that whenever it is cold her fingertips will get very white and painful. She also reports this happens when moving her fingers in certain ways. She endorses a lot of joint pain in all of her finger knuckles and joints. She denies significant joint pain and other locations. She is concerned that she may have rheumatoid arthritis due to this.  She has not had fever or chills.  She reports she has a dermatology appointment in June     Past Medical History:  Diagnosis Date  . History of cervical dysplasia    20 yrs ago     There are no problems to display for this patient.   Past Surgical History:  Procedure Laterality Date  .  APPENDECTOMY    . CESAREAN SECTION      OB History    Gravida  4   Para  3   Term  3   Preterm      AB  1   Living  3     SAB      TAB      Ectopic      Multiple      Live Births  4            Home Medications    Prior to Admission medications   Medication Sig Start Date End Date Taking? Authorizing Provider  busPIRone (BUSPAR) 10 MG tablet Take 10 mg by mouth 2 (two) times a day. 11/25/18   [provider]  escitalopram (LEXAPRO) 20 MG tablet Take 20 mg by mouth daily.    [provider]  HYDROcodone-acetaminophen (NORCO/VICODIN) 5-325 MG tablet Take 2 tablets by mouth every 4 (four) hours as needed. 12/20/18   Nuala Alpha A, PA-C  ibuprofen (ADVIL) 600 MG tablet Take 600 mg by mouth every 6 (six) hours as needed for mild pain.    [provider]  medroxyPROGESTERone (DEPO-PROVERA) 150 MG/ML injection Inject 1 mL (150 mg total) into the muscle every 3 (three) months. Patient not taking: Reported on 12/20/2018 08/17/17   Shelly Bombard, MD  methocarbamol (ROBAXIN) 500 MG tablet Take 1 tablet (  500 mg total) by mouth 2 (two) times daily. Patient not taking: Reported on 12/20/2018 10/21/17   Providence Lanius A, PA-C  ondansetron (ZOFRAN ODT) 4 MG disintegrating tablet Take 1 tablet (4 mg total) by mouth every 8 (eight) hours as needed for nausea or vomiting. 12/20/18   Nuala Alpha A, PA-C  potassium chloride (K-DUR) 10 MEQ tablet Take 2 tablets (20 mEq total) by mouth daily for 3 days. 12/20/18 12/23/18  Nuala Alpha A, PA-C  predniSONE (DELTASONE) 50 MG tablet Take 1 tablet (50 mg total) by mouth daily. Patient not taking: Reported on 12/20/2018 05/21/18   Ok Edwards, PA-C  sulfamethoxazole-trimethoprim (BACTRIM DS) 800-160 MG tablet Take 1 tablet by mouth 2 (two) times daily for 7 days. 11/04/19 11/11/19  Rozlyn Yerby, Marguerita Beards, PA-C  traMADol (ULTRAM) 50 MG tablet Take 1 tablet (50 mg total) by mouth every 6 (six) hours as needed for up to 5 days.  11/04/19 11/09/19  Marletta Bousquet, Marguerita Beards, PA-C  traZODone (DESYREL) 50 MG tablet Take 50-100 mg by mouth at bedtime. 11/25/18   [provider]  triamcinolone (KENALOG) 0.025 % ointment Apply 1 application topically 2 (two) times daily. Patient not taking: Reported on 12/20/2018 05/21/18   Ok Edwards, PA-C  urea (CARMOL) 10 % cream Apply topically as needed. Patient not taking: Reported on 12/20/2018 05/21/18   Arturo Morton    Family History Family History  Problem Relation Age of Onset  . Hypertension Mother   . Breast cancer Mother   . Diabetes Father   . Hypertension Father   . Breast cancer Paternal Grandmother     Social History Social History   Tobacco Use  . Smoking status: Current Some Day Smoker    Types: Cigarettes  . Smokeless tobacco: Never Used  Substance Use Topics  . Alcohol use: Yes    Comment: occ.  . Drug use: No     Allergies   Patient has no known allergies.   Review of Systems Review of Systems   Physical Exam Triage Vital Signs ED Triage Vitals  Enc Vitals Group     BP 11/04/19 1153 (!) 131/91     Pulse Rate 11/04/19 1153 (!) 101     Resp 11/04/19 1153 19     Temp 11/04/19 1153 98.4 F (36.9 C)     Temp Source 11/04/19 1153 Oral     SpO2 11/04/19 1153 100 %     Weight --      Height --      Head Circumference --      Peak Flow --      Pain Score 11/04/19 1151 10     Pain Loc --      Pain Edu? --      Excl. in [place]? --    No data found.  Updated Vital Signs BP (!) 131/91 (BP Location: Right Arm)   Pulse (!) 101   Temp 98.4 F (36.9 C) (Oral)   Resp 19   LMP 10/12/2019 (Exact Date)   SpO2 100%   Visual Acuity Right Eye Distance:   Left Eye Distance:   Bilateral Distance:    Right Eye Near:   Left Eye Near:    Bilateral Near:     Physical Exam Vitals and nursing note reviewed.  Constitutional:      General: She is not in acute distress.    Appearance: She is well-developed. She is not ill-appearing.  HENT:     Head:  Normocephalic and atraumatic.  Eyes:     Conjunctiva/sclera: Conjunctivae normal.  Cardiovascular:     Rate and Rhythm: Normal rate.  Pulmonary:     Effort: Pulmonary effort is normal. No respiratory distress.  Skin:    General: Skin is warm and dry.     Comments: Right thumb and fingertip images can be seen below.  Growth on thumb is hard and nonremoval.  It is important to note that the pictures do depict positional blanching of the skin. This positional blanching occurs with some extension and refills immediately with bending of the thumb. There is also significant blanching with slight touch and compression of the thumb and fingers.  The right thumb does have some swelling and tenderness throughout. There is minimal warmth. No fluctuance.  Neurological:     Mental Status: She is alert.                  UC Treatments / Results  Labs (all labs ordered are listed, but only abnormal results are displayed) Labs Reviewed - No data to display  EKG   Radiology No results found.  Procedures Procedures (including critical care time)  Medications Ordered in UC Medications - No data to display  Initial Impression / Assessment and Plan / UC Course  I have reviewed the triage vital signs and the nursing notes.  Pertinent labs & imaging results that were available during my care of the patient were reviewed by me and considered in my medical decision making (see chart for details).     #Mass on right thumb #Irregular #Finger pain #Raynaud's phenomenon by history Patient is a 44 year old female with finger nail, skin and vascular irregularities in bilateral hands. Etiology of this is not entirely clear at this time. There is certainly some component of Raynaud's. Some consideration for possible pyogenic granuloma as the mass however this would not explain the other changes and significant changes in vascular flow. Low suspicion for infection however given swelling  and tenderness will cover with short course of Bactrim. Give her some tramadol for her pain and recommend Tylenol and ibuprofen for longer-term management. Rheumatology follow-up was recommended and also recommended she continue to keep her dermatology appointment. Patient to follow-up with primary care for other more short-term concerns of her problems at this visit. Patient verbalized understanding of this and agreement with this plan  This patient was discussed and examined with attending physician on shift. Plan of care was also discussed with attending physician on shift.  Final Clinical Impressions(s) / UC Diagnoses   Final diagnoses:  Pain in finger of both hands  Irregular finger nails  Raynaud's phenomenon without gangrene  Mass of skin of right thumb     Discharge Instructions     I sent in some different pain medicine.  You may take this 1 tablet every 6 hours as needed. -Continue Tylenol.  You may continue ibuprofen however sure you are taking the proper dosage and the proper amounts.  Maximum dosage would be 800 mg every 8 hours.  Please schedule follow-up with the rheumatology group.  Also sent in antibiotic though we have low suspicion for infection this is to cover for possible infection.  Take this twice a day for 7 days.  In the short-term if you have worsening complaints please follow-up with your primary care or this clinic.      ED Prescriptions    Medication Sig Dispense Auth. Provider   traMADol (ULTRAM) 50 MG tablet Take 1 tablet (  50 mg total) by mouth every 6 (six) hours as needed for up to 5 days. 15 tablet Jacquie Lukes, Marguerita Beards, PA-C   sulfamethoxazole-trimethoprim (BACTRIM DS) 800-160 MG tablet Take 1 tablet by mouth 2 (two) times daily for 7 days. 14 tablet Kadien Lineman, Marguerita Beards, PA-C     I have reviewed the PDMP during this encounter.   Purnell Shoemaker, PA-C 11/04/19 2352

## 2019-11-04 NOTE — ED Triage Notes (Signed)
Patient reports nail bed deterioration for months, but reports grown on end of finger appeared two weeks ago.

## 2020-07-30 ENCOUNTER — Other Ambulatory Visit: Payer: Self-pay

## 2020-07-30 ENCOUNTER — Emergency Department (HOSPITAL_COMMUNITY): Payer: Medicaid Other

## 2020-07-30 ENCOUNTER — Encounter (HOSPITAL_COMMUNITY): Payer: Self-pay | Admitting: Emergency Medicine

## 2020-07-30 ENCOUNTER — Emergency Department (HOSPITAL_COMMUNITY)
Admission: EM | Admit: 2020-07-30 | Discharge: 2020-07-31 | Disposition: A | Discharge status: Home / Self Care | Payer: Medicaid Other | Attending: Emergency Medicine | Admitting: Emergency Medicine

## 2020-07-30 DIAGNOSIS — F17210 Nicotine dependence, cigarettes, uncomplicated: Secondary | ICD-10-CM | POA: Insufficient documentation

## 2020-07-30 DIAGNOSIS — R079 Chest pain, unspecified: Secondary | ICD-10-CM | POA: Diagnosis not present

## 2020-07-30 DIAGNOSIS — R0602 Shortness of breath: Secondary | ICD-10-CM | POA: Insufficient documentation

## 2020-07-30 HISTORY — DX: Post-traumatic stress disorder, unspecified: F43.10

## 2020-07-30 LAB — BASIC METABOLIC PANEL
Anion gap: 20 — ABNORMAL HIGH (ref 5–15)
BUN: 16 mg/dL (ref 6–20)
CO2: 20 mmol/L — ABNORMAL LOW (ref 22–32)
Calcium: 9.5 mg/dL (ref 8.9–10.3)
Chloride: 100 mmol/L (ref 98–111)
Creatinine, Ser: 1.21 mg/dL — ABNORMAL HIGH (ref 0.44–1.00)
GFR, Estimated: 57 mL/min — ABNORMAL LOW (ref >60)
Glucose, Bld: 93 mg/dL (ref 70–99)
Potassium: 3.7 mmol/L (ref 3.5–5.1)
Sodium: 140 mmol/L (ref 135–145)

## 2020-07-30 LAB — CBC
HCT: 37.3 % (ref 36.0–46.0)
Hemoglobin: 12.3 g/dL (ref 12.0–15.0)
MCH: 30.5 pg (ref 26.0–34.0)
MCHC: 33 g/dL (ref 30.0–36.0)
MCV: 92.6 fL (ref 80.0–100.0)
Platelets: 261 10*3/uL (ref 150–400)
RBC: 4.03 MIL/uL (ref 3.87–5.11)
RDW: 12.9 % (ref 11.5–15.5)
WBC: 8 10*3/uL (ref 4.0–10.5)
nRBC: 0 % (ref 0.0–0.2)

## 2020-07-30 LAB — TROPONIN I (HIGH SENSITIVITY)
Troponin I (High Sensitivity): 8 ng/L (ref <18)
Troponin I (High Sensitivity): 8 ng/L (ref <18)

## 2020-07-30 LAB — D-DIMER, QUANTITATIVE: D-Dimer, Quant: 1.15 ug/mL-FEU — ABNORMAL HIGH (ref 0.00–0.50)

## 2020-07-30 IMAGING — CT CT ANGIO CHEST
2 of 7 series · 19 of 46 positions shown · IV contrast (APPLIED)
Comparison: Chest radiograph [DATE].  CT chest [DATE]

CLINICAL DATA: Positive D-dimer. Pulmonary embolus suspected with
low to intermediate probability.

EXAM:
CT ANGIOGRAPHY CHEST WITH CONTRAST
TECHNIQUE: Multidetector CT imaging of the chest was performed using the
standard protocol during bolus administration of intravenous
contrast. Multiplanar CT image reconstructions and MIPs were
obtained to evaluate the vascular anatomy.
CONTRAST:  42mL OMNIPAQUE IOHEXOL 350 MG/ML SOLN

[Series 9: cor · coronal · 0.73mm/px · 3 of 129 slices shown]
[im 33/129  soft-tissue]
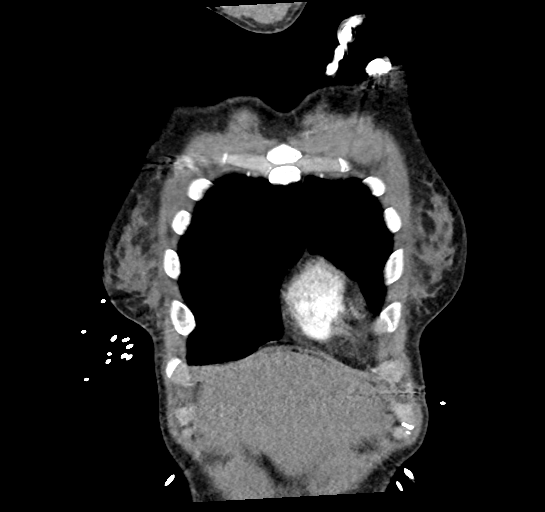
[im 65/129  soft-tissue]
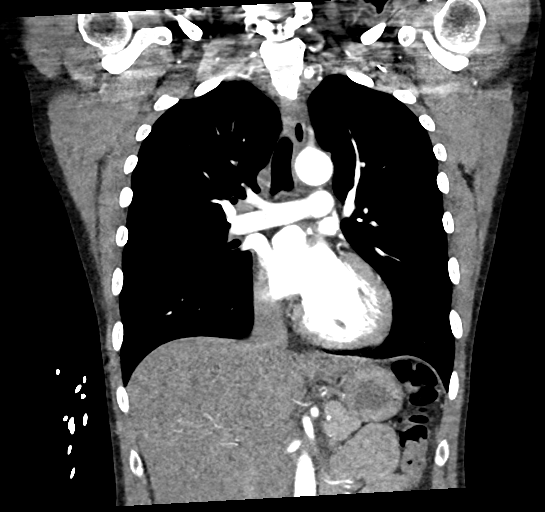
[im 97/129  soft-tissue]
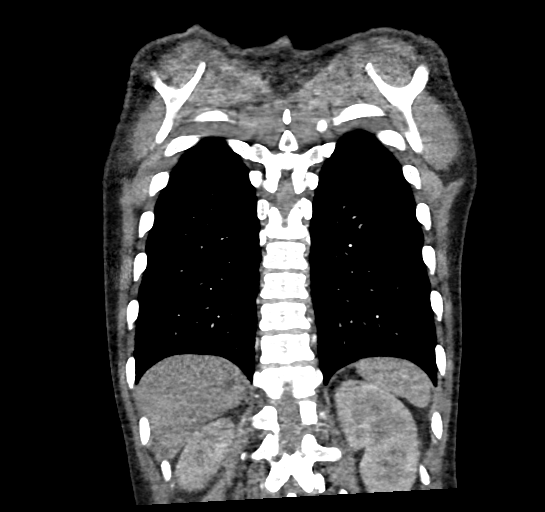

[Series 14: thins · axial · 0.64mm/px · z∈[-38,+284]mm · 16 of 518 slices shown]
[im 29/518  lung]
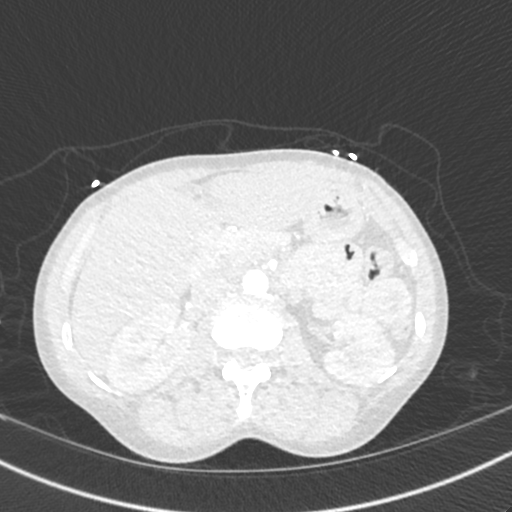
[im 58/518  soft-tissue]
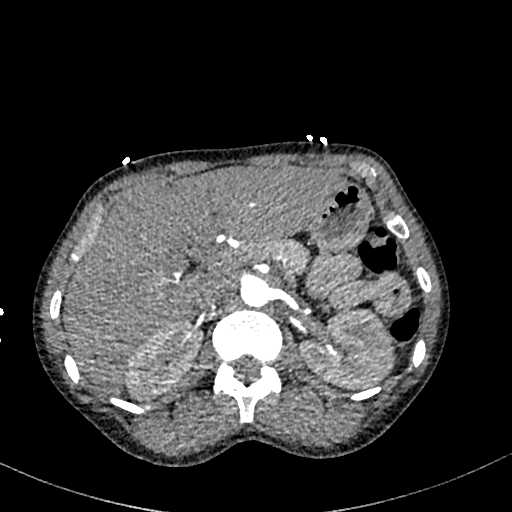
[im 87/518  lung]
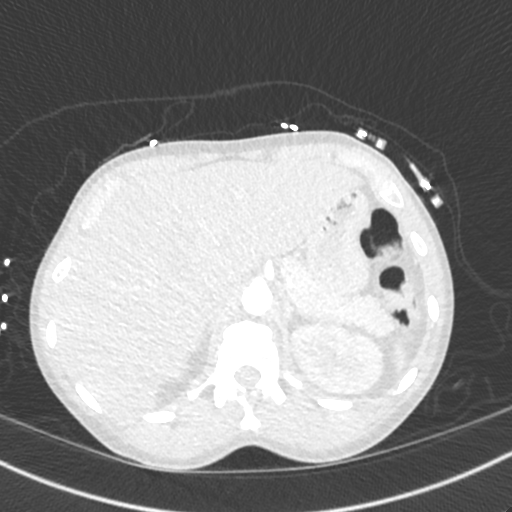
[im 115/518  soft-tissue]
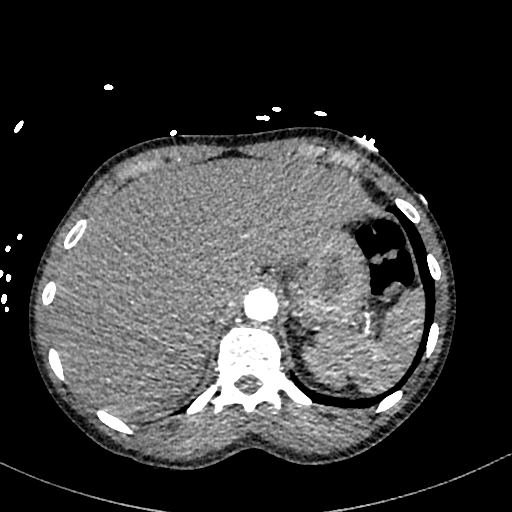
[im 144/518  lung]
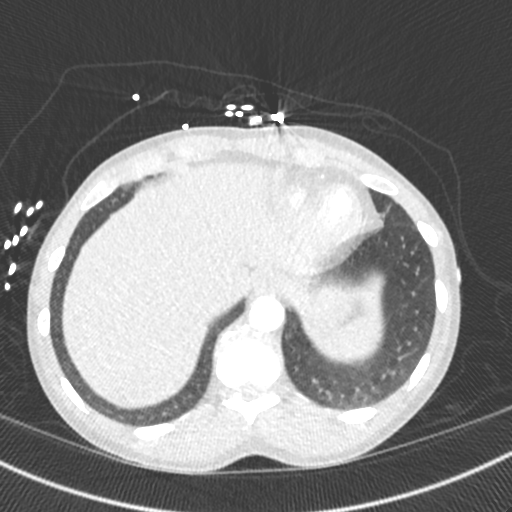
[im 173/518  soft-tissue]
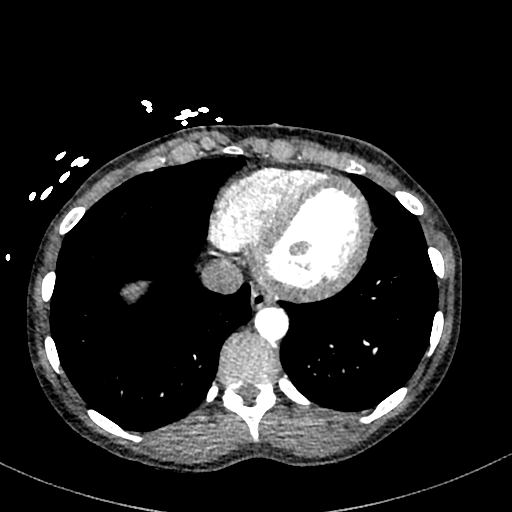
[im 202/518  lung]
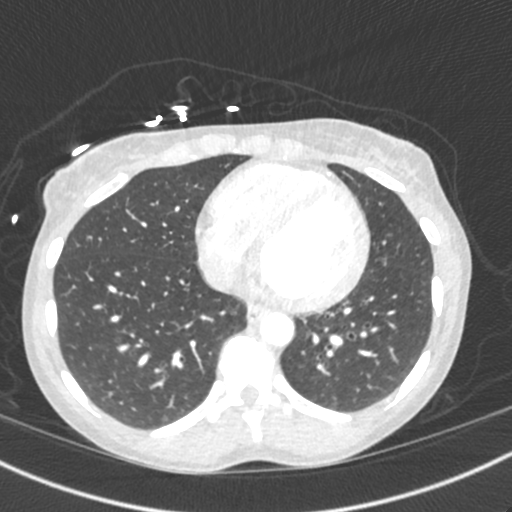
[im 230/518  soft-tissue]
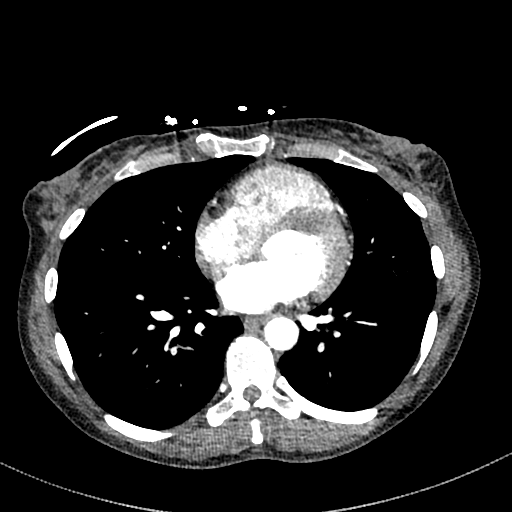
[im 288/518  lung]
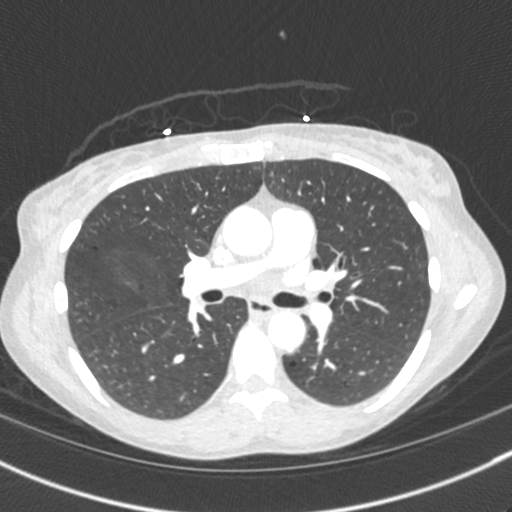
[im 316/518  soft-tissue]
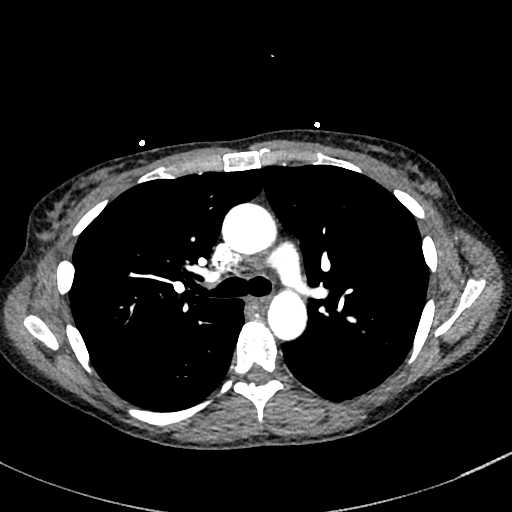
[im 345/518  lung]
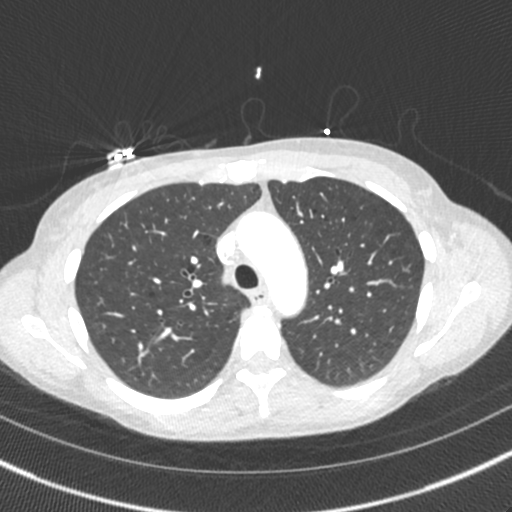
[im 374/518  soft-tissue]
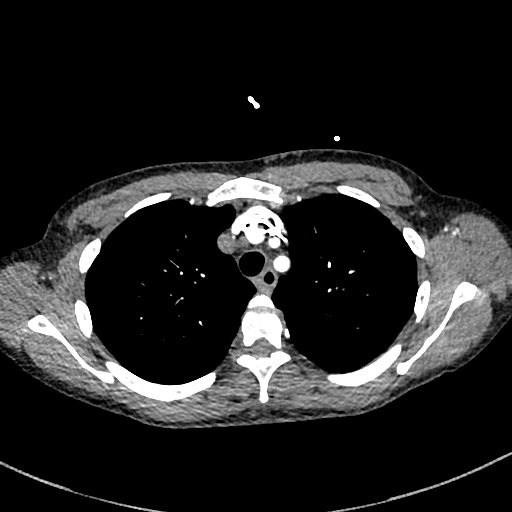
[im 403/518  lung]
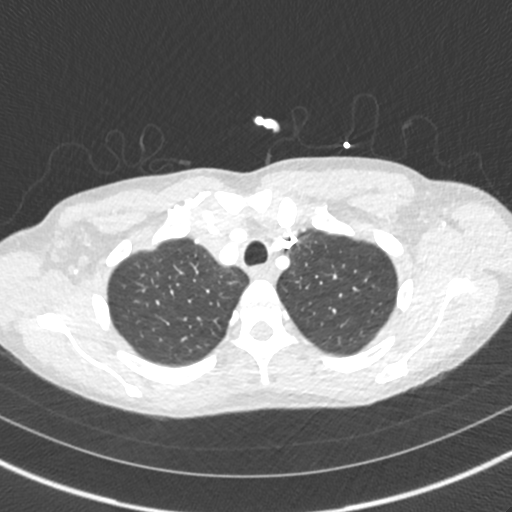
[im 431/518  soft-tissue]
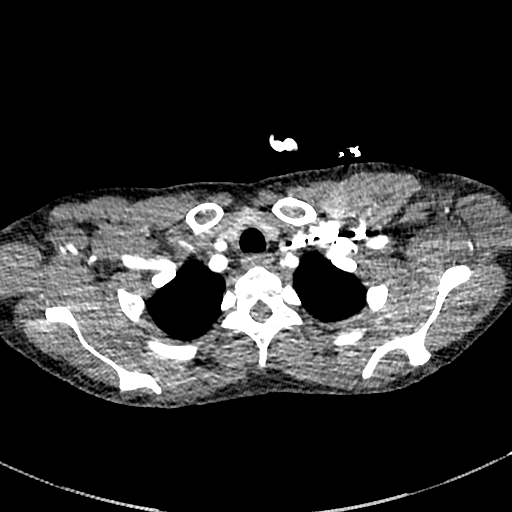
[im 460/518  lung]
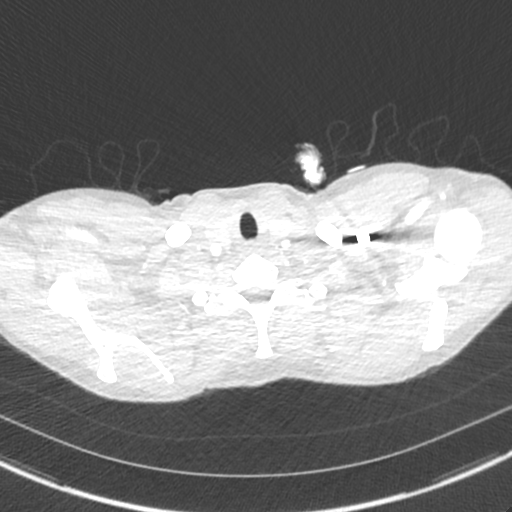
[im 489/518  soft-tissue]
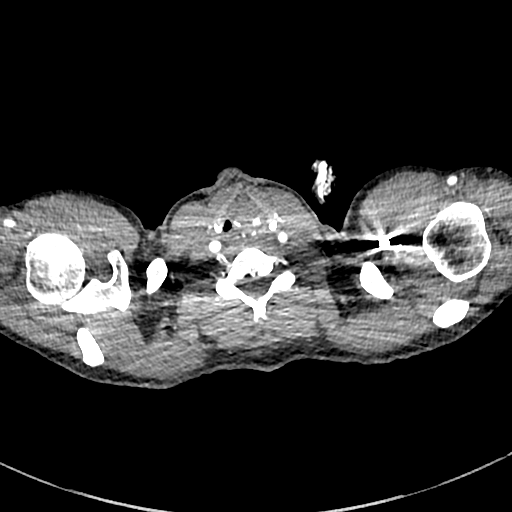

[19 of 46 positions shown; findings below may reference images not displayed]

FINDINGS: Cardiovascular: There is good opacification of the central and
segmental pulmonary arteries. No focal filling defects. No evidence
of significant pulmonary embolus. Normal heart size. No pericardial
effusions. Normal caliber thoracic aorta. No aneurysm or dissection.
Great vessel origins are patent.

Mediastinum/Nodes: Esophagus is decompressed. No significant
lymphadenopathy in the chest.

Lungs/Pleura: Mild emphysematous changes in the lungs. No airspace
disease or consolidation. No pleural effusions. No pneumothorax.
Airways are patent.

Upper Abdomen: No acute abnormalities demonstrated in the visualized
upper abdomen. Probable focal fatty infiltration in the liver
adjacent to the falciform ligament.

Musculoskeletal: No chest wall abnormality. No acute or significant
osseous findings.

Review of the MIP images confirms the above findings.
IMPRESSION: 1. No evidence of significant pulmonary embolus.
2. No evidence of active pulmonary disease.
3. Mild emphysematous changes in the lungs.

Emphysema ([4Y]-[4Y]).

## 2020-07-30 IMAGING — CR DG CHEST 2V
2 series · 2 of 2 positions shown · non-contrast
Comparison: [DATE]

CLINICAL DATA: Chest pain

EXAM:
CHEST - 2 VIEW

[chest pa]
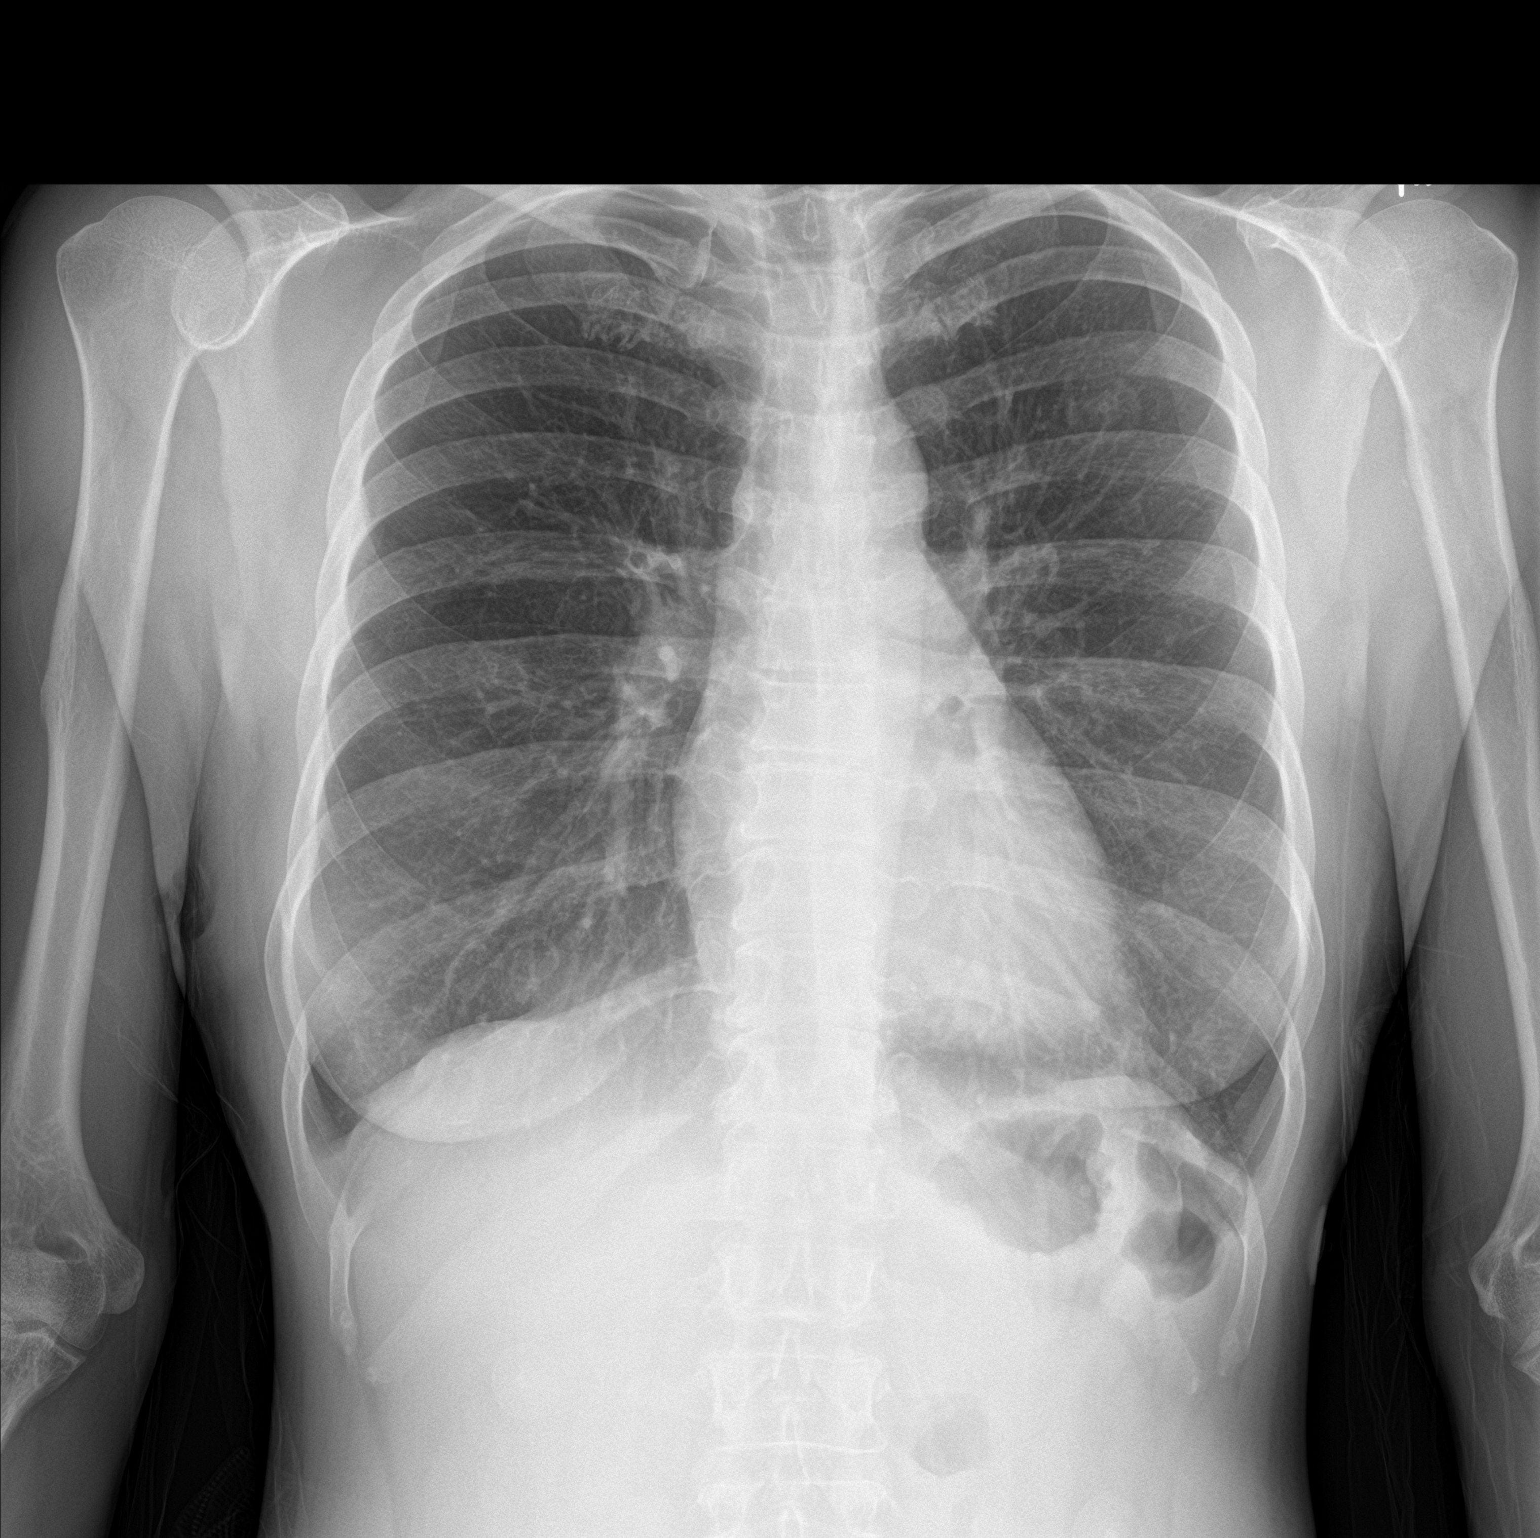

[chest lat]
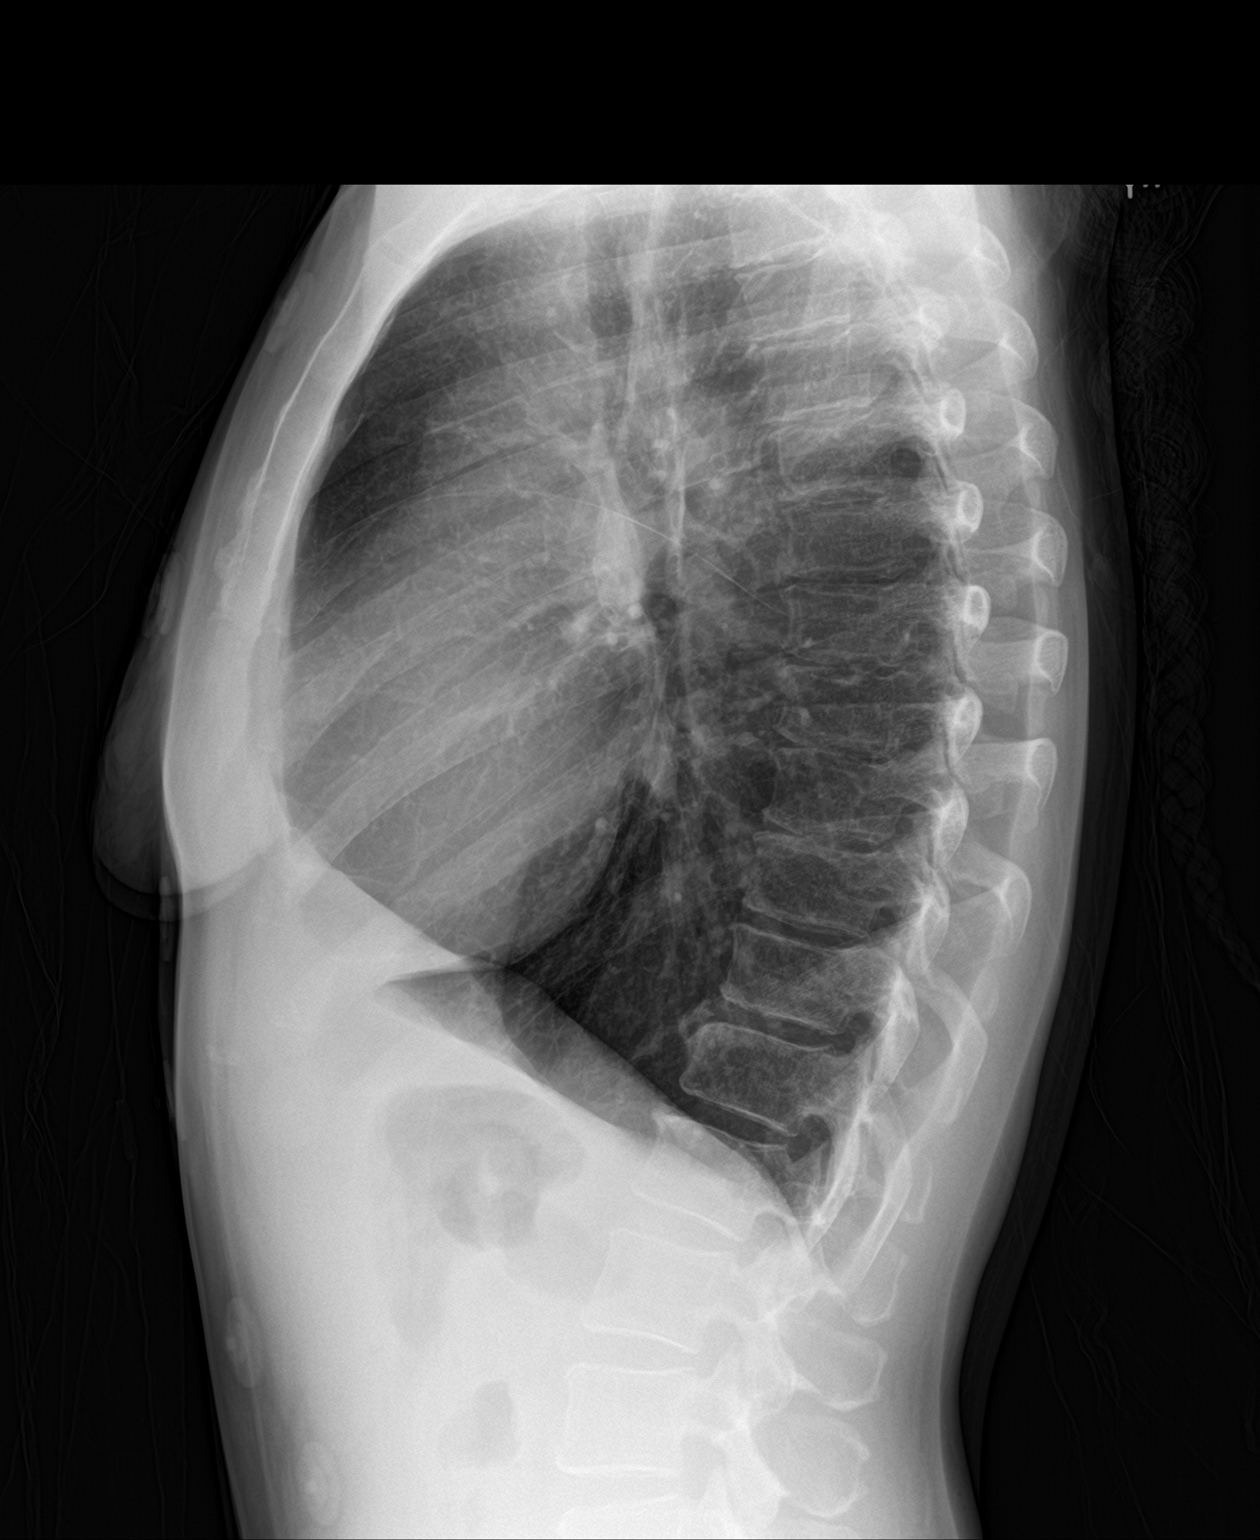

[2 of 2 positions shown; findings below may reference images not displayed]

FINDINGS: The heart size and mediastinal contours are within normal limits.
Both lungs are clear. The visualized skeletal structures are
unremarkable.
IMPRESSION: No active cardiopulmonary disease.

## 2020-07-30 MED ORDER — IOHEXOL 350 MG/ML SOLN
42.0000 mL | Freq: Once | INTRAVENOUS | Status: AC | PRN
  Administered 2020-07-30: 42 mL via INTRAVENOUS

## 2020-07-30 MED ORDER — SODIUM CHLORIDE 0.9 % IV BOLUS
1000.0000 mL | Freq: Once | INTRAVENOUS | Status: AC
  Administered 2020-07-30: 1000 mL via INTRAVENOUS

## 2020-07-30 NOTE — ED Triage Notes (Signed)
Patient here with complaint of chest pain, shortness of breath, x4 days. Patient is requesting a COVID test. Patient alert, oriented, ambulatory.

## 2020-07-30 NOTE — ED Provider Notes (Signed)
[facility] EMERGENCY DEPARTMENT Provider Note   CSN: [id number] Arrival date & time: 07/30/20  1748     History Chief Complaint  Patient presents with  . Chest Pain    [name] is a 45 y.o. female w PMHx PTSD, anxiety, presenting to the emergency department with complaint of 4 days of central chest pain.  Chest pain is worse with particular movement and palpation.  She also endorses shortness of breath that this has been ongoing for 1 year.  She endorses severe anxiety which is being treated by her PCP.  She states she has been having unintended weight loss over some time now with poor appetite.  This is being treated with megestrol to induce appetite.  She also states she has been having chronic intermittent nausea and vomiting for a year which is treated with Zofran.  She is having some pain in her left leg though no swelling.  No fevers or new cough.  She endorses tobacco use occasionally.  No cardiac history.  She states she feels dehydrated in her nail beds.  Per chart review, patient has had frequent visits with PCP over the last few months including this month. She appears to be followed closely for her weight loss which is attributed to anxiety/PTSD state.  The history is provided by the patient and medical records.       Past Medical History:  Diagnosis Date  . History of cervical dysplasia    20 yrs ago   . PTSD (post-traumatic stress disorder)     There are no problems to display for this patient.   Past Surgical History:  Procedure Laterality Date  . APPENDECTOMY    . CESAREAN SECTION       OB History    Gravida  4   Para  3   Term  3   Preterm      AB  1   Living  3     SAB      IAB      Ectopic      Multiple      Live Births  4           Family History  Problem Relation Age of Onset  . Hypertension Mother   . Breast cancer Mother   . Diabetes Father   . Hypertension Father   . Breast cancer Paternal  Grandmother     Social History   Tobacco Use  . Smoking status: Current Some Day Smoker    Types: Cigarettes  . Smokeless tobacco: Never Used  Vaping Use  . Vaping Use: Never used  Substance Use Topics  . Alcohol use: Yes    Comment: occ.  . Drug use: No    Home Medications Prior to Admission medications   Medication Sig Start Date End Date Taking? Authorizing Provider  busPIRone (BUSPAR) 10 MG tablet Take 10 mg by mouth 2 (two) times a day. 11/25/18   [provider]  escitalopram (LEXAPRO) 20 MG tablet Take 20 mg by mouth daily.    [provider]  HYDROcodone-acetaminophen (NORCO/VICODIN) 5-325 MG tablet Take 2 tablets by mouth every 4 (four) hours as needed. 12/20/18   Nuala Alpha A, PA-C  ibuprofen (ADVIL) 600 MG tablet Take 600 mg by mouth every 6 (six) hours as needed for mild pain.    [provider]  medroxyPROGESTERone (DEPO-PROVERA) 150 MG/ML injection Inject 1 mL (150 mg total) into the muscle every 3 (three) months. Patient  not taking: Reported on 12/20/2018 08/17/17   Shelly Bombard, MD  methocarbamol (ROBAXIN) 500 MG tablet Take 1 tablet (500 mg total) by mouth 2 (two) times daily. Patient not taking: Reported on 12/20/2018 10/21/17   Providence Lanius A, PA-C  ondansetron (ZOFRAN ODT) 4 MG disintegrating tablet Take 1 tablet (4 mg total) by mouth every 8 (eight) hours as needed for nausea or vomiting. 12/20/18   Nuala Alpha A, PA-C  potassium chloride (K-DUR) 10 MEQ tablet Take 2 tablets (20 mEq total) by mouth daily for 3 days. 12/20/18 12/23/18  Nuala Alpha A, PA-C  predniSONE (DELTASONE) 50 MG tablet Take 1 tablet (50 mg total) by mouth daily. Patient not taking: Reported on 12/20/2018 05/21/18   Ok Edwards, PA-C  traZODone (DESYREL) 50 MG tablet Take 50-100 mg by mouth at bedtime. 11/25/18   [provider]  triamcinolone (KENALOG) 0.025 % ointment Apply 1 application topically 2 (two) times daily. Patient not taking:  Reported on 12/20/2018 05/21/18   Ok Edwards, PA-C  urea (CARMOL) 10 % cream Apply topically as needed. Patient not taking: Reported on 12/20/2018 05/21/18   Ok Edwards, PA-C    Allergies    Patient has no known allergies.  Review of Systems   Review of Systems  All other systems reviewed and are negative.   Physical Exam Updated Vital Signs BP (!) 114/93   Pulse 63   Temp 98.4 F (36.9 C) (Oral)   Resp 20   Ht 5\' 3"  (1.6 m)   Wt 44.5 kg   SpO2 100%   BMI 17.36 kg/m   Physical Exam Vitals and nursing note reviewed.  Constitutional:      Appearance: She is well-developed and well-nourished.  HENT:     Head: Normocephalic and atraumatic.  Eyes:     Conjunctiva/sclera: Conjunctivae normal.  Cardiovascular:     Rate and Rhythm: Normal rate and regular rhythm.  Pulmonary:     Effort: Pulmonary effort is normal. No respiratory distress.     Breath sounds: Normal breath sounds.  Chest:     Chest wall: Tenderness present.  Abdominal:     General: Bowel sounds are normal.     Palpations: Abdomen is soft.     Tenderness: There is no abdominal tenderness. There is no guarding or rebound.  Musculoskeletal:     Right lower leg: No edema.     Left lower leg: No edema.     Comments: Some TTP to left lateral distal thigh (chronic). No swelling or calf TTP. No redness or warmth.  Skin:    General: Skin is warm.  Neurological:     Mental Status: She is alert.  Psychiatric:        Mood and Affect: Mood and affect normal.        Behavior: Behavior normal.     ED Results / Procedures / Treatments   Labs (all labs ordered are listed, but only abnormal results are displayed) Labs Reviewed  BASIC METABOLIC PANEL - Abnormal; Notable for the following components:      Result Value   CO2 20 (*)    Creatinine, Ser 1.21 (*)    GFR, Estimated 57 (*)    Anion gap 20 (*)    All other components within normal limits  D-DIMER, QUANTITATIVE (NOT AT [facility]) - Abnormal; Notable for the  following components:   D-Dimer, Quant 1.15 (*)    All other components within normal limits  CBC  TROPONIN I (  HIGH SENSITIVITY)  TROPONIN I (HIGH SENSITIVITY)    EKG EKG Interpretation  Date/Time:  Friday July 30 2020 20:48:18 EST Ventricular Rate:  86 PR Interval:    QRS Duration: 90 QT Interval:  413 QTC Calculation: 494 R Axis:   81 Text Interpretation: Sinus rhythm Consider left ventricular hypertrophy Borderline prolonged QT interval Confirmed by Quintella Reichert [phone]) on 07/30/2020 8:49:34 PM   Radiology DG Chest 2 View  Result Date: 07/30/2020 CLINICAL DATA:  Chest pain EXAM: CHEST - 2 VIEW COMPARISON:  10/21/2017 FINDINGS: The heart size and mediastinal contours are within normal limits. Both lungs are clear. The visualized skeletal structures are unremarkable. IMPRESSION: No active cardiopulmonary disease. Electronically Signed   By: Donavan Foil M.D.   On: 07/30/2020 18:29    Procedures Procedures (including critical care time)  Medications Ordered in ED Medications  sodium chloride 0.9 % bolus 1,000 mL (1,000 mLs Intravenous New Bag/Given 07/30/20 2200)    ED Course  I have reviewed the triage vital signs and the nursing notes.  Pertinent labs & imaging results that were available during my care of the patient were reviewed by me and considered in my medical decision making (see chart for details).    MDM Rules/Calculators/A&P                         Patient here for 4 days of chest pain.  Also with multiple various complaints though appear to be chronic in nature and followed closely by PCP.  Chest pain is reproducible on palpation.  She states it is also worse with movement.  Patient has been taking Megace over the last month to induce appetite though this does contain estrogens.  Cannot rule out PE as source of patient's chest pain with ongoing shortness of breath.  She does appear anxious labs obtained in triage show mild elevation in creatinine 1.2.   Anion gap is 20, though this is similar to prior labs.  No leukocytosis or anemia.  Troponins x2 are flat and within normal limits.  EKG is nonischemic.  Chest x-ray is clear.  Unlikely ACS with minimal risk factor and negative work-up.  D-dimer is ordered and is elevated to 1.15.  CTA of the chest is ordered to evaluate for PE.  Care assumed at shift change by Baird Cancer PA-C to follow CT results.  If negative, consider discharge with close PCP follow-up.  Final Clinical Impression(s) / ED Diagnoses Final diagnoses:  None    Rx / DC Orders ED Discharge Orders    None       Keiley Levey, [place] N, PA-C 07/30/20 2344    Quintella Reichert, MD 07/31/20 1439

## 2020-07-31 NOTE — ED Notes (Signed)
E-signature pad unavailable at time of pt discharge. This RN discussed discharge materials with pt and answered all pt questions. Pt stated understanding of discharge material. ? ?

## 2020-07-31 NOTE — Discharge Instructions (Addendum)
Your cardiac work-up today was normal.  CT scan was also normal.   Please follow-up with your primary care doctor to discuss other concerns. Return here for new concerns.

## 2020-12-05 ENCOUNTER — Emergency Department (HOSPITAL_COMMUNITY): Payer: Medicaid Other

## 2020-12-05 ENCOUNTER — Emergency Department (HOSPITAL_COMMUNITY)
Admission: EM | Admit: 2020-12-05 | Discharge: 2020-12-06 | Disposition: A | Discharge status: Home / Self Care | Payer: Medicaid Other | Attending: Emergency Medicine | Admitting: Emergency Medicine

## 2020-12-05 ENCOUNTER — Other Ambulatory Visit: Payer: Self-pay

## 2020-12-05 ENCOUNTER — Encounter (HOSPITAL_COMMUNITY): Payer: Self-pay | Admitting: Emergency Medicine

## 2020-12-05 DIAGNOSIS — F17210 Nicotine dependence, cigarettes, uncomplicated: Secondary | ICD-10-CM | POA: Diagnosis not present

## 2020-12-05 DIAGNOSIS — F10129 Alcohol abuse with intoxication, unspecified: Secondary | ICD-10-CM | POA: Diagnosis not present

## 2020-12-05 DIAGNOSIS — R531 Weakness: Secondary | ICD-10-CM | POA: Insufficient documentation

## 2020-12-05 DIAGNOSIS — R1110 Vomiting, unspecified: Secondary | ICD-10-CM | POA: Insufficient documentation

## 2020-12-05 DIAGNOSIS — F10920 Alcohol use, unspecified with intoxication, uncomplicated: Secondary | ICD-10-CM

## 2020-12-05 DIAGNOSIS — R000 Tachycardia, unspecified: Secondary | ICD-10-CM | POA: Diagnosis not present

## 2020-12-05 DIAGNOSIS — R519 Headache, unspecified: Secondary | ICD-10-CM | POA: Diagnosis not present

## 2020-12-05 DIAGNOSIS — Y906 Blood alcohol level of 120-199 mg/100 ml: Secondary | ICD-10-CM | POA: Insufficient documentation

## 2020-12-05 DIAGNOSIS — E876 Hypokalemia: Secondary | ICD-10-CM | POA: Diagnosis not present

## 2020-12-05 HISTORY — DX: Malignant neoplasm of connective and soft tissue, unspecified: C49.9

## 2020-12-05 LAB — LIPASE, BLOOD: Lipase: 35 U/L (ref 11–51)

## 2020-12-05 LAB — COMPREHENSIVE METABOLIC PANEL
ALT: 21 U/L (ref 0–44)
AST: 56 U/L — ABNORMAL HIGH (ref 15–41)
Albumin: 4.2 g/dL (ref 3.5–5.0)
Alkaline Phosphatase: 55 U/L (ref 38–126)
Anion gap: 10 (ref 5–15)
BUN: 10 mg/dL (ref 6–20)
CO2: 26 mmol/L (ref 22–32)
Calcium: 9.1 mg/dL (ref 8.9–10.3)
Chloride: 104 mmol/L (ref 98–111)
Creatinine, Ser: 0.71 mg/dL (ref 0.44–1.00)
GFR, Estimated: >60 mL/min (ref >60)
Glucose, Bld: 137 mg/dL — ABNORMAL HIGH (ref 70–99)
Potassium: 3.2 mmol/L — ABNORMAL LOW (ref 3.5–5.1)
Sodium: 140 mmol/L (ref 135–145)
Total Bilirubin: 0.8 mg/dL (ref 0.3–1.2)
Total Protein: 7.6 g/dL (ref 6.5–8.1)

## 2020-12-05 LAB — CBC WITH DIFFERENTIAL/PLATELET
Abs Immature Granulocytes: 0.03 10*3/uL (ref 0.00–0.07)
Basophils Absolute: 0.1 10*3/uL (ref 0.0–0.1)
Basophils Relative: 1 %
Eosinophils Absolute: 0.3 10*3/uL (ref 0.0–0.5)
Eosinophils Relative: 4 %
HCT: 37.6 % (ref 36.0–46.0)
Hemoglobin: 12.2 g/dL (ref 12.0–15.0)
Immature Granulocytes: 0 %
Lymphocytes Relative: 19 %
Lymphs Abs: 1.5 10*3/uL (ref 0.7–4.0)
MCH: 29.8 pg (ref 26.0–34.0)
MCHC: 32.4 g/dL (ref 30.0–36.0)
MCV: 91.7 fL (ref 80.0–100.0)
Monocytes Absolute: 0.5 10*3/uL (ref 0.1–1.0)
Monocytes Relative: 6 %
Neutro Abs: 5.5 10*3/uL (ref 1.7–7.7)
Neutrophils Relative %: 70 %
Platelets: 214 10*3/uL (ref 150–400)
RBC: 4.1 MIL/uL (ref 3.87–5.11)
RDW: 14.4 % (ref 11.5–15.5)
WBC: 7.8 10*3/uL (ref 4.0–10.5)
nRBC: 0 % (ref 0.0–0.2)

## 2020-12-05 LAB — SEDIMENTATION RATE: Sed Rate: 3 mm/hr (ref 0–22)

## 2020-12-05 LAB — ETHANOL: Alcohol, Ethyl (B): 172 mg/dL — ABNORMAL HIGH (ref <10)

## 2020-12-05 LAB — C-REACTIVE PROTEIN: CRP: 0.5 mg/dL (ref <1.0)

## 2020-12-05 IMAGING — DX DG CHEST 1V PORT
1 series · 1 of 1 positions shown · non-contrast
Comparison: [DATE]

CLINICAL DATA: Weakness

EXAM:
PORTABLE CHEST 1 VIEW

[chest]
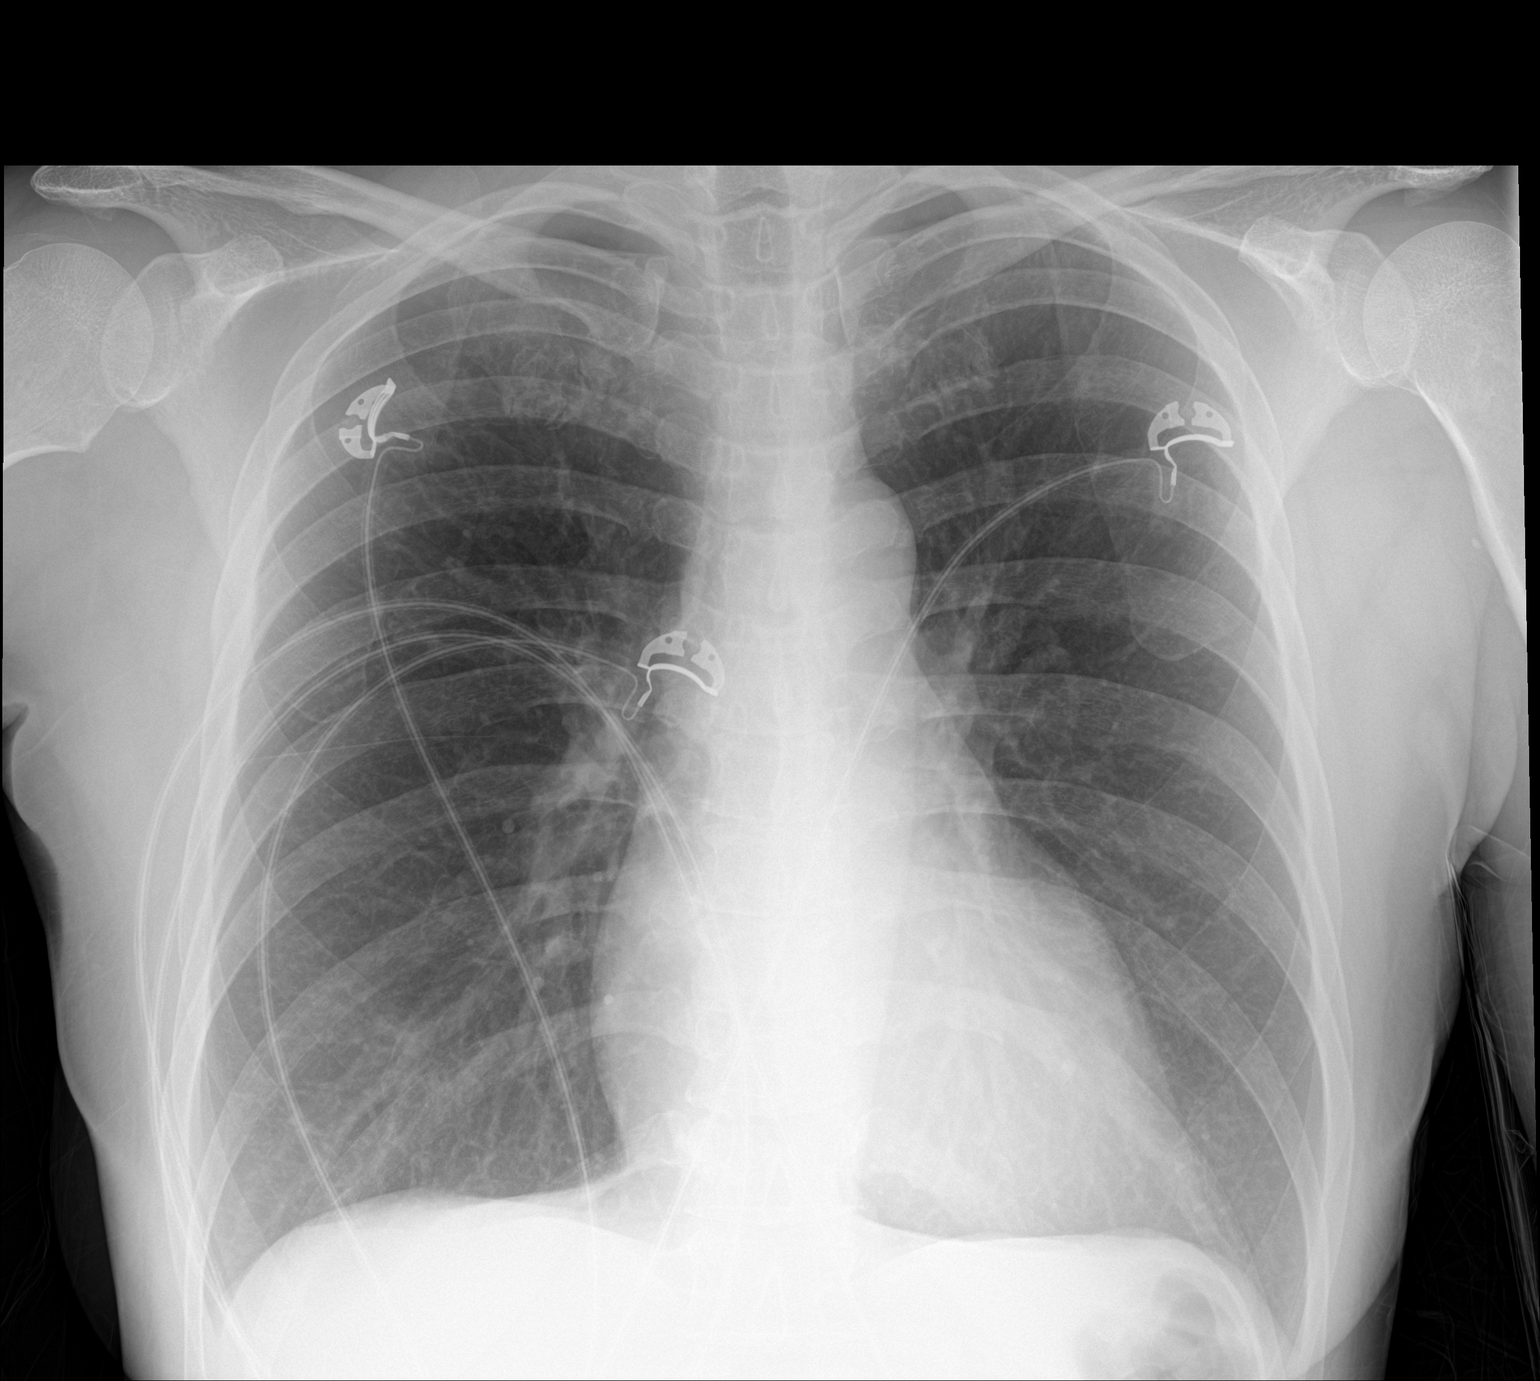

[1 of 1 positions shown; findings below may reference images not displayed]

FINDINGS: The heart size and mediastinal contours are within normal limits.
Both lungs are clear. The visualized skeletal structures are
unremarkable.
IMPRESSION: No active disease.

## 2020-12-05 IMAGING — MR MR HEAD W/O CM
12 of 13 series · 44 of 48 positions shown · IV contrast (gadavist)
Comparison: Prior CT from earlier the same day.

CLINICAL DATA: Initial evaluation for acute headache.

EXAM:
MRI HEAD WITHOUT CONTRAST
MRV HEAD WITHOUT AND WITH CONTRAST
TECHNIQUE: Multiplanar, multiecho pulse sequences of the brain and surrounding
structures were obtained without contrast. Angiographic images of
the intracranial venous structures were obtained using MRV technique
without and with intravenous contrast.
CONTRAST:  5mL GADAVIST GADOBUTROL 1 MMOL/ML IV SOLN

[Series 5: DWI · axial · 3.0mm · 0.88mm/px · z∈[-107,+32]mm · 8 of 100 slices shown (1 of 4)]
[im 1/100]
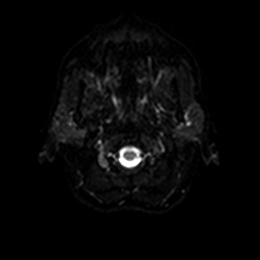
[im 15/100]
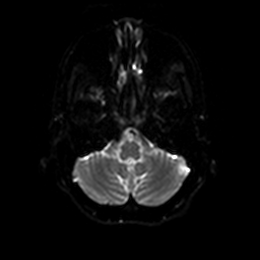
[im 29/100]
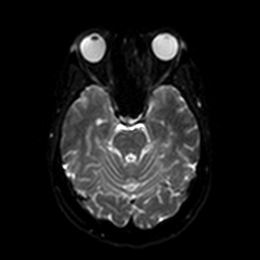
[im 43/100]
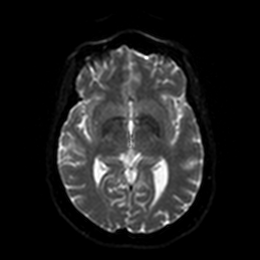
[im 57/100]
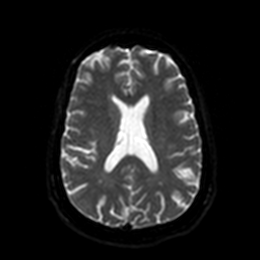
[im 71/100]
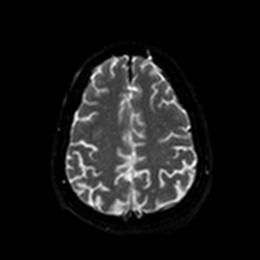
[im 85/100]
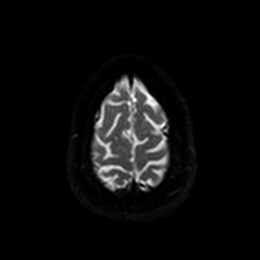
[im 100/100]
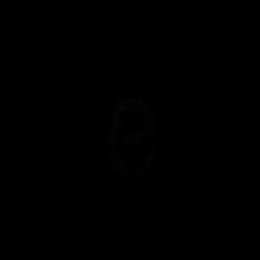

[Series 6: DWI · axial · 3.0mm · 0.88mm/px · z∈[-107,+32]mm · 4 of 50 slices shown (2 of 4)]
[im 1/50]
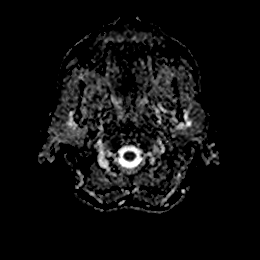
[im 17/50]
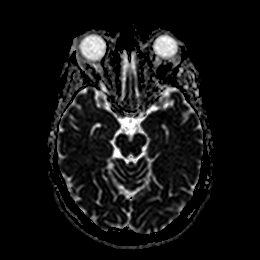
[im 33/50]
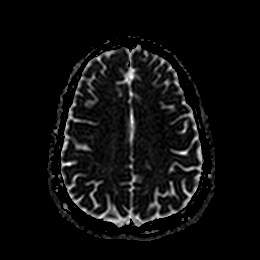
[im 50/50]
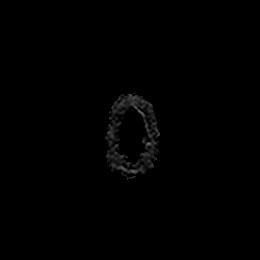

[Series 7: DWI · coronal · 4.0mm · 0.88mm/px · 5 of 70 slices shown (3 of 4)]
[im 1/70]
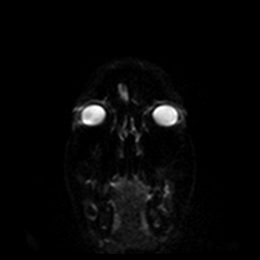
[im 18/70]
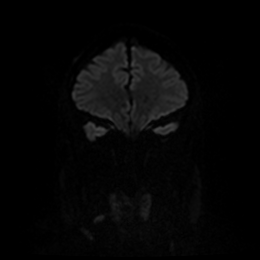
[im 35/70]
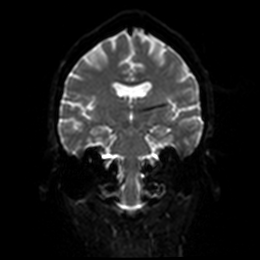
[im 52/70]
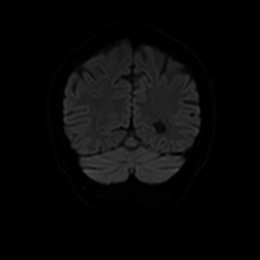
[im 70/70]
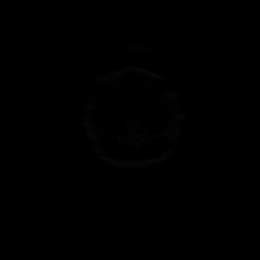

[Series 8: DWI · coronal · 4.0mm · 0.88mm/px · 3 of 35 slices shown (4 of 4)]
[im 1/35]
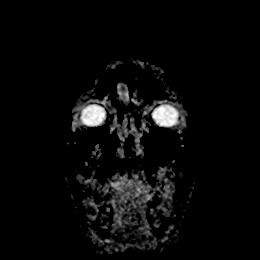
[im 18/35]
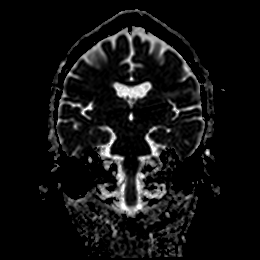
[im 35/35]
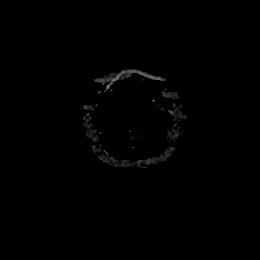

[Series 9: T1 · sagittal · 5.0mm · 0.72mm/px · 2 of 25 slices shown]
[im 1/25]
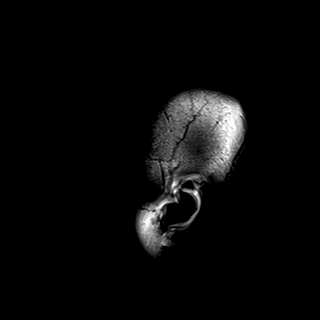
[im 25/25]
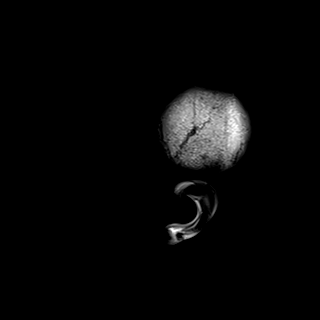

[Series 10: T2 · axial · 5.0mm · 0.72mm/px · z∈[-112,+37]mm · 2 of 27 slices shown (1 of 2)]
[im 1/27]
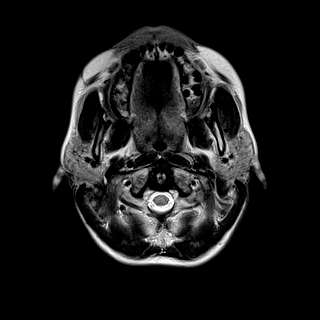
[im 27/27]
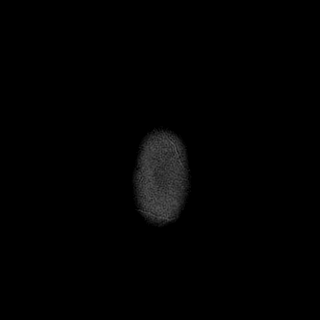

[Series 11: FLAIR · axial · 5.0mm · 0.45mm/px · z∈[-112,+37]mm · 2 of 27 slices shown]
[im 1/27]
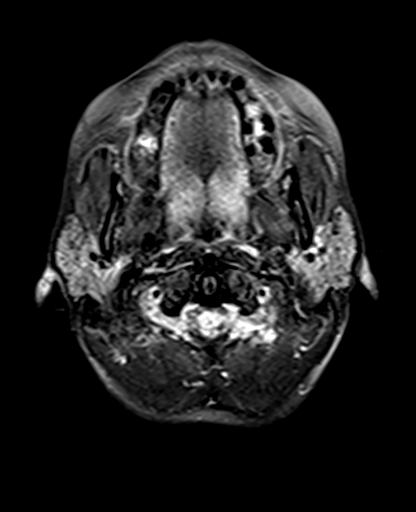
[im 27/27]
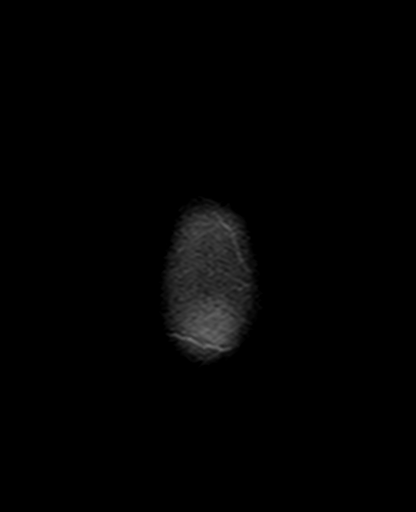

[Series 12: mag_images · axial · 3.0mm · 0.90mm/px · z∈[-116,+42]mm · 4 of 56 slices shown]
[im 1/56]
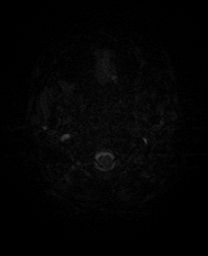
[im 19/56]
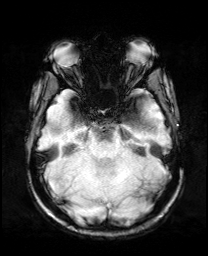
[im 37/56]
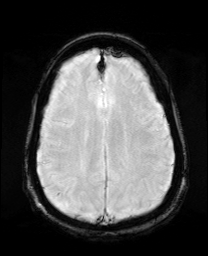
[im 56/56]
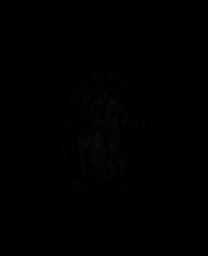

[Series 13: pha_images · axial · 3.0mm · 0.90mm/px · z∈[-116,+42]mm · 4 of 56 slices shown]
[im 1/56]
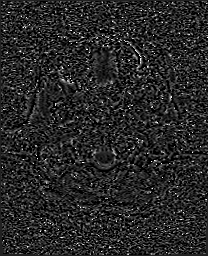
[im 19/56]
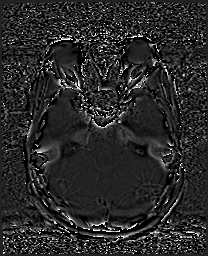
[im 37/56]
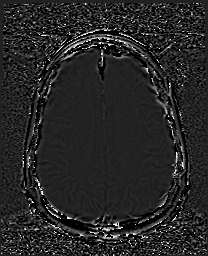
[im 56/56]
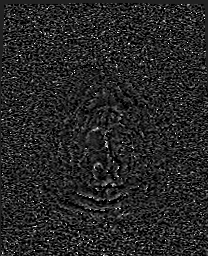

[Series 14: swi_images · axial · 3.0mm · 0.90mm/px · z∈[-116,+42]mm · 4 of 56 slices shown]
[im 1/56]
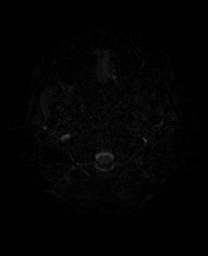
[im 19/56]
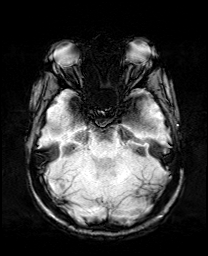
[im 37/56]
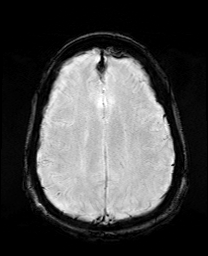
[im 56/56]
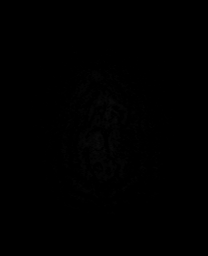

[Series 15: mip_images(sw) · axial · 24.0mm · 0.90mm/px · z∈[-106,+32]mm · 4 of 49 slices shown]
[im 1/49]
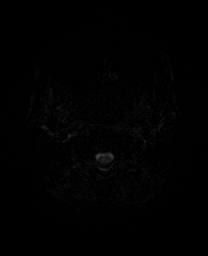
[im 17/49]
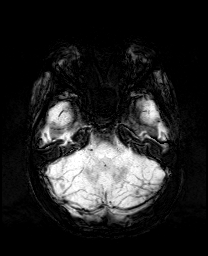
[im 33/49]
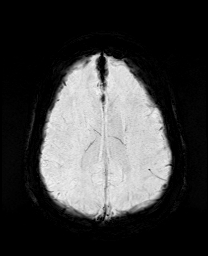
[im 49/49]
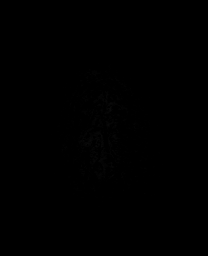

[Series 17: T2 · coronal · 5.0mm · 0.34mm/px · 2 of 30 slices shown (2 of 2)]
[im 1/30]
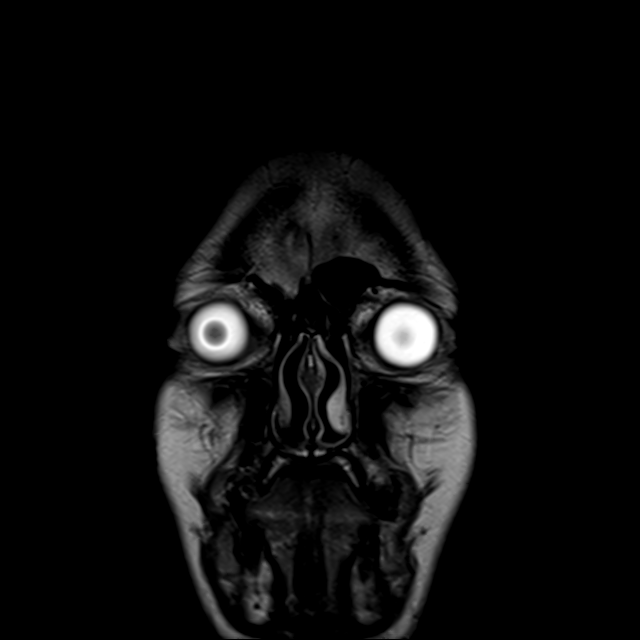
[im 30/30]
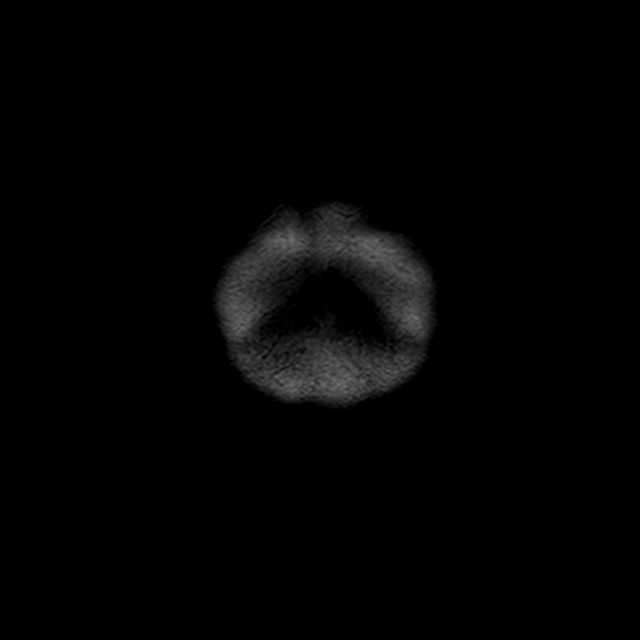

[44 of 48 positions shown; findings below may reference images not displayed]

FINDINGS: MRI HEAD FINDINGS:

Brain: Cerebral volume within normal limits for patient age. No
focal parenchymal signal abnormality identified.

No abnormal foci of restricted diffusion to suggest acute or
subacute ischemia. Gray-white matter differentiation well
maintained. No encephalomalacia to suggest chronic infarction. No
foci of susceptibility artifact to suggest acute or chronic
intracranial hemorrhage.

No mass lesion, midline shift or mass effect. No hydrocephalus. No
extra-axial fluid collection. Major dural sinuses are grossly
patent.

Pituitary gland and suprasellar region are normal. Midline
structures intact and normal.

Vascular: Major intracranial vascular flow voids well maintained and
normal in appearance.

Skull and upper cervical spine: Craniocervical junction normal.
Visualized upper cervical spine within normal limits. Bone marrow
signal intensity normal. No scalp soft tissue abnormality.

Sinuses/Orbits: Globes and orbital soft tissues within normal
limits.

Paranasal sinuses are clear. No mastoid effusion. Inner ear
structures normal.

Other: None.

MRV HEAD FINDINGS

Normal flow related signal and enhancement seen throughout the
superior sagittal sinus to the torcula. Torcula itself is patent.
Transverse and sigmoid sinuses appear patent as do the visualized
proximal internal jugular veins. Right transverse sinus dominant.
Straight sinus, vein of DEEQA RAYAAN, and internal cerebral veins are
patent. No abnormality about the cavernous sinus. No evidence for
dural sinus thrombosis. No appreciable dural sinus stenosis. No
other visible pathologic enhancement within the brain.
IMPRESSION: 1. Normal brain MRI. No acute intracranial abnormality identified.
2. Normal intracranial MRV.  No evidence for dural sinus thrombosis.

## 2020-12-05 IMAGING — MR MR MRV HEAD WO/W CM
4 series · 35 of 48 positions shown · IV contrast (5ML GADAVIST)
Comparison: Prior CT from earlier the same day.

CLINICAL DATA: Initial evaluation for acute headache.

EXAM:
MRI HEAD WITHOUT CONTRAST
MRV HEAD WITHOUT AND WITH CONTRAST
TECHNIQUE: Multiplanar, multiecho pulse sequences of the brain and surrounding
structures were obtained without contrast. Angiographic images of
the intracranial venous structures were obtained using MRV technique
without and with intravenous contrast.
CONTRAST:  5mL GADAVIST GADOBUTROL 1 MMOL/ML IV SOLN

[Series 1: tof_fl2d_paracor · coronal · 2.0mm · 0.98mm/px · 9 of 155 slices shown]
[im 1/155]
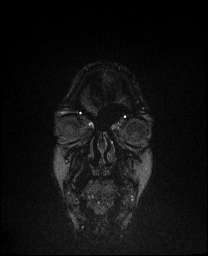
[im 23/155]
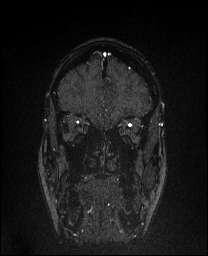
[im 45/155]
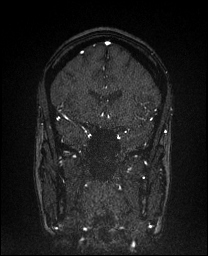
[im 67/155]
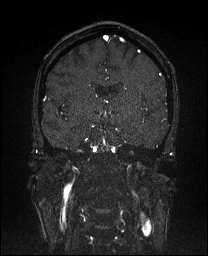
[im 78/155]
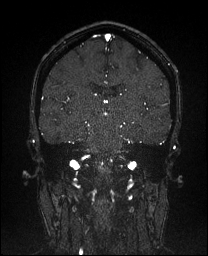
[im 89/155]
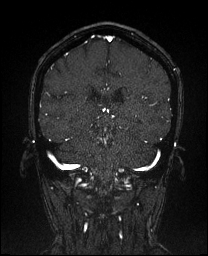
[im 111/155]
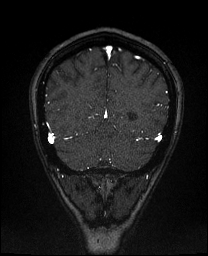
[im 133/155]
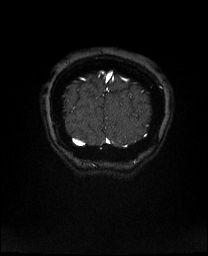
[im 155/155]
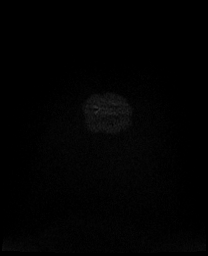

[Series 5: t1_fl3d_sag_p2_iso · sagittal · 1.0mm · 0.98mm/px · 9 of 160 slices shown]
[im 1/160]
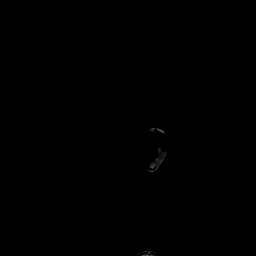
[im 23/160]
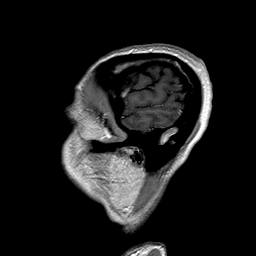
[im 46/160]
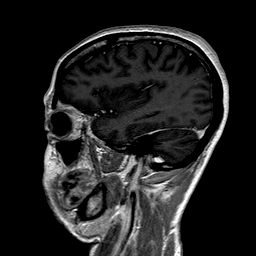
[im 69/160]
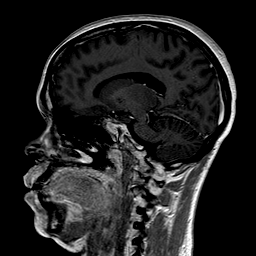
[im 80/160]
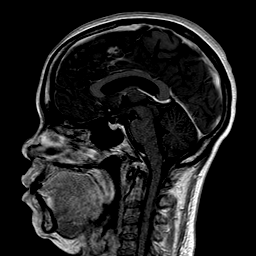
[im 91/160]
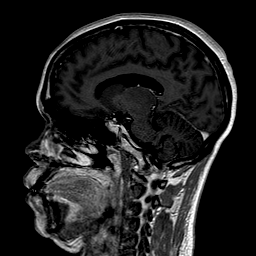
[im 114/160]
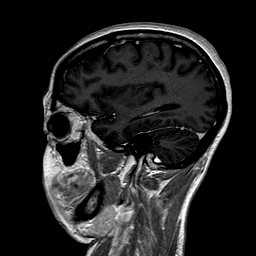
[im 137/160]
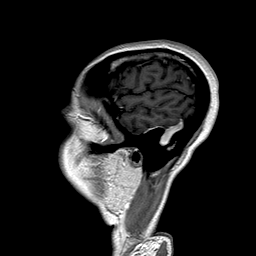
[im 160/160]
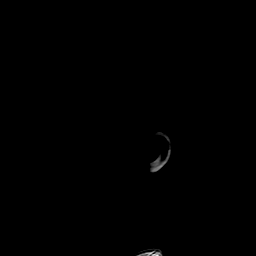

[Series 6: t1_fl3d_sag_p2_iso_mpr_ axial · axial · 2.0mm · 0.42mm/px · z∈[-136,+45]mm · 9 of 95 slices shown]
[im 1/95]
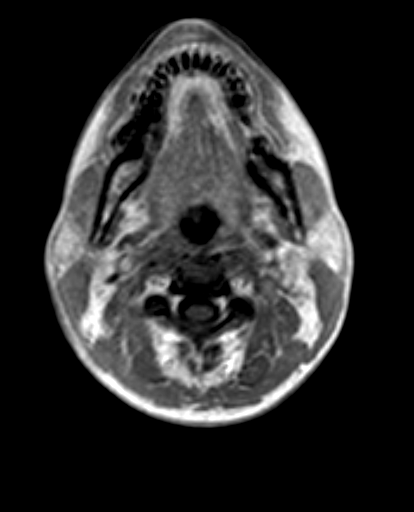
[im 12/95]
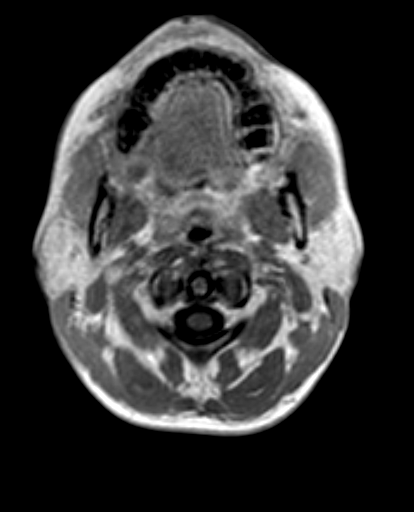
[im 24/95]
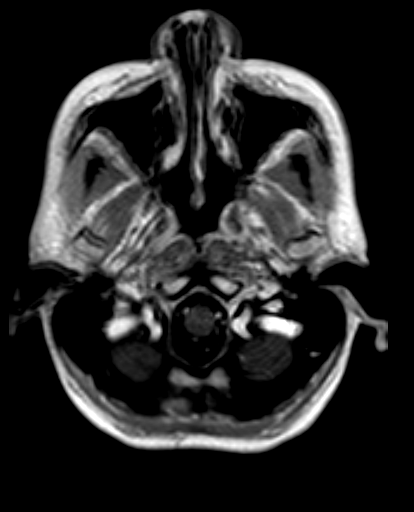
[im 36/95]
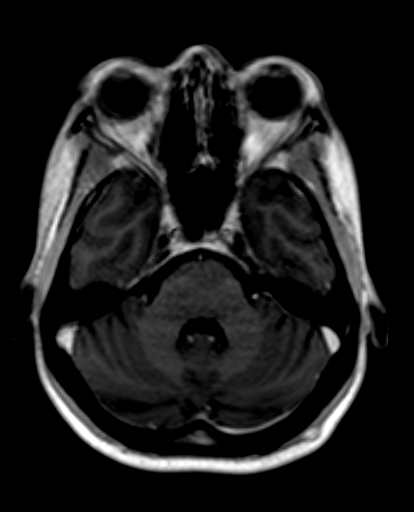
[im 48/95]
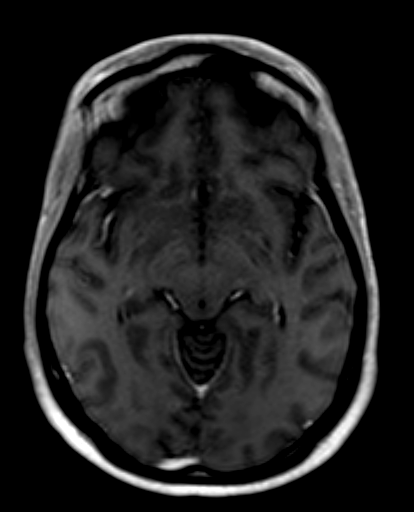
[im 59/95]
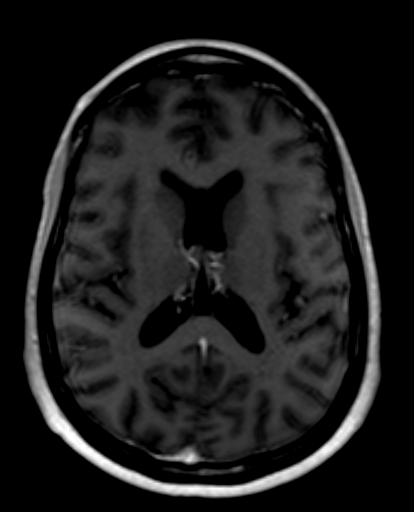
[im 71/95]
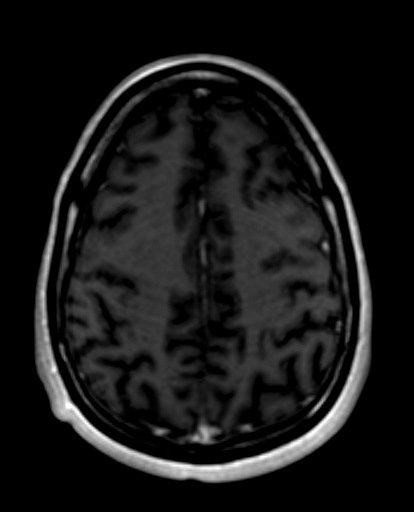
[im 83/95]
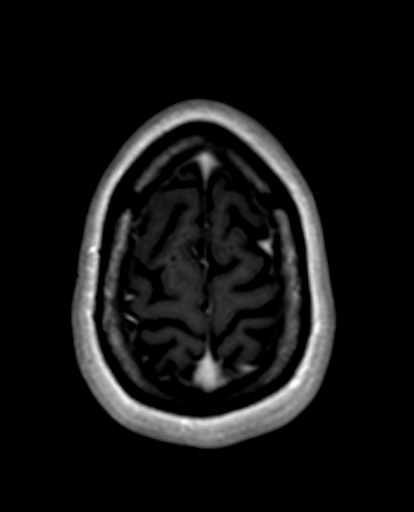
[im 95/95]
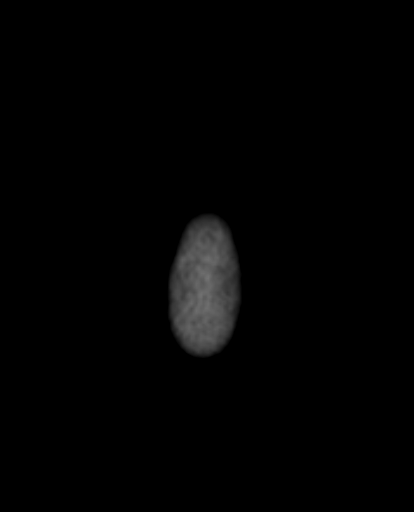

[Series 7: t1_fl3d_sag_p2_iso_mpr_coronal · coronal · 2.0mm · 0.45mm/px · 8 of 94 slices shown]
[im 1/94]
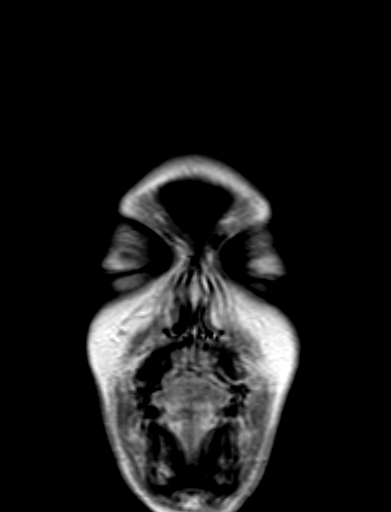
[im 12/94]
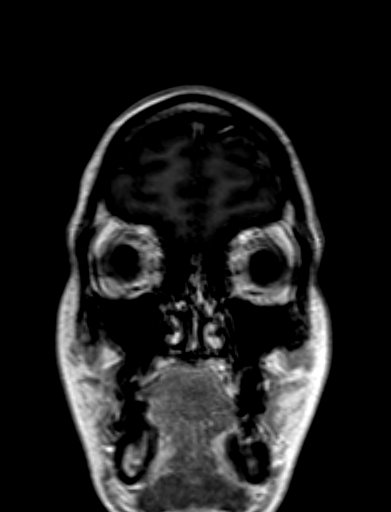
[im 24/94]
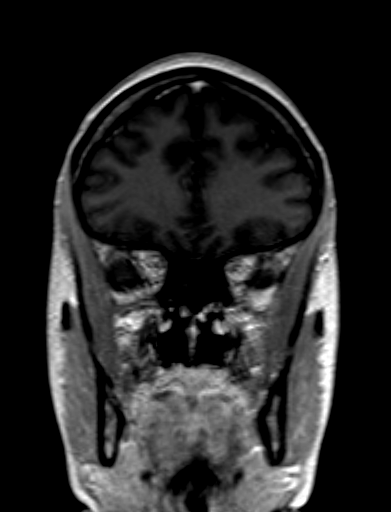
[im 35/94]
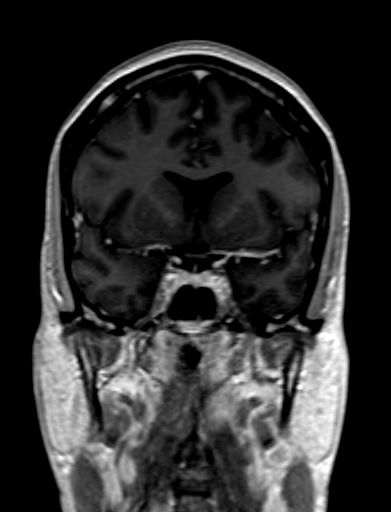
[im 47/94]
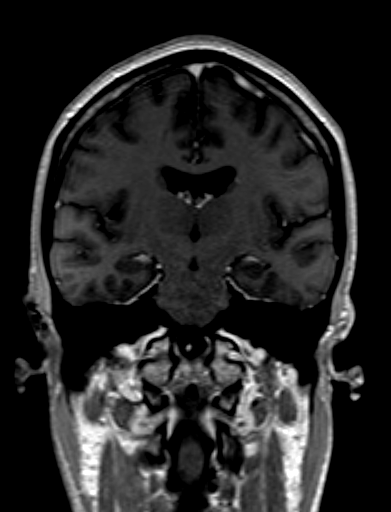
[im 59/94]
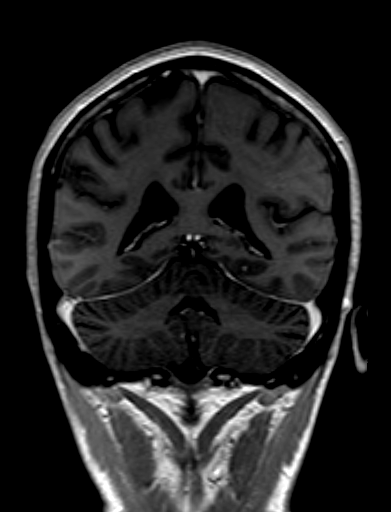
[im 70/94]
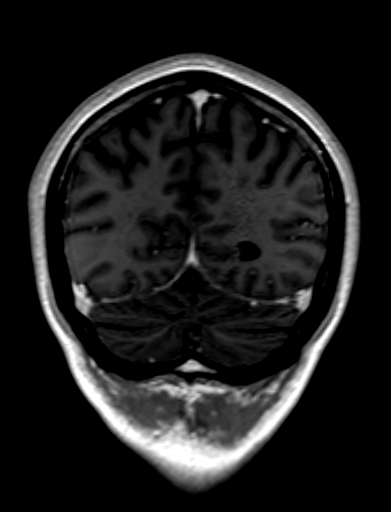
[im 82/94]
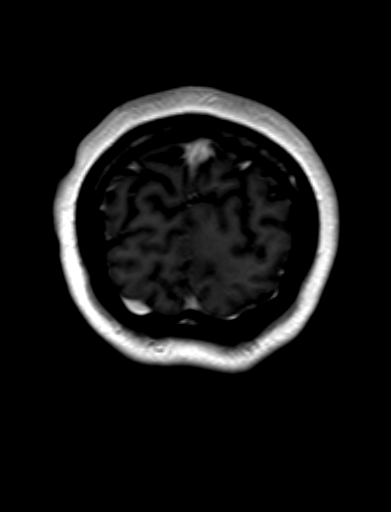

[35 of 48 positions shown; findings below may reference images not displayed]

FINDINGS: MRI HEAD FINDINGS:

Brain: Cerebral volume within normal limits for patient age. No
focal parenchymal signal abnormality identified.

No abnormal foci of restricted diffusion to suggest acute or
subacute ischemia. Gray-white matter differentiation well
maintained. No encephalomalacia to suggest chronic infarction. No
foci of susceptibility artifact to suggest acute or chronic
intracranial hemorrhage.

No mass lesion, midline shift or mass effect. No hydrocephalus. No
extra-axial fluid collection. Major dural sinuses are grossly
patent.

Pituitary gland and suprasellar region are normal. Midline
structures intact and normal.

Vascular: Major intracranial vascular flow voids well maintained and
normal in appearance.

Skull and upper cervical spine: Craniocervical junction normal.
Visualized upper cervical spine within normal limits. Bone marrow
signal intensity normal. No scalp soft tissue abnormality.

Sinuses/Orbits: Globes and orbital soft tissues within normal
limits.

Paranasal sinuses are clear. No mastoid effusion. Inner ear
structures normal.

Other: None.

MRV HEAD FINDINGS

Normal flow related signal and enhancement seen throughout the
superior sagittal sinus to the torcula. Torcula itself is patent.
Transverse and sigmoid sinuses appear patent as do the visualized
proximal internal jugular veins. Right transverse sinus dominant.
Straight sinus, vein of DEEQA RAYAAN, and internal cerebral veins are
patent. No abnormality about the cavernous sinus. No evidence for
dural sinus thrombosis. No appreciable dural sinus stenosis. No
other visible pathologic enhancement within the brain.
IMPRESSION: 1. Normal brain MRI. No acute intracranial abnormality identified.
2. Normal intracranial MRV.  No evidence for dural sinus thrombosis.

## 2020-12-05 IMAGING — CT CT HEAD W/O CM
4 series · 16 of 47 positions shown, 18 images · non-contrast
Comparison: None.

CLINICAL DATA: 44-year-old female with headache.

EXAM:
CT HEAD WITHOUT CONTRAST
TECHNIQUE: Contiguous axial images were obtained from the base of the skull
through the vertex without intravenous contrast.

[Series 3: head wo · axial · 0.45mm/px · z∈[-24,+86]mm · 7 of 30 slices shown, 9 images]
[im 4/30  brain]
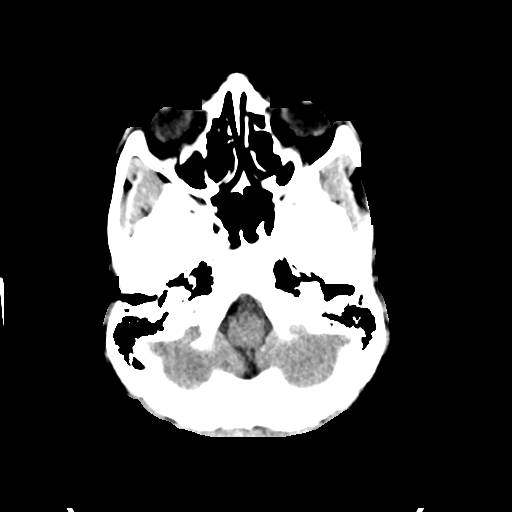
[im 4/30  bone]
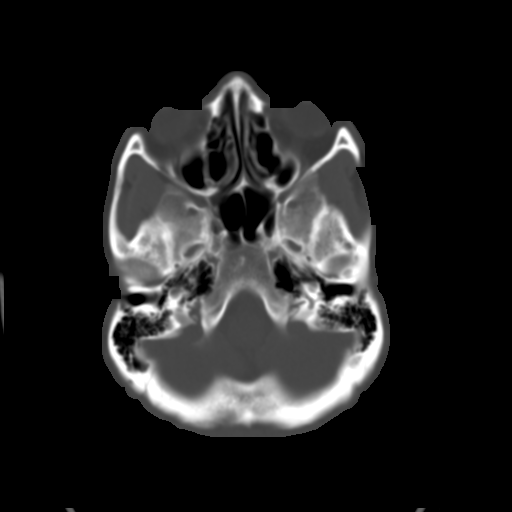
[im 8/30  brain]
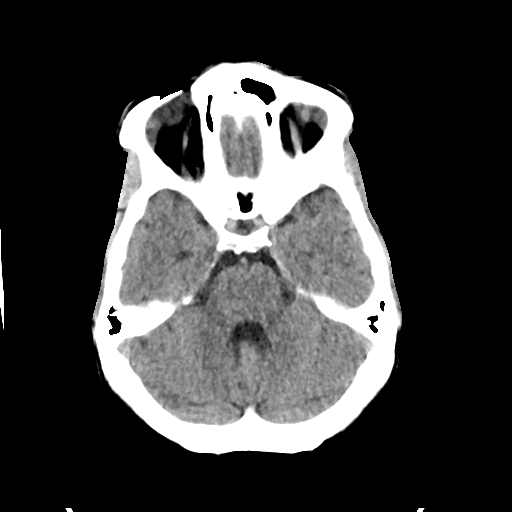
[im 11/30  brain]
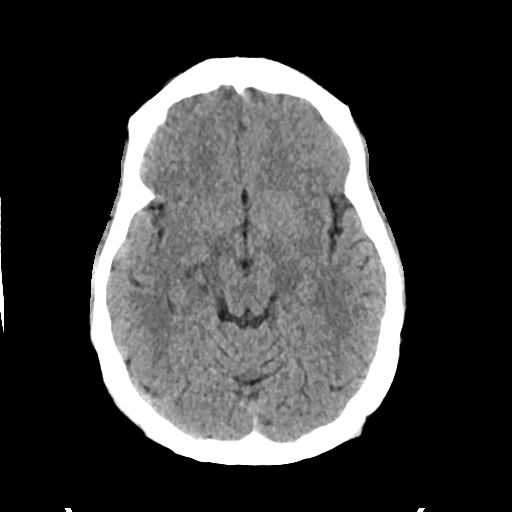
[im 15/30  brain]
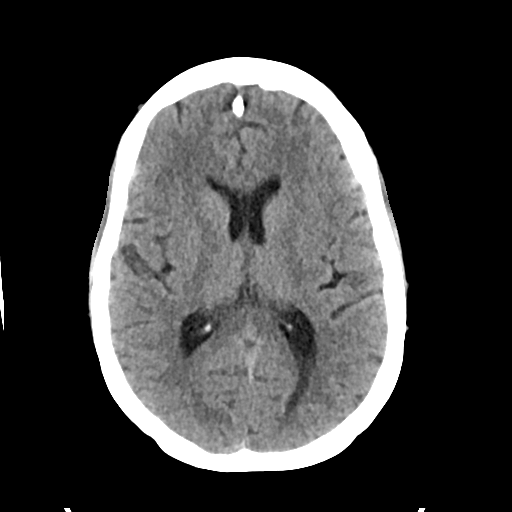
[im 19/30  brain]
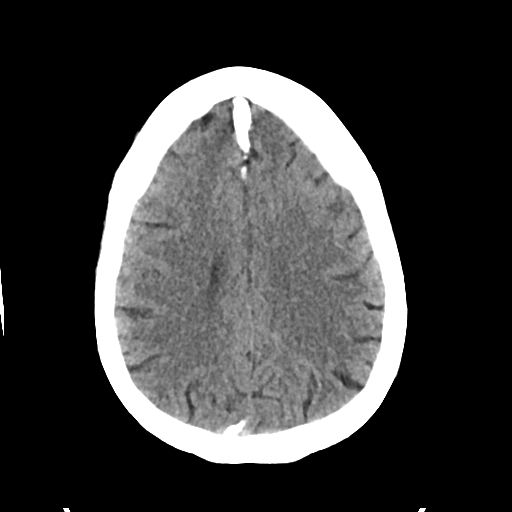
[im 19/30  bone]
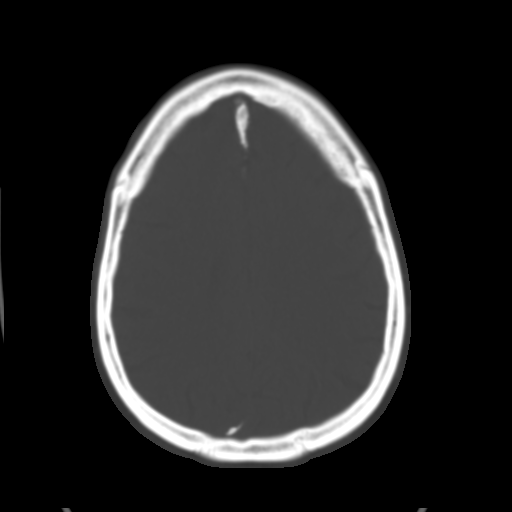
[im 22/30  brain]
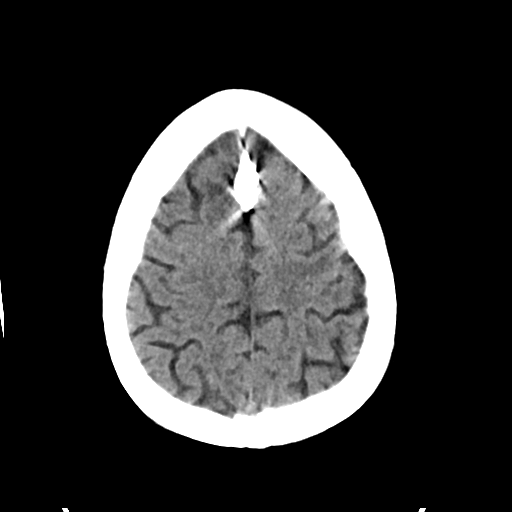
[im 26/30  brain]
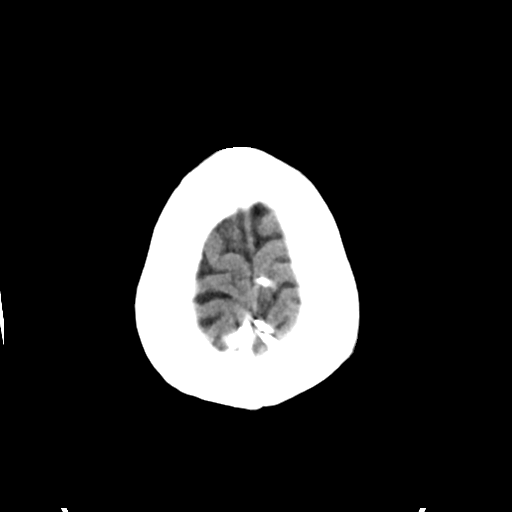

[Series 4: head bone · axial · 0.45mm/px · z∈[-25,+3]mm · 3 of 73 slices shown]
[im 8/73  bone]
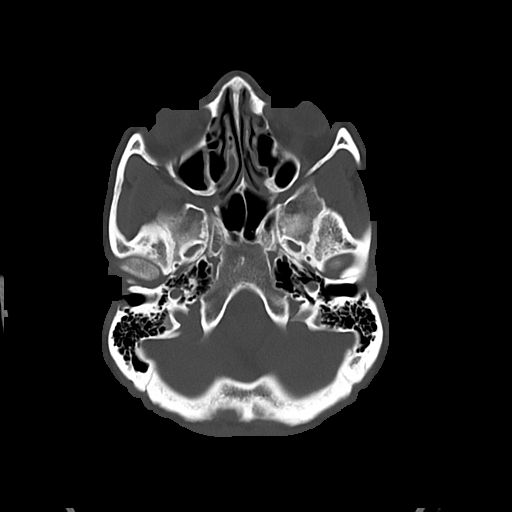
[im 15/73  bone]
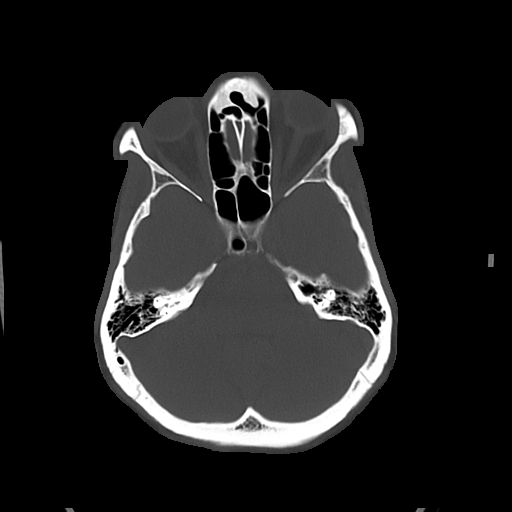
[im 22/73  bone]
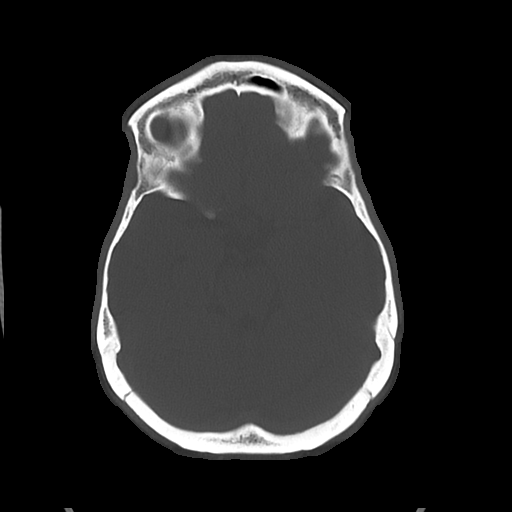

[Series 5: cor soft · coronal · 0.32mm/px · 3 of 72 slices shown]
[im 24/72  brain]
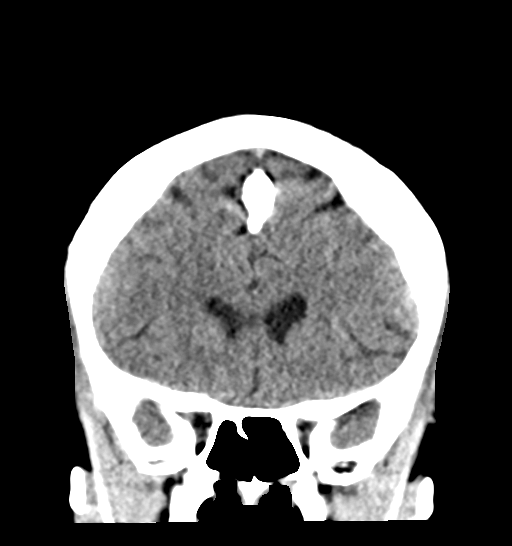
[im 32/72  brain]
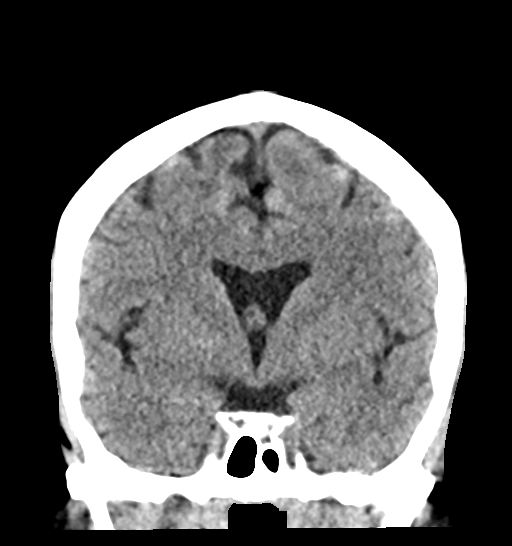
[im 40/72  brain]
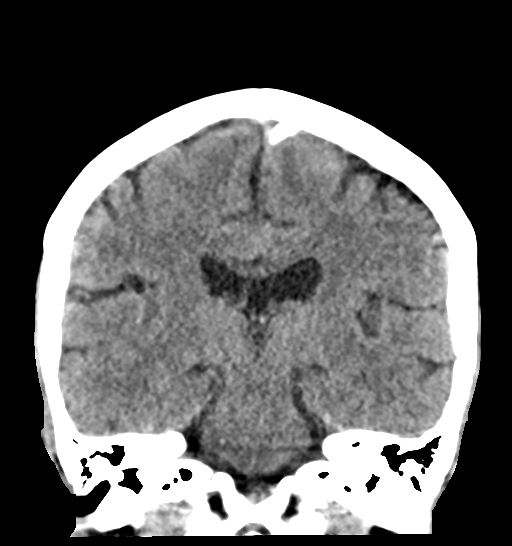

[Series 6: sag soft · sagittal · 0.32mm/px · 3 of 57 slices shown]
[im 19/57  brain]
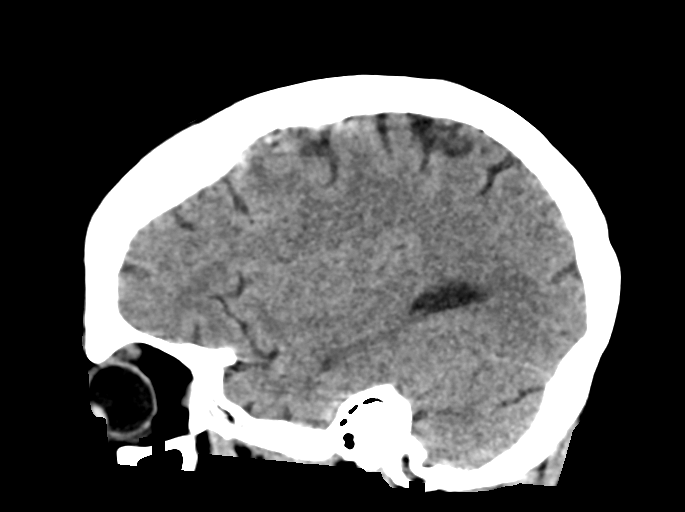
[im 29/57  brain]
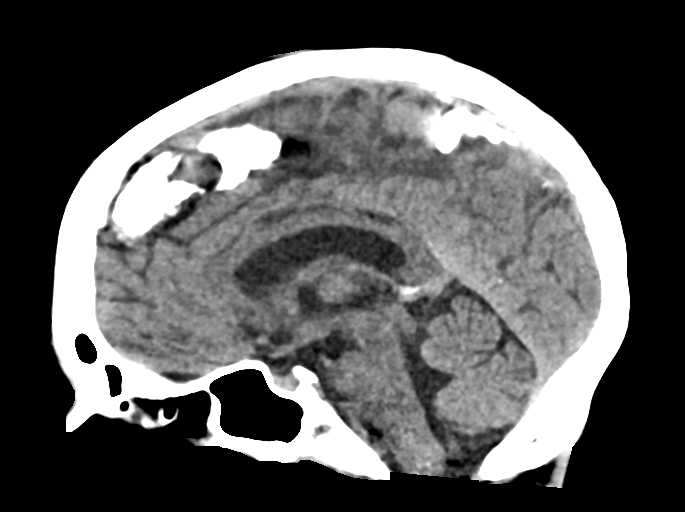
[im 38/57  brain]
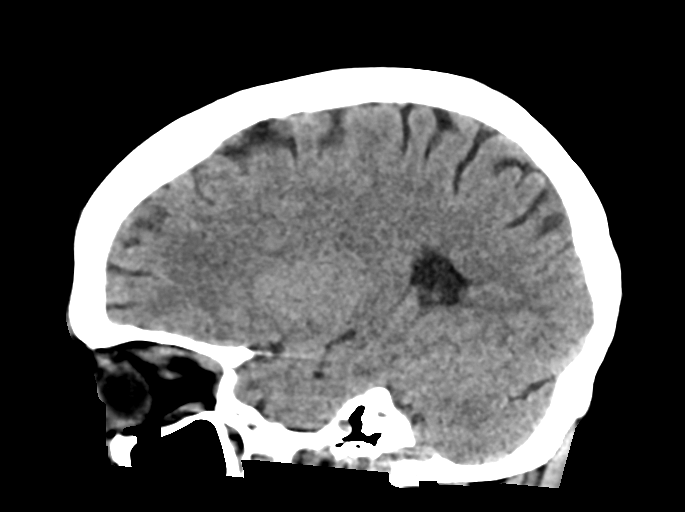

[16 of 47 positions shown; findings below may reference images not displayed]

FINDINGS: Evaluation of this exam is limited due to motion artifact.

Brain: The ventricles and sulci are appropriate size for patient's
age. The gray-white matter discrimination is preserved. There is no
acute intracranial hemorrhage. No mass effect or midline shift. No
extra-axial fluid collection. Diffuse calcification of the falx.

Vascular: No hyperdense vessel or unexpected calcification.

Skull: Normal. Negative for fracture or focal lesion.

Sinuses/Orbits: Mild mucoperiosteal thickening paranasal sinuses.
The mastoid air cells are clear.

Other: None
IMPRESSION: Unremarkable noncontrast CT of the brain.

## 2020-12-05 MED ORDER — METOCLOPRAMIDE HCL 5 MG/ML IJ SOLN
10.0000 mg | Freq: Once | INTRAMUSCULAR | Status: AC
  Administered 2020-12-05: 10 mg via INTRAVENOUS
  Filled 2020-12-05: qty 2

## 2020-12-05 MED ORDER — LORAZEPAM 2 MG/ML IJ SOLN
1.0000 mg | Freq: Once | INTRAMUSCULAR | Status: AC
  Administered 2020-12-05: 1 mg via INTRAVENOUS
  Filled 2020-12-05: qty 1

## 2020-12-05 MED ORDER — DEXAMETHASONE SODIUM PHOSPHATE 10 MG/ML IJ SOLN
10.0000 mg | Freq: Once | INTRAMUSCULAR | Status: AC
  Administered 2020-12-05: 10 mg via INTRAVENOUS
  Filled 2020-12-05: qty 1

## 2020-12-05 MED ORDER — ACETAMINOPHEN 325 MG PO TABS
650.0000 mg | ORAL_TABLET | Freq: Once | ORAL | Status: AC
  Administered 2020-12-05: 650 mg via ORAL
  Filled 2020-12-05: qty 2

## 2020-12-05 MED ORDER — LACTATED RINGERS IV BOLUS
1000.0000 mL | Freq: Once | INTRAVENOUS | Status: AC
  Administered 2020-12-05: 1000 mL via INTRAVENOUS

## 2020-12-05 MED ORDER — KETOROLAC TROMETHAMINE 15 MG/ML IJ SOLN
15.0000 mg | Freq: Once | INTRAMUSCULAR | Status: AC
  Administered 2020-12-05: 15 mg via INTRAVENOUS
  Filled 2020-12-05: qty 1

## 2020-12-05 MED ORDER — GADOBUTROL 1 MMOL/ML IV SOLN
5.0000 mL | Freq: Once | INTRAVENOUS | Status: AC | PRN
  Administered 2020-12-05: 5 mL via INTRAVENOUS

## 2020-12-05 NOTE — ED Provider Notes (Signed)
Emergency Medicine Provider Triage Evaluation Note  [name] , a 45 y.o. female  was evaluated in triage.  Pt complains of Pt complains of headache right-sided, temporal area onset yesterday.  Gradual in onset.  No head injury.  Not on anticoagulation.  She does feel like she has more blurry vision that is intermittent out of the right eye.  Has also had associated nausea and vomiting, however this has been ongoing before the headache.  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  She has taken Advil this morning with little relief.   Review of Systems  Positive: As above Negative: As above  Physical Exam  There were no vitals taken for this visit. Gen:   Awake, no distress   Resp:  Normal effort  MSK:   Moves extremities without difficulty  Other:  CN 2-12 intact, vision grossly intact, moving all 4 extremities without difficulty, anxious and tearful   Medical Decision Making  Medically screening exam initiated at 4:21 PM.  Appropriate orders placed.  [name] was informed that the remainder of the evaluation will be completed by another provider, this initial triage assessment does not replace that evaluation, and the importance of remaining in the ED until their evaluation is complete.  Encourage nursing staff to expedite triage process   Lyndel Safe 12/05/20 1622    Hayden Rasmussen, MD 12/06/20 1018

## 2020-12-05 NOTE — ED Triage Notes (Signed)
Pt to triage via [facility] from home.  Reports R temporal headache since yesterday with nausea.  Blurred vision since waking up this morning.  No arm drift.  Recently diagnosed with sarcoma.

## 2020-12-05 NOTE — ED Provider Notes (Signed)
[facility] EMERGENCY DEPARTMENT Provider Note   CSN: [id number] Arrival date & time: 12/05/20  1616     History No chief complaint on file.   [name] is a 45 y.o. female.  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  Patient is not able to verbalize how long she has had a headache but states it is worse today.  In the right temple area causing blurry vision in both eyes and weakness on her left side.  She reports her leg gave out today but again is on clear when all of her symptoms started.  Patient has had some vomiting and light does seem to make the headache worse.  She reports she does not usually get headaches.  She has not had any cough, shortness of breath or chest pain.  Patient is intermittently tearful and emotional on exam.  She intermittently reports she cannot remember things.  She denies taking any medications currently.  She denies any unusual sensations in her face or upper extremities.  Feel member reports she has been seeing her doctor by virtual visit but is not sure when the last time she saw them in person.  The history is provided by the patient and a relative.       Past Medical History:  Diagnosis Date  . History of cervical dysplasia    20 yrs ago   . PTSD (post-traumatic stress disorder)   . 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   Past Surgical History:  Procedure Laterality Date  . APPENDECTOMY    . CESAREAN SECTION       OB History    Gravida  4   Para  3   Term  3   Preterm      AB  1   Living  3     SAB      IAB      Ectopic      Multiple      Live Births  4           Family History  Problem Relation Age of Onset  . Hypertension Mother   . Breast  cancer Mother   . Diabetes Father   . Hypertension Father   . Breast cancer Paternal Grandmother     Social History   Tobacco Use  . Smoking status: Current Some Day Smoker    Types: Cigarettes  . Smokeless tobacco: Never Used  Vaping Use  . Vaping Use: Never used  Substance Use Topics  . Alcohol use: Yes    Comment: occ.  . Drug use: No    Home Medications Prior to Admission medications   Medication Sig Start Date End Date Taking? Authorizing Provider  busPIRone (BUSPAR) 10 MG tablet Take 10 mg by mouth 2 (two) times a day. 11/25/18   [provider]  escitalopram (LEXAPRO) 20 MG tablet Take 20 mg by mouth daily.    [provider]  HYDROcodone-acetaminophen (NORCO/VICODIN) 5-325 MG tablet Take 2 tablets by mouth every 4 (four) hours as needed. 12/20/18   Nuala Alpha A, PA-C  ibuprofen (ADVIL) 600 MG tablet Take 600 mg by mouth every 6 (six) hours as needed for mild pain.    [provider]  medroxyPROGESTERone (DEPO-PROVERA) 150 MG/ML injection Inject 1 mL (150 mg total) into the muscle every 3 (three) months. Patient not taking: Reported on 12/20/2018 08/17/17   Shelly Bombard, MD  methocarbamol (ROBAXIN) 500 MG tablet Take 1 tablet (500 mg total) by mouth 2 (two) times daily. Patient not taking: Reported on 12/20/2018 10/21/17   Providence Lanius A, PA-C  ondansetron (ZOFRAN ODT) 4 MG disintegrating tablet Take 1 tablet (4 mg total) by mouth every 8 (eight) hours as needed for nausea or vomiting. 12/20/18   Nuala Alpha A, PA-C  potassium chloride (K-DUR) 10 MEQ tablet Take 2 tablets (20 mEq total) by mouth daily for 3 days. 12/20/18 12/23/18  Nuala Alpha A, PA-C  predniSONE (DELTASONE) 50 MG tablet Take 1 tablet (50 mg total) by mouth daily. Patient not taking: Reported on 12/20/2018 05/21/18   Ok Edwards, PA-C  traZODone (DESYREL) 50 MG tablet Take 50-100 mg by mouth at bedtime. 11/25/18   [provider]  triamcinolone (KENALOG) 0.025 %  ointment Apply 1 application topically 2 (two) times daily. Patient not taking: Reported on 12/20/2018 05/21/18   Ok Edwards, PA-C  urea (CARMOL) 10 % cream Apply topically as needed. Patient not taking: Reported on 12/20/2018 05/21/18   Ok Edwards, PA-C    Allergies    Patient has no known allergies.  Review of Systems   Review of Systems  All other systems reviewed and are negative.   Physical Exam Updated Vital Signs BP (!) 157/91   Pulse 80   Temp 98.8 F (37.1 C)   Resp 16   SpO2 100%   Physical Exam Vitals and nursing note reviewed.  Constitutional:      General: She is not in acute distress.    Appearance: She is well-developed.     Comments: Intermittently tearful  HENT:     Head: Normocephalic and atraumatic.     Comments: Engorged veins in the right temporal region that are tender with palpation Eyes:     General: No visual field deficit.    Extraocular Movements: Extraocular movements intact.     Conjunctiva/sclera: Conjunctivae normal.     Pupils: Pupils are equal, round, and reactive to light.  Cardiovascular:     Rate and Rhythm: Regular rhythm. Tachycardia present.     Heart sounds: Normal heart sounds. No murmur heard. No friction rub.  Pulmonary:     Effort: Pulmonary effort is normal.     Breath sounds: Normal breath sounds. No wheezing or rales.  Abdominal:     General: Bowel sounds are normal. There is no distension.     Palpations: Abdomen is soft.     Tenderness: There is no abdominal tenderness. There is no guarding or rebound.  Musculoskeletal:        General: No tenderness. Normal range of motion.     Right lower leg: No edema.     Left lower leg: No edema.     Comments: No edema  Skin:    General: Skin is warm and dry.     Findings: No rash.  Neurological:     Mental Status: She is alert.     Cranial Nerves: No cranial nerve deficit.     Comments: Oriented to person and place.  No notable facial droops.  No pronator drift in the upper  extremities.  Pronator drift present in the left lower extremity with 4 out of 5 strength in the left lower extremity.  5 out of 5 strength  in all other extremities.  Sensation is intact throughout.  Extraocular movements are normal without evidence of palsy.  Normal finger-to-nose.  Heel-to-shin is normal on the right lower extremity but difficult to perform with the left lower extremity.  Psychiatric:     Comments: Intermittently tearful and somewhat erratic behavior     ED Results / Procedures / Treatments   Labs (all labs ordered are listed, but only abnormal results are displayed) Labs Reviewed  COMPREHENSIVE METABOLIC PANEL - Abnormal; Notable for the following components:      Result Value   Potassium 3.2 (*)    Glucose, Bld 137 (*)    AST 56 (*)    All other components within normal limits  ETHANOL - Abnormal; Notable for the following components:   Alcohol, Ethyl (B) 172 (*)    All other components within normal limits  CBC WITH DIFFERENTIAL/PLATELET  SEDIMENTATION RATE  C-REACTIVE PROTEIN  LIPASE, BLOOD    EKG None  Radiology CT Head Wo Contrast  Result Date: 12/05/2020 CLINICAL DATA:  45 year old female with headache. EXAM: CT HEAD WITHOUT CONTRAST TECHNIQUE: Contiguous axial images were obtained from the base of the skull through the vertex without intravenous contrast. COMPARISON:  None. FINDINGS: Evaluation of this exam is limited due to motion artifact. Brain: The ventricles and sulci are appropriate size for patient's age. The gray-white matter discrimination is preserved. There is no acute intracranial hemorrhage. No mass effect or midline shift. No extra-axial fluid collection. Diffuse calcification of the falx. Vascular: No hyperdense vessel or unexpected calcification. Skull: Normal. Negative for fracture or focal lesion. Sinuses/Orbits: Mild mucoperiosteal thickening paranasal sinuses. The mastoid air cells are clear. Other: None IMPRESSION: Unremarkable  noncontrast CT of the brain. Electronically Signed   By: Anner Crete M.D.   On: 12/05/2020 17:54    Procedures Procedures   Medications Ordered in ED Medications  dexamethasone (DECADRON) injection 10 mg (has no administration in time range)  metoCLOPramide (REGLAN) injection 10 mg (has no administration in time range)  lactated ringers bolus 1,000 mL (has no administration in time range)  acetaminophen (TYLENOL) tablet 650 mg (650 mg Oral Given 12/05/20 1712)    ED Course  I have reviewed the triage vital signs and the nursing notes.  Pertinent labs & imaging results that were available during my care of the patient were reviewed by me and considered in my medical decision making (see chart for details).    MDM Rules/Calculators/A&P                          45 year old female presenting today with a headache and some neurologic findings.  Unclear exactly when headache started as patient reports that has been a while but worse today.  Patient does have notable weakness in the left lower extremity.  Unclear how long this has been going on.  Patient is intermittently tearful on exam but is able to participate in the exam.  She has things she cannot remember.  History of sarcoma but no prior treatment.  Concern for possible space-occupying lesion with increased intracranial pressure or head bleed, possible stroke but given there is no last seen normal time she does not fit within tPA window.  Patient is hypertensive and tachycardic on exam.  Also concern for possible venous thrombosis.  She reports blurred vision in both eyes but pupils are reactive bilaterally.  No notable visual field cuts.  Patient was given a headache cocktail.  Also will check  labs including alcohol level to ensure that is not part of the cause of her exam findings.  CBC within normal limits, CMP with mild elevated AST at 56 and minimal hypokalemia at 3.2 but normal blood sugar.  Head CT is negative.  Feel patient will  most likely need an MRV for further evaluation to rule out cavernous venous thrombosis as well as stroke.  8:33 PM EtOH is 172 but on re-eval pt is much more lucid and cooperative and still having left leg weakness with right temporal pain and vein engorgement.  Will get MRV/MR to further evaluate.  HA improved but not gone will give toradol. Sed rate wnl and low suspicion for temporal arteritis due to pt's age.    Final Clinical Impression(s) / ED Diagnoses Final diagnoses:  None    Rx / DC Orders ED Discharge Orders    None       Blanchie Dessert, MD 12/05/20 2320

## 2020-12-05 NOTE — ED Notes (Signed)
MRI called this RN stating that patient was "freaking out and needs meds". Maryan Rued, MD notified.

## 2020-12-06 NOTE — ED Notes (Signed)
Patient given discharge instructions. Questions were answered. Patient verbalized understanding of discharge instructions and care at home.  

## 2021-04-07 ENCOUNTER — Emergency Department (HOSPITAL_COMMUNITY)
Admission: EM | Admit: 2021-04-07 | Discharge: 2021-04-07 | Discharge status: Against Medical Advice | Payer: Medicaid Other

## 2021-04-07 NOTE — ED Notes (Signed)
X1 for triage vitals with no response/ [facility] staff states patient just walked out

## 2021-04-12 ENCOUNTER — Other Ambulatory Visit (HOSPITAL_COMMUNITY)
Admission: EM | Admit: 2021-04-12 | Discharge: 2021-04-13 | Disposition: A | Discharge status: Home / Self Care | Payer: Medicaid Other | Source: Home / Self Care | Attending: Psychiatry | Admitting: Psychiatry

## 2021-04-12 ENCOUNTER — Other Ambulatory Visit: Payer: Self-pay

## 2021-04-12 ENCOUNTER — Ambulatory Visit (HOSPITAL_COMMUNITY)
Admission: EM | Admit: 2021-04-12 | Discharge: 2021-04-12 | Disposition: A | Discharge status: Other Acute Inpatient Hospital | Payer: Medicaid Other | Attending: Psychiatry | Admitting: Psychiatry

## 2021-04-12 ENCOUNTER — Emergency Department (HOSPITAL_COMMUNITY)
Admission: EM | Admit: 2021-04-12 | Discharge: 2021-04-12 | Disposition: A | Discharge status: Home / Self Care | Payer: Medicaid Other | Attending: Emergency Medicine | Admitting: Emergency Medicine

## 2021-04-12 DIAGNOSIS — R61 Generalized hyperhidrosis: Secondary | ICD-10-CM | POA: Insufficient documentation

## 2021-04-12 DIAGNOSIS — Z79899 Other long term (current) drug therapy: Secondary | ICD-10-CM | POA: Insufficient documentation

## 2021-04-12 DIAGNOSIS — F17210 Nicotine dependence, cigarettes, uncomplicated: Secondary | ICD-10-CM | POA: Insufficient documentation

## 2021-04-12 DIAGNOSIS — F1020 Alcohol dependence, uncomplicated: Secondary | ICD-10-CM

## 2021-04-12 DIAGNOSIS — Y900 Blood alcohol level of less than 20 mg/100 ml: Secondary | ICD-10-CM | POA: Diagnosis not present

## 2021-04-12 DIAGNOSIS — F10239 Alcohol dependence with withdrawal, unspecified: Secondary | ICD-10-CM | POA: Insufficient documentation

## 2021-04-12 DIAGNOSIS — F10930 Alcohol use, unspecified with withdrawal, uncomplicated: Secondary | ICD-10-CM

## 2021-04-12 DIAGNOSIS — F411 Generalized anxiety disorder: Secondary | ICD-10-CM | POA: Diagnosis not present

## 2021-04-12 DIAGNOSIS — F419 Anxiety disorder, unspecified: Secondary | ICD-10-CM | POA: Insufficient documentation

## 2021-04-12 DIAGNOSIS — Z20822 Contact with and (suspected) exposure to covid-19: Secondary | ICD-10-CM | POA: Insufficient documentation

## 2021-04-12 DIAGNOSIS — F4310 Post-traumatic stress disorder, unspecified: Secondary | ICD-10-CM

## 2021-04-12 DIAGNOSIS — F1010 Alcohol abuse, uncomplicated: Secondary | ICD-10-CM

## 2021-04-12 DIAGNOSIS — F10230 Alcohol dependence with withdrawal, uncomplicated: Secondary | ICD-10-CM | POA: Diagnosis not present

## 2021-04-12 DIAGNOSIS — R1110 Vomiting, unspecified: Secondary | ICD-10-CM | POA: Insufficient documentation

## 2021-04-12 DIAGNOSIS — F10939 Alcohol use, unspecified with withdrawal, unspecified: Secondary | ICD-10-CM | POA: Diagnosis present

## 2021-04-12 LAB — CBC WITH DIFFERENTIAL/PLATELET
Abs Immature Granulocytes: 0.01 10*3/uL (ref 0.00–0.07)
Basophils Absolute: 0 10*3/uL (ref 0.0–0.1)
Basophils Relative: 1 %
Eosinophils Absolute: 0.2 10*3/uL (ref 0.0–0.5)
Eosinophils Relative: 3 %
HCT: 37.6 % (ref 36.0–46.0)
Hemoglobin: 12.2 g/dL (ref 12.0–15.0)
Immature Granulocytes: 0 %
Lymphocytes Relative: 20 %
Lymphs Abs: 1.2 10*3/uL (ref 0.7–4.0)
MCH: 30.6 pg (ref 26.0–34.0)
MCHC: 32.4 g/dL (ref 30.0–36.0)
MCV: 94.2 fL (ref 80.0–100.0)
Monocytes Absolute: 0.7 10*3/uL (ref 0.1–1.0)
Monocytes Relative: 11 %
Neutro Abs: 3.9 10*3/uL (ref 1.7–7.7)
Neutrophils Relative %: 65 %
Platelets: 220 10*3/uL (ref 150–400)
RBC: 3.99 MIL/uL (ref 3.87–5.11)
RDW: 13.8 % (ref 11.5–15.5)
WBC: 6 10*3/uL (ref 4.0–10.5)
nRBC: 0 % (ref 0.0–0.2)

## 2021-04-12 LAB — COMPREHENSIVE METABOLIC PANEL
ALT: 19 U/L (ref 0–44)
AST: 42 U/L — ABNORMAL HIGH (ref 15–41)
Albumin: 4.5 g/dL (ref 3.5–5.0)
Alkaline Phosphatase: 60 U/L (ref 38–126)
Anion gap: 13 (ref 5–15)
BUN: 13 mg/dL (ref 6–20)
CO2: 28 mmol/L (ref 22–32)
Calcium: 9.6 mg/dL (ref 8.9–10.3)
Chloride: 100 mmol/L (ref 98–111)
Creatinine, Ser: 0.79 mg/dL (ref 0.44–1.00)
GFR, Estimated: >60 mL/min (ref >60)
Glucose, Bld: 111 mg/dL — ABNORMAL HIGH (ref 70–99)
Potassium: 3.1 mmol/L — ABNORMAL LOW (ref 3.5–5.1)
Sodium: 141 mmol/L (ref 135–145)
Total Bilirubin: 1.2 mg/dL (ref 0.3–1.2)
Total Protein: 7.8 g/dL (ref 6.5–8.1)

## 2021-04-12 LAB — URINALYSIS, COMPLETE (UACMP) WITH MICROSCOPIC
Bilirubin Urine: NEGATIVE
Glucose, UA: NEGATIVE mg/dL
Hgb urine dipstick: NEGATIVE
Ketones, ur: NEGATIVE mg/dL
Leukocytes,Ua: NEGATIVE
Nitrite: NEGATIVE
Protein, ur: 30 mg/dL — AB
Specific Gravity, Urine: 1.021 (ref 1.005–1.030)
pH: 7 (ref 5.0–8.0)

## 2021-04-12 LAB — RAPID URINE DRUG SCREEN, HOSP PERFORMED
Amphetamines: NOT DETECTED
Barbiturates: NOT DETECTED
Benzodiazepines: NOT DETECTED
Cocaine: POSITIVE — AB
Opiates: NOT DETECTED
Tetrahydrocannabinol: POSITIVE — AB

## 2021-04-12 LAB — ETHANOL: Alcohol, Ethyl (B): <10 mg/dL (ref <10)

## 2021-04-12 LAB — I-STAT BETA HCG BLOOD, ED (MC, WL, AP ONLY): I-stat hCG, quantitative: <5 m[IU]/mL (ref <5)

## 2021-04-12 LAB — LIPASE, BLOOD: Lipase: 35 U/L (ref 11–51)

## 2021-04-12 LAB — POC SARS CORONAVIRUS 2 AG -  ED: SARS Coronavirus 2 Ag: NEGATIVE

## 2021-04-12 MED ORDER — ONDANSETRON 4 MG PO TBDP
4.0000 mg | ORAL_TABLET | Freq: Once | ORAL | Status: AC
  Administered 2021-04-12: 4 mg via ORAL
  Filled 2021-04-12: qty 1

## 2021-04-12 MED ORDER — THIAMINE HCL 100 MG PO TABS
100.0000 mg | ORAL_TABLET | Freq: Every day | ORAL | Status: DC
  Administered 2021-04-12: 100 mg via ORAL
  Filled 2021-04-12: qty 1

## 2021-04-12 MED ORDER — LORAZEPAM 1 MG PO TABS
1.0000 mg | ORAL_TABLET | Freq: Once | ORAL | Status: AC
  Administered 2021-04-12: 1 mg via ORAL
  Filled 2021-04-12: qty 1

## 2021-04-12 MED ORDER — ACETAMINOPHEN 325 MG PO TABS
650.0000 mg | ORAL_TABLET | Freq: Once | ORAL | Status: AC
  Administered 2021-04-12: 650 mg via ORAL
  Filled 2021-04-12: qty 2

## 2021-04-12 MED ORDER — LORAZEPAM 1 MG PO TABS: 0.0000 mg | ORAL_TABLET | Freq: Four times a day (QID) | ORAL | Status: DC

## 2021-04-12 MED ORDER — ONDANSETRON 4 MG PO TBDP
8.0000 mg | ORAL_TABLET | Freq: Once | ORAL | Status: AC
  Administered 2021-04-12: 8 mg via ORAL
  Filled 2021-04-12: qty 2

## 2021-04-12 MED ORDER — LORAZEPAM 2 MG/ML IJ SOLN: 0.0000 mg | Freq: Two times a day (BID) | INTRAMUSCULAR | Status: DC

## 2021-04-12 MED ORDER — POTASSIUM CHLORIDE CRYS ER 20 MEQ PO TBCR: 40.0000 meq | EXTENDED_RELEASE_TABLET | Freq: Once | ORAL | Status: DC

## 2021-04-12 MED ORDER — ALUM & MAG HYDROXIDE-SIMETH 200-200-20 MG/5ML PO SUSP: 30.0000 mL | ORAL | Status: DC | PRN

## 2021-04-12 MED ORDER — THIAMINE HCL 100 MG/ML IJ SOLN: 100.0000 mg | Freq: Every day | INTRAMUSCULAR | Status: DC

## 2021-04-12 MED ORDER — LORAZEPAM 1 MG PO TABS: 0.0000 mg | ORAL_TABLET | Freq: Two times a day (BID) | ORAL | Status: DC

## 2021-04-12 MED ORDER — HYDROXYZINE HCL 25 MG PO TABS: 25.0000 mg | ORAL_TABLET | Freq: Three times a day (TID) | ORAL | Status: DC | PRN

## 2021-04-12 MED ORDER — LORAZEPAM 2 MG/ML IJ SOLN
0.0000 mg | Freq: Four times a day (QID) | INTRAMUSCULAR | Status: DC
  Administered 2021-04-12: 2 mg via INTRAVENOUS
  Filled 2021-04-12: qty 1

## 2021-04-12 MED ORDER — ACETAMINOPHEN 325 MG PO TABS
650.0000 mg | ORAL_TABLET | Freq: Four times a day (QID) | ORAL | Status: DC | PRN
  Administered 2021-04-13: 650 mg via ORAL
  Filled 2021-04-12: qty 2

## 2021-04-12 MED ORDER — MAGNESIUM HYDROXIDE 400 MG/5ML PO SUSP: 30.0000 mL | Freq: Every day | ORAL | Status: DC | PRN

## 2021-04-12 NOTE — ED Notes (Signed)
Patient vomited, and is experiencing nausea. Provider notified.

## 2021-04-12 NOTE — ED Provider Notes (Signed)
[place] DEPT Provider Note   CSN: [id number] Arrival date & time: 04/12/21  1632     History Chief Complaint  Patient presents with   Alcohol Problem    [name] is a 45 y.o. female w/ Hx of PTSD and anxiety. Patient seen at [facility] earlier today. She had elevated blood pressure today at her evaluation.  Patient states that she drinks about 3-1/2 pints of vodka daily.  She states she does not even get drunk anymore but just needs it not to have the shakes.  She has an eye-opener every morning and states that she just drinks all day long.  Patient had an episode of hypertension diaphoresis and vomiting at behavioral health urgent care.  She had a calculated CIWA score 15 prior to arrival she was given Ativan and took her regular dose of BuSpar with improvement in her symptoms.  Patient denies si/hi/avh.   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   Past Surgical History:  Procedure Laterality Date   APPENDECTOMY     CESAREAN SECTION       OB History     Gravida  4   Para  3   Term  3   Preterm      AB  1   Living  3      SAB      IAB      Ectopic      Multiple      Live Births  4           Family History  Problem Relation Age of Onset   Hypertension Mother    Breast cancer Mother    Diabetes Father    Hypertension Father    Breast cancer Paternal Grandmother     Social History   Tobacco Use   Smoking status: Some Days    Types: Cigarettes   Smokeless tobacco: Never  Vaping Use   Vaping Use: Never used  Substance Use Topics   Alcohol use: Yes    Comment: occ.   Drug use: No    Home Medications Prior to Admission medications   Medication Sig Start Date End Date Taking? Authorizing Provider  busPIRone (BUSPAR) 15  MG tablet Take 15 mg by mouth 3 (three) times daily. 12/04/20   [provider]  HYDROcodone-acetaminophen (NORCO/VICODIN) 5-325 MG tablet Take 2 tablets by mouth every 4 (four) hours as needed. Patient not taking: Reported on 12/06/2020 12/20/18   Nuala Alpha A, PA-C  ibuprofen (ADVIL) 200 MG tablet Take 400 mg by mouth every 6 (six) hours as needed for headache or moderate pain.    [provider]  ibuprofen (ADVIL) 600 MG tablet Take 600 mg by mouth every 6 (six) hours as needed for mild pain.    [provider]  medroxyPROGESTERone (DEPO-PROVERA) 150 MG/ML injection Inject 1 mL (150 mg total) into the muscle every 3 (three) months. Patient not taking: No sig reported 08/17/17   Shelly Bombard, MD  methocarbamol (ROBAXIN) 500 MG tablet Take 1 tablet (500 mg total) by mouth 2 (two) times daily. Patient not taking: No sig reported 10/21/17   Providence Lanius A, PA-C  Multiple Vitamins-Minerals (ONE-A-DAY WOMENS PO) Take 1 tablet by  mouth daily.    [provider]  ondansetron (ZOFRAN-ODT) 8 MG disintegrating tablet Take 8 mg by mouth every 12 (twelve) hours as needed. 05/02/19   [provider]  predniSONE (DELTASONE) 50 MG tablet Take 1 tablet (50 mg total) by mouth daily. Patient not taking: No sig reported 05/21/18   Ok Edwards, PA-C  sertraline (ZOLOFT) 100 MG tablet Take 100 mg by mouth daily. 06/22/20   [provider]  traZODone (DESYREL) 50 MG tablet Take 50 mg by mouth at bedtime as needed for sleep. 11/25/18   [provider]  triamcinolone (KENALOG) 0.025 % ointment Apply 1 application topically 2 (two) times daily. Patient not taking: No sig reported 05/21/18   Cathlean Sauer V, PA-C  urea (CARMOL) 10 % cream Apply topically as needed. Patient not taking: No sig reported 05/21/18   Ok Edwards, PA-C    Allergies    Patient has no known allergies.  Review of Systems   Review of Systems Ten systems reviewed and are negative for  acute change, except as noted in the HPI.   Physical Exam Updated Vital Signs BP (!) 154/93   Pulse (!) 58   Temp 98.2 F (36.8 C) (Oral)   Resp 18   Ht 5\' 3"  (1.6 m)   Wt 50.8 kg   SpO2 93%   BMI 19.84 kg/m   Physical Exam Vitals and nursing note reviewed.  Constitutional:      General: She is not in acute distress.    Appearance: She is well-developed. She is not diaphoretic.  HENT:     Head: Normocephalic and atraumatic.     Right Ear: External ear normal.     Left Ear: External ear normal.     Nose: Nose normal.     Mouth/Throat:     Mouth: Mucous membranes are moist.  Eyes:     General: No scleral icterus.    Conjunctiva/sclera: Conjunctivae normal.  Cardiovascular:     Rate and Rhythm: Normal rate and regular rhythm.     Heart sounds: Normal heart sounds. No murmur heard.   No friction rub. No gallop.  Pulmonary:     Effort: Pulmonary effort is normal. No respiratory distress.     Breath sounds: Normal breath sounds.  Abdominal:     General: Bowel sounds are normal. There is no distension.     Palpations: Abdomen is soft. There is no mass.     Tenderness: There is no abdominal tenderness. There is no guarding.  Musculoskeletal:     Cervical back: Normal range of motion.  Skin:    General: Skin is warm and dry.  Neurological:     Mental Status: She is alert and oriented to person, place, and time.  Psychiatric:        Behavior: Behavior normal.    ED Results / Procedures / Treatments   Labs (all labs ordered are listed, but only abnormal results are displayed) Labs Reviewed  COMPREHENSIVE METABOLIC PANEL - Abnormal; Notable for the following components:      Result Value   Potassium 3.1 (*)    Glucose, Bld 111 (*)    AST 42 (*)    All other components within normal limits  RAPID URINE DRUG SCREEN, HOSP PERFORMED - Abnormal; Notable for the following components:   Cocaine POSITIVE (*)    Tetrahydrocannabinol POSITIVE (*)    All other components  within normal limits  URINALYSIS, COMPLETE (UACMP) WITH MICROSCOPIC - Abnormal; Notable for  the following components:   APPearance HAZY (*)    Protein, ur 30 (*)    Bacteria, UA RARE (*)    All other components within normal limits  ETHANOL  CBC WITH DIFFERENTIAL/PLATELET  LIPASE, BLOOD  I-STAT BETA HCG BLOOD, ED ([facility], [facility], [facility] ONLY)    EKG None  Radiology No results found.  Procedures Procedures   Medications Ordered in ED Medications  LORazepam (ATIVAN) injection 0-4 mg (2 mg Intravenous Given 04/12/21 1953)    Or  LORazepam (ATIVAN) tablet 0-4 mg ( Oral See Alternative 04/12/21 1953)  LORazepam (ATIVAN) injection 0-4 mg (has no administration in time range)    Or  LORazepam (ATIVAN) tablet 0-4 mg (has no administration in time range)  thiamine tablet 100 mg (100 mg Oral Given 04/12/21 1820)    Or  thiamine (B-1) injection 100 mg ( Intravenous See Alternative 04/12/21 1820)  potassium chloride SA (KLOR-CON) CR tablet 40 mEq (has no administration in time range)  acetaminophen (TYLENOL) tablet 650 mg (650 mg Oral Given 04/12/21 1947)  ondansetron (ZOFRAN-ODT) disintegrating tablet 4 mg (4 mg Oral Given 04/12/21 1947)    ED Course  I have reviewed the triage vital signs and the nursing notes.  Pertinent labs & imaging results that were available during my care of the patient were reviewed by me and considered in my medical decision making (see chart for details).    MDM Rules/Calculators/A&P                           45 year old female here with a history of alcohol dependence.  Sent in from behavioral health urgent care earlier today for presumed withdrawal and elevated blood pressure.  Patient has had blood pressure in the 150s for the past several hours.  She has had labs which showed no acute abnormalities.  The patient is tolerating oral benzodiazepines for management of her alcohol dependence well.  At this point I think she is medically clear and medically stable would  not meet admission criteria for alcohol withdrawal.  I discussed the case with Fransico Meadow, NP via telephone who is excepted the patient back to behavioral health urgent care. Final Clinical Impression(s) / ED Diagnoses Final diagnoses:  Uncomplicated alcohol dependence [facility])    Rx / DC Orders ED Discharge Orders     None        Margarita Mail, PA-C 04/12/21 2123    Lucrezia Starch, MD 04/14/21 [phone]

## 2021-04-12 NOTE — Discharge Instructions (Addendum)
Patient will be transferred to [facility]

## 2021-04-12 NOTE — ED Triage Notes (Signed)
Pt sent from [facility]  Pt had presented there for alcohol treatment.  Pt became hypertensive while there with systolic of 939S. Pt does endorse hx of anxiety.

## 2021-04-12 NOTE — Progress Notes (Signed)
   04/12/21 1359  [place] (Walk-ins at [facility] only)  How Did You Hear About [place]? Self  What Is the Reason for Your Visit/Call Today? Pt presented voluntarily and unaccompanied requesting assistance with detox from alcohol. Pt stated her last drink was this morning about 8 am and she does begin to have withdrawal symptoms if she does not drink alcohol. Pt denies seizures but has had DTs in the past. Pt denies SI, HI, AVH, NSSH and paranoia. Pt reports use of marijuana about 2 x month using 1 blunt each time. Pt denies any psych admissions. Pt reported a hx of PTSD and GAD.  How Long Has This Been Causing You Problems? <Week  Have You Recently Had Any Thoughts About Hurting Yourself? No  Are You Planning to Commit Suicide/Harm Yourself At This time? No  Have you Recently Had Thoughts About [place]? No  Are You Planning To Harm Someone At This Time? No  Are you currently experiencing any auditory, visual or other hallucinations? No  Have You Used Any Alcohol or Drugs in the Past 24 Hours? Yes  How long ago did you use Drugs or Alcohol? 8 am this morning  What Did You Use and How Much? Pt reported drinking a large container of beer about 8 am this morning.  Do you have any current medical co-morbidities that require immediate attention? Yes  Please describe current medical co-morbidities that require immediate attention: Withdrawal symptoms strating  Clinician description of patient physical appearance/behavior: Pt was casually dressed and adequately groomed. Pt was polite, talkative and cooperative. Pt's speech, movement and thought content were within normal limits. Pt's mood was with a full range of expression. Pt was oriented x 4.  What Do You Feel Would Help You the Most Today? Alcohol or Drug Use Treatment (Detox)  If access to [facility] was not available, would you have sought care in the Emergency Department? Yes  Determination of Need Urgent (48 hours)  Options  For Referral Facility-Based Crisis (Detox)  Jourden Gilson T. Mare Ferrari, [place], [facility], [facility] Triage Specialist [facility]

## 2021-04-12 NOTE — Discharge Instructions (Signed)
Please go to [facility] for further management.

## 2021-04-12 NOTE — ED Provider Notes (Signed)
Suicide Risk Assessment   [facility] Admission Suicide Risk Assessment  Diagnosis:  Active Problems:   Alcohol use disorder, severe, dependence ([place])  [name] is a 45 y.o. female who presented to [facility] on 04/12/2021. She was transferred to the ED for medical clearance. Patient transferred back to [facility] after medical clearance for admission to [facility].    On evaluation this evening, the patient confirms information in TTS assessment and below NP assessment. Patient is alert and oriented x 4, pleasant, and cooperative. Speech is clear and coherent. Mood is anxious and affect is congruent with mood. Thought process is coherent and thought content is logical. Denies auditory and visual hallucinations. No indication that patient is responding to internal stimuli. No evidence of delusional thought content. Denies suicidal ideations. Denies homicidal ideations. Patient repors taht weight loss that has occurred since starting buspirone for anxiety. She is requesting to stop buspirone. Discussed other options for treatment of anxiety. Patient inquired about xanax. Discussed that controlled substances would not be prescribed on discharge. Discussed the use of ativan for withdrawal symptoms and starting hydroxyzine for anxiety.    Assessment note Michelle Shields, NP: [name], 45 y.o., female patient seen face to face by this provider, consulted with Dr. Dwyane Dee; and chart reviewed on 04/12/21.  On evaluation [name] reports she has a history of PTSD and anxiety.  States for the past year she drinks daily.  She drinks 3-1/2 pints of vodka per day.  Her last drink was this morning.  Denies having a history of seizures or delirium tremors. States she has never detox from alcohol in the past. Patient is requesting help states, "I cant do this on my own I am too sick".During evaluation [name] is in sitting position in distress.  She is sitting in the chair with her head over a trash can  vomiting.  She is diaphoretic and tremulous.  She makes fair eye contact and is fairly groomed.  She is alert/oriented x 4.  She is cooperative.  Endorses anxiety, denies depression.  Her affect is dysphoric. Reports she sleeps when she drinks and eats little due to alcohol use. Her thought process is coherent and relevant; There is no indication that she is currently responding to internal/external stimuli or experiencing delusional thought content; and she has denied suicidal/self-harm/homicidal ideation, psychosis, and paranoia.    Demographic Factors:  Unemployed  Loss Factors: Loss of significant relationship  Historical Factors: Anniversary of important loss  Risk Reduction Factors:   Responsible for children under 29 years of age, Sense of responsibility to family, Religious beliefs about death, Living with another person, especially a relative, and Positive social support  Continued Clinical Symptoms:  Severe Anxiety and/or Agitation Panic Attacks Alcohol/Substance Abuse/Dependencies Medical Diagnoses and Treatments/Surgeries  Cognitive Features That Contribute To Risk:  None    Suicide Risk:  Minimal: No identifiable suicidal ideation.  Patients presenting with no risk factors but with morbid ruminations; may be classified as minimal risk based on the severity of the depressive symptoms   Musculoskeletal: Strength & Muscle Tone: within normal limits Gait & Station: normal Patient leans: N/A  Psychiatric Specialty Exam:  Presentation  General Appearance: Casual  Eye Contact:Fair  Speech:Clear and Coherent; Normal Rate  Speech Volume:Normal  Handedness: No data recorded  Mood and Affect  Mood:Anxious  Affect:Congruent   Thought Process  Thought Processes:Coherent  Descriptions of Associations:Intact  Orientation:Full (Time, Place and Person)  Thought Content:WDL  History of Schizophrenia/Schizoaffective disorder:No data recorded  Duration of  Psychotic Symptoms:No data recorded Hallucinations:Hallucinations: None  Ideas of Reference:None  Suicidal Thoughts:Suicidal Thoughts: No  Homicidal Thoughts:Homicidal Thoughts: No   Sensorium  Memory:Immediate Good; Recent Good; Remote Good  Judgment:Fair  Insight:Fair   Executive Functions  Concentration:Good  Attention Span:Good  [place] of Knowledge:Good  Language:Good   Psychomotor Activity  Psychomotor Activity:Psychomotor Activity: Normal   Assets  Assets:Communication Skills; Desire for Improvement; Financial Resources/Insurance; Housing; Physical Health; Social Support   Sleep  Sleep:Sleep: Poor    Physical Exam: Physical Exam Constitutional:      General: She is not in acute distress.    Appearance: She is not ill-appearing, toxic-appearing or diaphoretic.  Eyes:     Pupils: Pupils are equal, round, and reactive to light.  Cardiovascular:     Rate and Rhythm: Normal rate.  Pulmonary:     Effort: Pulmonary effort is normal. No respiratory distress.  Musculoskeletal:        General: Normal range of motion.  Skin:    General: Skin is warm and dry.  Neurological:     General: No focal deficit present.     Mental Status: She is alert and oriented to person, place, and time.  Psychiatric:        Mood and Affect: Mood is anxious.        Behavior: Behavior is cooperative.        Thought Content: Thought content is not paranoid or delusional. Thought content does not include homicidal or suicidal ideation.   Review of Systems  Constitutional:  Negative for chills, diaphoresis, fever, malaise/fatigue and weight loss.  HENT:  Negative for congestion.   Respiratory:  Negative for cough and shortness of breath.   Cardiovascular:  Negative for chest pain and palpitations.  Gastrointestinal:  Negative for diarrhea, nausea and vomiting.  Neurological:  Negative for dizziness and seizures.  Psychiatric/Behavioral:  Positive for substance  abuse. Negative for depression, hallucinations, memory loss and suicidal ideas. The patient is nervous/anxious and has insomnia.   All other systems reviewed and are negative.  Blood pressure (!) 176/104, pulse 62, temperature 97.9 F (36.6 C), temperature source Oral, resp. rate 18, SpO2 100 %. There is no height or weight on file to calculate BMI.   I certify that inpatient services furnished can reasonably be expected to improve the patient's condition.   Michelle Nunnery, NP 04/13/2021, 9:01 AM

## 2021-04-12 NOTE — ED Notes (Signed)
Safe transport contacted and will arrive shortly to transport the patient

## 2021-04-12 NOTE — ED Provider Notes (Signed)
Behavioral Health Urgent Care Medical Screening Exam  Patient Name: [name] MRN: [id number] Date of Evaluation: 04/12/21 Chief Complaint:   Diagnosis:  Final diagnoses:  Alcohol abuse    History of Present illness: [name] is a 45 y.o. female patient presented to [facility] as a walk in alone  with complaints of "I need help with detox".  [name], 45 y.o., female patient seen face to face by this provider, consulted with Dr. Dwyane Dee; and chart reviewed on 04/12/21.  On evaluation [name] reports she has a history of PTSD and anxiety.  States for the past year she drinks daily.  She drinks 3-1/2 pints of vodka per day.  Her last drink was this morning.  Denies having a history of seizures or delirium tremors. States she has never detox from alcohol in the past. Patient is requesting help states, "I cant do this on my own I am too sick".  During evaluation [name] is in sitting position in distress.  She is sitting in the chair with her head over a trash can vomiting.  She is diaphoretic and tremulous.  She makes fair eye contact and is fairly groomed.  She is alert/oriented x 4.  She is cooperative.  Endorses anxiety, denies depression.  Her affect is dysphoric. Reports she sleeps when she drinks and eats little due to alcohol use. Her thought process is coherent and relevant; There is no indication that she is currently responding to internal/external stimuli or experiencing delusional thought content; and she has denied suicidal/self-harm/homicidal ideation, psychosis, and paranoia.    Patients bp is 180/103.  Patient will be transported to the [facility] for medical clearance. Report given to Dr. Enzo Bi, he accepted pateint.    Psychiatric Specialty Exam  Presentation  General Appearance:Casual  Eye Contact:Fair  Speech:Clear and Coherent; Normal Rate  Speech Volume:Normal  Handedness:No data recorded  Mood and Affect   Mood:Anxious  Affect:Congruent   Thought Process  Thought Processes:Coherent  Descriptions of Associations:Intact  Orientation:Full (Time, Place and Person)  Thought Content:Logical    Hallucinations:None  Ideas of Reference:None  Suicidal Thoughts:No  Homicidal Thoughts:No   Sensorium  Memory:Immediate Good; Recent Good; Remote Poor  Judgment:Fair  Insight:Fair   Executive Functions  Concentration:Good  Attention Span:Good  [place]  Language:Good   Psychomotor Activity  Psychomotor Activity:Normal   Assets  Assets:Communication Skills; Desire for Improvement; Financial Resources/Insurance; Physical Health; Resilience   Sleep  Sleep:Poor  Number of hours:  No data recorded  No data recorded  Physical Exam: Physical Exam Vitals and nursing note reviewed.  Constitutional:      General: She is in acute distress.     Appearance: Normal appearance. She is diaphoretic.  HENT:     Head: Normocephalic.  Eyes:     General:        Right eye: No discharge.        Left eye: No discharge.     Conjunctiva/sclera: Conjunctivae normal.  Cardiovascular:     Rate and Rhythm: Normal rate.  Pulmonary:     Effort: Pulmonary effort is normal.  Musculoskeletal:        General: Normal range of motion.     Cervical back: Normal range of motion.  Skin:    Coloration: Skin is not jaundiced or pale.  Neurological:     Mental Status: She is alert and oriented to person, place, and time.  Psychiatric:  Attention and Perception: Attention and perception normal.        Mood and Affect: Mood is anxious.        Speech: Speech normal.        Behavior: Behavior normal. Behavior is cooperative.        Thought Content: Thought content normal.        Cognition and Memory: Cognition normal.        Judgment: Judgment is impulsive.   Review of Systems  Constitutional:  Positive for diaphoresis.  HENT: Negative.    Eyes: Negative.    Respiratory: Negative.    Cardiovascular: Negative.   Gastrointestinal:  Positive for nausea and vomiting.  Genitourinary: Negative.   Musculoskeletal: Negative.   Skin: Negative.   Neurological: Negative.   Psychiatric/Behavioral:  The patient is nervous/anxious.   Blood pressure (!) 180/103, pulse 77, temperature 98.7 F (37.1 C), resp. rate 18, SpO2 100 %. There is no height or weight on file to calculate BMI.  Musculoskeletal: Strength & Muscle Tone: within normal limits Gait & Station: normal Patient leans: N/A   [facility] MSE Discharge Disposition for Follow up and Recommendations: Based on my evaluation the patient appears to have an emergency medical condition for which I recommend the patient be transferred to the emergency department for further evaluation.   Transfer patient to the [facility] for medical clearance.   1 mg of ativan po was given to patient due to condition and CIWA of 15    Patient could be considered for [facility] admission once she is medically stable and BP is within parameters for 24 hours.    Revonda Humphrey, NP 04/12/2021, 3:54 PM

## 2021-04-12 NOTE — Progress Notes (Signed)
Patient to be transferred to [facility].  She is being transferred for detox from [facility] with a CIWA of 15 b/p 180/103. She received 1mg  ativan and 8mg  zofran PO now.

## 2021-04-12 NOTE — ED Notes (Signed)
[facility] finally contacted for specimen transport

## 2021-04-13 ENCOUNTER — Encounter (HOSPITAL_COMMUNITY): Payer: Self-pay | Admitting: Psychiatry

## 2021-04-13 DIAGNOSIS — F411 Generalized anxiety disorder: Secondary | ICD-10-CM | POA: Diagnosis not present

## 2021-04-13 DIAGNOSIS — F10939 Alcohol use, unspecified with withdrawal, unspecified: Secondary | ICD-10-CM | POA: Diagnosis present

## 2021-04-13 DIAGNOSIS — R61 Generalized hyperhidrosis: Secondary | ICD-10-CM | POA: Diagnosis not present

## 2021-04-13 DIAGNOSIS — F10230 Alcohol dependence with withdrawal, uncomplicated: Secondary | ICD-10-CM | POA: Diagnosis not present

## 2021-04-13 DIAGNOSIS — Z20822 Contact with and (suspected) exposure to covid-19: Secondary | ICD-10-CM | POA: Diagnosis not present

## 2021-04-13 LAB — LIPID PANEL
Cholesterol: 207 mg/dL — ABNORMAL HIGH (ref 0–200)
HDL: 120 mg/dL (ref >40)
LDL Cholesterol: 77 mg/dL (ref 0–99)
Total CHOL/HDL Ratio: 1.7 RATIO
Triglycerides: 51 mg/dL (ref <150)
VLDL: 10 mg/dL (ref 0–40)

## 2021-04-13 LAB — POC SARS CORONAVIRUS 2 AG -  ED: SARS Coronavirus 2 Ag: NEGATIVE

## 2021-04-13 LAB — RESP PANEL BY RT-PCR (FLU A&B, COVID) ARPGX2
Influenza A by PCR: NEGATIVE
Influenza B by PCR: NEGATIVE
SARS Coronavirus 2 by RT PCR: NEGATIVE

## 2021-04-13 LAB — HEMOGLOBIN A1C
Hgb A1c MFr Bld: 5.2 % (ref 4.8–5.6)
Mean Plasma Glucose: 102.54 mg/dL

## 2021-04-13 MED ORDER — LORAZEPAM 1 MG PO TABS
1.0000 mg | ORAL_TABLET | Freq: Every day | ORAL | Status: DC
Start: 2021-04-17 — End: 2021-04-13

## 2021-04-13 MED ORDER — THIAMINE HCL 100 MG PO TABS
100.0000 mg | ORAL_TABLET | Freq: Every day | ORAL | Status: DC
Start: 2021-04-14 — End: 2021-04-13

## 2021-04-13 MED ORDER — LORAZEPAM 1 MG PO TABS: 1.0000 mg | ORAL_TABLET | Freq: Every day | ORAL | Status: DC

## 2021-04-13 MED ORDER — CLONIDINE HCL 0.1 MG PO TABS
0.1000 mg | ORAL_TABLET | Freq: Every day | ORAL | Status: DC
  Administered 2021-04-13: 0.1 mg via ORAL
  Filled 2021-04-13: qty 1

## 2021-04-13 MED ORDER — LORAZEPAM 1 MG PO TABS: 1.0000 mg | ORAL_TABLET | Freq: Three times a day (TID) | ORAL | Status: DC

## 2021-04-13 MED ORDER — TRAZODONE HCL 50 MG PO TABS: 50.0000 mg | ORAL_TABLET | Freq: Every evening | ORAL | Status: DC | PRN

## 2021-04-13 MED ORDER — SERTRALINE HCL 100 MG PO TABS
100.0000 mg | ORAL_TABLET | Freq: Every day | ORAL | Status: DC
  Administered 2021-04-13: 100 mg via ORAL
  Filled 2021-04-13: qty 1

## 2021-04-13 MED ORDER — LORAZEPAM 1 MG PO TABS: 1.0000 mg | ORAL_TABLET | Freq: Four times a day (QID) | ORAL | Status: DC | PRN

## 2021-04-13 MED ORDER — LORAZEPAM 1 MG PO TABS: 1.0000 mg | ORAL_TABLET | Freq: Two times a day (BID) | ORAL | Status: DC

## 2021-04-13 MED ORDER — LORAZEPAM 1 MG PO TABS: 1.0000 mg | ORAL_TABLET | Freq: Four times a day (QID) | ORAL | Status: DC

## 2021-04-13 MED ORDER — NICOTINE 14 MG/24HR TD PT24
14.0000 mg | MEDICATED_PATCH | Freq: Every day | TRANSDERMAL | Status: DC
  Administered 2021-04-13: 14 mg via TRANSDERMAL
  Filled 2021-04-13: qty 1

## 2021-04-13 MED ORDER — LORAZEPAM 1 MG PO TABS
1.0000 mg | ORAL_TABLET | Freq: Four times a day (QID) | ORAL | Status: DC
  Administered 2021-04-13 (×3): 1 mg via ORAL
  Filled 2021-04-13 (×3): qty 1

## 2021-04-13 MED ORDER — ADULT MULTIVITAMIN W/MINERALS CH
1.0000 | ORAL_TABLET | Freq: Every day | ORAL | Status: DC
  Administered 2021-04-13: 1 via ORAL
  Filled 2021-04-13: qty 1

## 2021-04-13 MED ORDER — LOPERAMIDE HCL 2 MG PO CAPS: 2.0000 mg | ORAL_CAPSULE | ORAL | Status: DC | PRN

## 2021-04-13 MED ORDER — ONDANSETRON 4 MG PO TBDP: 4.0000 mg | ORAL_TABLET | Freq: Four times a day (QID) | ORAL | Status: DC | PRN

## 2021-04-13 NOTE — Clinical Social Work Psych Note (Signed)
CSW Note   CSW met with [facility] for introduction and to begin discussions regarding discharge planning. This morning, [name] reports feeling good. She was observed walking on the unit and appeared to be in a better mood, however she did report an increase in her anxiety. [name] denied having any SI, HI, AVH at this time.   [name] reports that she cannot identify any triggering events or persons, however she was insightful regarding her need to seek treatment for her alcohol use. [name] reports she understands she needs professional help, if she wants to make the correct adjustments/changes in her life. [name] identified her children as her main supports and motivating factors.   [name] reports for the past year she drinks daily.  She reports drinking 3-1/2 pints of vodka per day. [name] did not endorse any other substance use, however her UDS resulted positive for cocaine.    [name] requested to discharge tomorrow, so that she can have time with her children before participating in a residential rehabilitation program. [name] ensured CSW that she was participating in treatment, however wanted to "commune" with her family first.   [name] will discharge home with resources and appointments if applicable.   [name] has been referred to the following facilities for review:  [place]  CSW will continue to follow for potential placement and any other needs or concerns identified.   [name], MSW, LCSW Clinical Education officer, museum ([place]) [facility]

## 2021-04-13 NOTE — ED Notes (Signed)
Pt on phone. No acute distress noted. Safety maintained.

## 2021-04-13 NOTE — ED Provider Notes (Signed)
Behavioral Health Admission H&P [facility] & OBS)  Date: 04/13/21 Patient Name: [name] MRN: [id number] Chief Complaint:  Chief Complaint  Patient presents with   Alcohol Problem       Diagnoses:  Final diagnoses:  GAD (generalized anxiety disorder)  PTSD (post-traumatic stress disorder)  Alcohol use disorder, severe, dependence ([place])    HPI: [name] is a 45 y.o. female who presented to [facility] on 04/12/2021. She was transferred to the ED for medical clearance. Patient transferred back to [facility] after medical clearance for admission to [facility].   On evaluation this evening, the patient confirms information in TTS assessment and below NP assessment. Patient is alert and oriented x 4, pleasant, and cooperative. Speech is clear and coherent. Mood is anxious and affect is congruent with mood. Thought process is coherent and thought content is logical. Denies auditory and visual hallucinations. No indication that patient is responding to internal stimuli. No evidence of delusional thought content. Denies suicidal ideations. Denies homicidal ideations. Patient repors taht weight loss that has occurred since starting buspirone for anxiety. She is requesting to stop buspirone. Discussed other options for treatment of anxiety. Patient inquired about xanax. Discussed that controlled substances would not be prescribed on discharge. Discussed the use of ativan for withdrawal symptoms and starting hydroxyzine for anxiety.   Assessment note Michelle Shields, NP: [name], 45 y.o., female patient seen face to face by this provider, consulted with Dr. Dwyane Dee; and chart reviewed on 04/12/21.  On evaluation [name] reports she has a history of PTSD and anxiety.  States for the past year she drinks daily.  She drinks 3-1/2 pints of vodka per day.  Her last drink was this morning.  Denies having a history of seizures or delirium tremors. States she has never detox from alcohol in the  past. Patient is requesting help states, "I cant do this on my own I am too sick".During evaluation [name] is in sitting position in distress.  She is sitting in the chair with her head over a trash can vomiting.  She is diaphoretic and tremulous.  She makes fair eye contact and is fairly groomed.  She is alert/oriented x 4.  She is cooperative.  Endorses anxiety, denies depression.  Her affect is dysphoric. Reports she sleeps when she drinks and eats little due to alcohol use. Her thought process is coherent and relevant; There is no indication that she is currently responding to internal/external stimuli or experiencing delusional thought content; and she has denied suicidal/self-harm/homicidal ideation, psychosis, and paranoia.    PHQ 2-9:   [place] ED from 04/12/2021 in [facility] Most recent reading at 04/13/2021  4:09 AM ED from 04/12/2021 in [place] DEPT Most recent reading at 04/12/2021  4:47 PM ED from 12/05/2020 in [facility] Most recent reading at 12/05/2020  4:25 PM  C-SSRS RISK CATEGORY No Risk No Risk No Risk        Total Time spent with patient: 30 minutes  Musculoskeletal  Strength & Muscle Tone: within normal limits Gait & Station: normal Patient leans: N/A  Psychiatric Specialty Exam  Presentation General Appearance: Casual  Eye Contact:Fair  Speech:Clear and Coherent; Normal Rate  Speech Volume:Normal  Handedness:No data recorded  Mood and Affect  Mood:Anxious  Affect:Congruent   Thought Process  Thought Processes:Coherent  Descriptions of Associations:Intact  Orientation:Full (Time, Place and Person)  Thought Content:WDL    Hallucinations:Hallucinations: None  Ideas  of Reference:None  Suicidal Thoughts:Suicidal Thoughts: No  Homicidal Thoughts:Homicidal Thoughts: No   Sensorium  Memory:Immediate Good; Recent Good; Remote  Good  Judgment:Fair  Insight:Fair   Executive Functions  Concentration:Good  Attention Span:Good  [place] of Knowledge:Good  Language:Good   Psychomotor Activity  Psychomotor Activity:Psychomotor Activity: Normal   Assets  Assets:Communication Skills; Desire for Improvement; Financial Resources/Insurance; Housing; Physical Health; Social Support   Sleep  Sleep:Sleep: Poor   Nutritional Assessment (For OBS and FBC admissions only) Has the patient had a weight loss or gain of 10 pounds or more in the last 3 months?: Yes Has the patient had a decrease in food intake/or appetite?: Yes Does the patient have dental problems?: No Does the patient have eating habits or behaviors that may be indicators of an eating disorder including binging or inducing vomiting?: No Has the patient recently lost weight without trying?: 3 Has the patient been eating poorly because of a decreased appetite?: 1 Malnutrition Screening Tool Score: 4 Nutritional Assessment Referrals: Medication/Tx changes   Physical Exam Constitutional:      General: She is not in acute distress.    Appearance: She is not ill-appearing, toxic-appearing or diaphoretic.  HENT:     Head: Normocephalic.     Right Ear: External ear normal.     Left Ear: External ear normal.  Eyes:     Conjunctiva/sclera: Conjunctivae normal.     Pupils: Pupils are equal, round, and reactive to light.  Cardiovascular:     Rate and Rhythm: Normal rate.  Pulmonary:     Effort: Pulmonary effort is normal. No respiratory distress.  Musculoskeletal:        General: Normal range of motion.  Skin:    General: Skin is warm and dry.  Neurological:     Mental Status: She is alert and oriented to person, place, and time.  Psychiatric:        Mood and Affect: Mood is anxious.        Thought Content: Thought content is not paranoid or delusional. Thought content does not include homicidal or suicidal ideation.   Review of  Systems  Constitutional:  Negative for chills, diaphoresis, fever, malaise/fatigue and weight loss.  HENT:  Negative for congestion.   Respiratory:  Negative for cough and shortness of breath.   Cardiovascular:  Negative for chest pain and palpitations.  Gastrointestinal:  Negative for diarrhea, nausea and vomiting.  Neurological:  Negative for dizziness and seizures.  Psychiatric/Behavioral:  Positive for substance abuse. Negative for depression, hallucinations, memory loss and suicidal ideas. The patient is nervous/anxious and has insomnia.   All other systems reviewed and are negative.  Blood pressure (!) 176/104, pulse 62, temperature 97.9 F (36.6 C), temperature source Oral, resp. rate 18, SpO2 100 %. There is no height or weight on file to calculate BMI.  Past Psychiatric History: GAD, PTSD  Is the patient at risk to self? No  Has the patient been a risk to self in the past 6 months? No .    Has the patient been a risk to self within the distant past? No   Is the patient a risk to others? No   Has the patient been a risk to others in the past 6 months? No   Has the patient been a risk to others within the distant past? 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[place])     Past Surgical History:  Procedure Laterality Date   APPENDECTOMY     CESAREAN SECTION      Family History:  Family History  Problem Relation Age of Onset   Hypertension Mother    Breast cancer Mother    Diabetes Father    Hypertension Father    Breast cancer Paternal Grandmother     Social History:  Social History   Socioeconomic History   Marital status: Single    Spouse name: Not on file   Number of children: Not on file   Years of education: Not on file   Highest education level: Not on file  Occupational History   Not on file  Tobacco Use   Smoking status: Some  Days    Types: Cigarettes   Smokeless tobacco: Never  Vaping Use   Vaping Use: Never used  Substance and Sexual Activity   Alcohol use: Yes    Comment: occ.   Drug use: No   Sexual activity: Yes    Birth control/protection: Implant  Other Topics Concern   Not on file  Social History Narrative   Not on file   Social Determinants of Health   Financial Resource Strain: Not on file  Food Insecurity: Not on file  Transportation Needs: Not on file  Physical Activity: Not on file  Stress: Not on file  Social Connections: Not on file  Intimate Partner Violence: Not on file    SDOH:  SDOH Screenings   Alcohol Screen: Not on file  Depression (PHQ2-9): Not on file  Financial Resource Strain: Not on file  Food Insecurity: Not on file  Housing: Not on file  Physical Activity: Not on file  Social Connections: Not on file  Stress: Not on file  Tobacco Use: High Risk   Smoking Tobacco Use: Some Days   Smokeless Tobacco Use: Never  Transportation Needs: Not on file    Last Labs:  Admission on 04/12/2021  Component Date Value Ref Range Status   SARS Coronavirus 2 Ag 04/12/2021 Negative  Negative Preliminary   SARS Coronavirus 2 by RT PCR 04/12/2021 NEGATIVE  NEGATIVE Final   Comment: (NOTE) SARS-CoV-2 target nucleic acids are NOT DETECTED.  The SARS-CoV-2 RNA is generally detectable in upper respiratory specimens during the acute phase of infection. The lowest concentration of SARS-CoV-2 viral copies this assay can detect is 138 copies/mL. A negative result does not preclude SARS-Cov-2 infection and should not be used as the sole basis for treatment or other patient management decisions. A negative result may occur with  improper specimen collection/handling, submission of specimen other than nasopharyngeal swab, presence of viral mutation(s) within the areas targeted by this assay, and inadequate number of viral copies(<138 copies/mL). A negative result must be combined  with clinical observations, patient history, and epidemiological information. The expected result is Negative.  Fact Sheet for Patients:  EntrepreneurPulse.com.au  Fact Sheet for Healthcare Providers:  IncredibleEmployment.be  This test is no                          t yet approved or cleared by the [place] FDA and  has been authorized for detection and/or diagnosis of SARS-CoV-2 by FDA under an Emergency Use Authorization (EUA). This EUA will remain  in effect (meaning this test can be used) for the duration of the COVID-19 declaration under Section 564(b)(1) of the Act, 21 U.S.C.section  360bbb-3(b)(1), unless the authorization is terminated  or revoked sooner.       Influenza A by PCR 04/12/2021 NEGATIVE  NEGATIVE Final   Influenza B by PCR 04/12/2021 NEGATIVE  NEGATIVE Final   Comment: (NOTE) The Xpert Xpress SARS-CoV-2/FLU/RSV plus assay is intended as an aid in the diagnosis of influenza from Nasopharyngeal swab specimens and should not be used as a sole basis for treatment. Nasal washings and aspirates are unacceptable for Xpert Xpress SARS-CoV-2/FLU/RSV testing.  Fact Sheet for Patients: EntrepreneurPulse.com.au  Fact Sheet for Healthcare Providers: IncredibleEmployment.be  This test is not yet approved or cleared by the [place] FDA and has been authorized for detection and/or diagnosis of SARS-CoV-2 by FDA under an Emergency Use Authorization (EUA). This EUA will remain in effect (meaning this test can be used) for the duration of the COVID-19 declaration under Section 564(b)(1) of the Act, 21 U.S.C. section 360bbb-3(b)(1), unless the authorization is terminated or revoked.  Performed at [facility], [place] [address], [place], [place] [address]    Hgb A1c MFr Bld 04/12/2021 5.2  4.8 - 5.6 % Final   Comment: (NOTE) Pre diabetes:          5.7%-6.4%  Diabetes:               >6.4%  Glycemic control for   <7.0% adults with diabetes    Mean Plasma Glucose 04/12/2021 102.54  mg/dL Final   Performed at [facility], [place] [address], [place], [place] [address]   Cholesterol 04/12/2021 207 (A) 0 - 200 mg/dL Final   Triglycerides 04/12/2021 51  <150 mg/dL Final   HDL 04/12/2021 120  >40 mg/dL Final   Total CHOL/HDL Ratio 04/12/2021 1.7  RATIO Final   VLDL 04/12/2021 10  0 - 40 mg/dL Final   LDL Cholesterol 04/12/2021 77  0 - 99 mg/dL Final   Comment:        Total Cholesterol/HDL:CHD Risk Coronary Heart Disease Risk Table                     Men   Women  1/2 Average Risk   3.4   3.3  Average Risk       5.0   4.4  2 X Average Risk   9.6   7.1  3 X Average Risk  23.4   11.0        Use the calculated Patient Ratio above and the CHD Risk Table to determine the patient's CHD Risk.        ATP III CLASSIFICATION (LDL):  <100     mg/dL   Optimal  100-129  mg/dL   Near or Above                    Optimal  130-159  mg/dL   Borderline  160-189  mg/dL   High  >190     mg/dL   Very High Performed at [place] [address], [place], [place] [address]   Admission on 04/12/2021, Discharged on 04/12/2021  Component Date Value Ref Range Status   Sodium 04/12/2021 141  135 - 145 mmol/L Final   Potassium 04/12/2021 3.1 (A) 3.5 - 5.1 mmol/L Final   Chloride 04/12/2021 100  98 - 111 mmol/L Final   CO2 04/12/2021 28  22 - 32 mmol/L Final   Glucose, Bld 04/12/2021 111 (A) 70 - 99 mg/dL Final   Glucose reference range applies only to  samples taken after fasting for at least 8 hours.   BUN 04/12/2021 13  6 - 20 mg/dL Final   Creatinine, Ser 04/12/2021 0.79  0.44 - 1.00 mg/dL Final   Calcium 04/12/2021 9.6  8.9 - 10.3 mg/dL Final   Total Protein 04/12/2021 7.8  6.5 - 8.1 g/dL Final   Albumin 04/12/2021 4.5  3.5 - 5.0 g/dL Final   AST 04/12/2021 42 (A) 15 - 41 U/L Final   ALT 04/12/2021 19  0 - 44 U/L Final   Alkaline Phosphatase 04/12/2021 60   38 - 126 U/L Final   Total Bilirubin 04/12/2021 1.2  0.3 - 1.2 mg/dL Final   GFR, Estimated 04/12/2021 >60  >60 mL/min Final   Comment: (NOTE) Calculated using the CKD-EPI Creatinine Equation (2021)    Anion gap 04/12/2021 13  5 - 15 Final   Performed at [facility], [place] [address], [place], [place] [address]   Alcohol, Ethyl (B) 04/12/2021 <10  <10 mg/dL Final   Comment: (NOTE) Lowest detectable limit for serum alcohol is 10 mg/dL.  For medical purposes only. Performed at [facility], [place] [address] [place], [place] [address]    Opiates 04/12/2021 NONE DETECTED  NONE DETECTED Final   Cocaine 04/12/2021 POSITIVE (A) NONE DETECTED Final   Benzodiazepines 04/12/2021 NONE DETECTED  NONE DETECTED Final   Amphetamines 04/12/2021 NONE DETECTED  NONE DETECTED Final   Tetrahydrocannabinol 04/12/2021 POSITIVE (A) NONE DETECTED Final   Barbiturates 04/12/2021 NONE DETECTED  NONE DETECTED Final   Comment: (NOTE) DRUG SCREEN FOR MEDICAL PURPOSES ONLY.  IF CONFIRMATION IS NEEDED FOR ANY PURPOSE, NOTIFY LAB WITHIN 5 DAYS.  LOWEST DETECTABLE LIMITS FOR URINE DRUG SCREEN Drug Class                     Cutoff (ng/mL) Amphetamine and metabolites    1000 Barbiturate and metabolites    200 Benzodiazepine                 010 Tricyclics and metabolites     300 Opiates and metabolites        300 Cocaine and metabolites        300 THC                            50 Performed at [facility], [place] [address] [place], [place] [address]    WBC 04/12/2021 6.0  4.0 - 10.5 K/uL Final   RBC 04/12/2021 3.99  3.87 - 5.11 MIL/uL Final   Hemoglobin 04/12/2021 12.2  12.0 - 15.0 g/dL Final   HCT 04/12/2021 37.6  36.0 - 46.0 % Final   MCV 04/12/2021 94.2  80.0 - 100.0 fL Final   MCH 04/12/2021 30.6  26.0 - 34.0 pg Final   MCHC 04/12/2021 32.4  30.0 - 36.0 g/dL Final   RDW 04/12/2021 13.8  11.5 - 15.5 % Final   Platelets 04/12/2021 220  150 - 400  K/uL Final   nRBC 04/12/2021 0.0  0.0 - 0.2 % Final   Neutrophils Relative % 04/12/2021 65  % Final   Neutro Abs 04/12/2021 3.9  1.7 - 7.7 K/uL Final   Lymphocytes Relative 04/12/2021 20  % Final   Lymphs Abs 04/12/2021 1.2  0.7 - 4.0 K/uL Final   Monocytes Relative 04/12/2021 11  % Final   Monocytes Absolute 04/12/2021 0.7  0.1 - 1.0 K/uL Final   Eosinophils  Relative 04/12/2021 3  % Final   Eosinophils Absolute 04/12/2021 0.2  0.0 - 0.5 K/uL Final   Basophils Relative 04/12/2021 1  % Final   Basophils Absolute 04/12/2021 0.0  0.0 - 0.1 K/uL Final   Immature Granulocytes 04/12/2021 0  % Final   Abs Immature Granulocytes 04/12/2021 0.01  0.00 - 0.07 K/uL Final   Performed at [facility], [place] [address] [place], [place] [address]   I-stat hCG, quantitative 04/12/2021 <5.0  <5 mIU/mL Final   Comment 3 04/12/2021          Final   Comment:   GEST. AGE      CONC.  (mIU/mL)   <=1 WEEK        5 - 50     2 WEEKS       50 - 500     3 WEEKS       100 - 10,000     4 WEEKS     1,000 - 30,000        FEMALE AND NON-PREGNANT FEMALE:     LESS THAN 5 mIU/mL    Color, Urine 04/12/2021 YELLOW  YELLOW Final   APPearance 04/12/2021 HAZY (A) CLEAR Final   Specific Gravity, Urine 04/12/2021 1.021  1.005 - 1.030 Final   pH 04/12/2021 7.0  5.0 - 8.0 Final   Glucose, UA 04/12/2021 NEGATIVE  NEGATIVE mg/dL Final   Hgb urine dipstick 04/12/2021 NEGATIVE  NEGATIVE Final   Bilirubin Urine 04/12/2021 NEGATIVE  NEGATIVE Final   Ketones, ur 04/12/2021 NEGATIVE  NEGATIVE mg/dL Final   Protein, ur 04/12/2021 30 (A) NEGATIVE mg/dL Final   Nitrite 04/12/2021 NEGATIVE  NEGATIVE Final   Leukocytes,Ua 04/12/2021 NEGATIVE  NEGATIVE Final   RBC / HPF 04/12/2021 0-5  0 - 5 RBC/hpf Final   WBC, UA 04/12/2021 0-5  0 - 5 WBC/hpf Final   Bacteria, UA 04/12/2021 RARE (A) NONE SEEN Final   Squamous Epithelial / LPF 04/12/2021 0-5  0 - 5 Final   Mucus 04/12/2021 PRESENT   Final   Hyaline Casts, UA  04/12/2021 PRESENT   Final   Performed at [facility], [place] [address], [place], [place] [address]   Lipase 04/12/2021 35  11 - 51 U/L Final   Performed at [facility], [place] [address], [place], [place] [address]  Admission on 12/05/2020, Discharged on 12/06/2020  Component Date Value Ref Range Status   WBC 12/05/2020 7.8  4.0 - 10.5 K/uL Final   RBC 12/05/2020 4.10  3.87 - 5.11 MIL/uL Final   Hemoglobin 12/05/2020 12.2  12.0 - 15.0 g/dL Final   HCT 12/05/2020 37.6  36.0 - 46.0 % Final   MCV 12/05/2020 91.7  80.0 - 100.0 fL Final   MCH 12/05/2020 29.8  26.0 - 34.0 pg Final   MCHC 12/05/2020 32.4  30.0 - 36.0 g/dL Final   RDW 12/05/2020 14.4  11.5 - 15.5 % Final   Platelets 12/05/2020 214  150 - 400 K/uL Final   nRBC 12/05/2020 0.0  0.0 - 0.2 % Final   Neutrophils Relative % 12/05/2020 70  % Final   Neutro Abs 12/05/2020 5.5  1.7 - 7.7 K/uL Final   Lymphocytes Relative 12/05/2020 19  % Final   Lymphs Abs 12/05/2020 1.5  0.7 - 4.0 K/uL Final   Monocytes Relative 12/05/2020 6  % Final   Monocytes Absolute 12/05/2020 0.5  0.1 - 1.0 K/uL Final   Eosinophils Relative 12/05/2020 4  %  Final   Eosinophils Absolute 12/05/2020 0.3  0.0 - 0.5 K/uL Final   Basophils Relative 12/05/2020 1  % Final   Basophils Absolute 12/05/2020 0.1  0.0 - 0.1 K/uL Final   Immature Granulocytes 12/05/2020 0  % Final   Abs Immature Granulocytes 12/05/2020 0.03  0.00 - 0.07 K/uL Final   Performed at [facility], [place] [address], [place], [place] [address]   Sodium 12/05/2020 140  135 - 145 mmol/L Final   Potassium 12/05/2020 3.2 (A) 3.5 - 5.1 mmol/L Final   Chloride 12/05/2020 104  98 - 111 mmol/L Final   CO2 12/05/2020 26  22 - 32 mmol/L Final   Glucose, Bld 12/05/2020 137 (A) 70 - 99 mg/dL Final   Glucose reference range applies only to samples taken after fasting for at least 8 hours.   BUN 12/05/2020 10  6 - 20 mg/dL Final   Creatinine, Ser 12/05/2020 0.71  0.44 -  1.00 mg/dL Final   Calcium 12/05/2020 9.1  8.9 - 10.3 mg/dL Final   Total Protein 12/05/2020 7.6  6.5 - 8.1 g/dL Final   Albumin 12/05/2020 4.2  3.5 - 5.0 g/dL Final   AST 12/05/2020 56 (A) 15 - 41 U/L Final   ALT 12/05/2020 21  0 - 44 U/L Final   Alkaline Phosphatase 12/05/2020 55  38 - 126 U/L Final   Total Bilirubin 12/05/2020 0.8  0.3 - 1.2 mg/dL Final   GFR, Estimated 12/05/2020 >60  >60 mL/min Final   Comment: (NOTE) Calculated using the CKD-EPI Creatinine Equation (2021)    Anion gap 12/05/2020 10  5 - 15 Final   Performed at [place] [address], [place], [place] [address]   Sed Rate 12/05/2020 3  0 - 22 mm/hr Final   Performed at [place] [address], [place], [place] [address]   CRP 12/05/2020 0.5  <1.0 mg/dL Final   Performed at [place] [address] [place], [place] [address]   Alcohol, Ethyl (B) 12/05/2020 172 (A) <10 mg/dL Final   Comment: (NOTE) Lowest detectable limit for serum alcohol is 10 mg/dL.  For medical purposes only. Performed at [facility], [place] [address], [place], [place] [address]    Lipase 12/05/2020 35  11 - 51 U/L Final   Performed at [place] [address], [place], [place] [address]    Allergies: Patient has no known allergies.  PTA Medications: (Not in a hospital admission)   Medical Decision Making  Admit to [facility] for crisis stabilization  Patient medically cleared in the ED  Discontinue buspirone per patient's request Start hydroxyzine 25 mg TID prn for anxiety  Continue sertraline 100 mg daily for anxiety/PTSD   Initiate CIWA protocol -lorazepam 1 mg oral taper -lorazepam 1 mg every 6 hours prn for CIWA >10 -thiamine 100 mg daily for nutritional supplementation -ondansetron 4 mg ODT every 6 hours prn nausea/vomiting -loperamide 2-4 mg capsule prn diarrhea or loose stools     Clinical Course as of 04/13/21 0844  Wed Apr 13, 2021  0841 Hemoglobin A1C: 5.2 [JB]  0841  SARS Coronavirus 2 by RT PCR: NEGATIVE [JB]  7035 Cholesterol(!): 207 Cholesterol 207, Lipid Panel otherwise unremarkable. [JB]    Clinical Course User Index [JB] Rozetta Nunnery, NP    Recommendations  Based on my evaluation the patient does not appear to have an emergency medical condition.  Rozetta Nunnery, NP 04/13/21  8:44 AM

## 2021-04-13 NOTE — ED Notes (Addendum)
[facility] called to add Lipid panel, Hemoglobin A1C, and TSH. Lab personal states we need to send a gold top for the [facility] Estill Bamberg RN made aware.

## 2021-04-13 NOTE — ED Provider Notes (Signed)
FBC/OBS ASAP Discharge Summary  Date and Time: 04/13/2021 4:24 PM  Name: [name]  MRN:  [id number]   Discharge Diagnoses:  Final diagnoses:  GAD (generalized anxiety disorder)  PTSD (post-traumatic stress disorder)  Alcohol use disorder, severe, dependence ([place])  Alcohol withdrawal syndrome without complication ([place])   [name] is a 45 y.o. female who presented to [facility] on 04/12/2021 requesting alcohol detox. She was transferred to the ED for medical clearance. Patient then transferred back to [facility] after medical clearance for admission to [facility].   Subjective: "I'm much better, no withdrawal symptoms that was my main concern yesterday"  [name], 45 y.o., female patient seen face to face by this provider, consulted with Dr. Hampton Abbot; and chart reviewed on 04/13/21.  On evaluation Phinley A Nahar reports she is feeling much better.  Patient states she would like to go home today so that she can make arrangements for children prior to going to rehab.  Patient reports that she is not having any withdrawal symptoms at this time.  Patient denies suicidal/self-harm/homicidal ideation, psychosis, paranoia.  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  Patient reports family is supportive.  During evaluation [name] is sitting on side of bed in no acute distress.  She is alert, oriented x 4, calm, cooperative and attentive.  Her mood is euthymic with congruent affect.  She has normal speech, and behavior.  Objectively there is no evidence of psychosis/mania or delusional thinking.  Patient is able to converse coherently, goal directed thoughts, no distractibility, or pre-occupation.  She also denies suicidal/self-harm/homicidal ideation, psychosis, and paranoia.  Patient answered question appropriately.     Stay Summary: [name] was admitted to [facility]  for Alcohol use disorder, severe, dependence ([place]) and crisis management.  She was treated with the following medications Ativan detox protocol, Trazodone 50 mg Q hs prn sleep, and restarted her home medication Zoloft 100 mg daily.  Patient reported medications tolerated well with no adverse reaction.  No prescriptions given.  Patient to resume home medications.   [name]'s improvement was monitored by continuous assessment/observation and her report of symptom reduction.  Her emotional and mental status was also monitored by staff.           [name] was evaluated for stability and plans for continued recovery upon discharge.  [name] motivation was an integral factor for scheduling further treatment.  The following was addressed as part of her discharge planning and follow up treatment:  Employment, housing, transportation, bed availability, health status, family support, and any pending legal issues were also considered during her during the continuous assessment/observation.  She was offered further treatment options upon discharge including but not limited to Residential, Intensive Outpatient, Outpatient treatment, Rehabilitation services, and resources for shelters and [facility]-way-house if needed.  [name] will follow up with the services as listed below under Follow up Information.    Upon completion of this admission the [place] was both mentally and medically stable for discharge denying suicidal/homicidal ideation, auditory/visual/tactile hallucinations, delusional thoughts and paranoia.     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     Social History   Substance and Sexual Activity  Drug Use No    Social History   Socioeconomic History   Marital status: Single    Spouse name: Not on file   Number of children: Not on file   Years of education: Not on file   Highest education level: Not on file  Occupational History   Not on file  Tobacco Use   Smoking status: Some Days    Types: Cigarettes   Smokeless tobacco: Never  Vaping Use   Vaping Use: Never used  Substance and Sexual Activity   Alcohol use: Yes    Comment: occ.   Drug use: No   Sexual activity: Yes    Birth control/protection: Implant  Other Topics Concern   Not on file  Social History Narrative   Not on file   Social Determinants of Health   Financial Resource Strain: Not on file  Food Insecurity: Not on file  Transportation Needs: Not on file  Physical Activity: Not on file  Stress: Not on file  Social Connections: Not on file   SDOH:  SDOH Screenings   Alcohol Screen: Not on file  Depression (PHQ2-9): Not on file  Financial Resource Strain: Not on file  Food Insecurity: Not on file  Housing: Not on file  Physical Activity: Not on file  Social Connections: Not on file  Stress: Not on file  Tobacco Use: High Risk   Smoking Tobacco Use: Some Days   Smokeless Tobacco Use: Never  Transportation Needs: Not on file    Tobacco Cessation:  A prescription for an FDA-approved tobacco cessation medication was offered at discharge and the patient refused  Current Medications:  Current Facility-Administered Medications  Medication Dose Route Frequency Provider Last Rate Last Admin    acetaminophen (TYLENOL) tablet 650 mg  650 mg Oral Q6H PRN Rozetta Nunnery, NP   650 mg at 04/13/21 1055   alum & mag hydroxide-simeth (MAALOX/MYLANTA) 200-200-20 MG/5ML suspension 30 mL  30 mL Oral Q4H PRN Lindon Romp A, NP       hydrOXYzine (ATARAX/VISTARIL) tablet 25 mg  25 mg Oral TID PRN Rozetta Nunnery, NP       loperamide (IMODIUM) capsule 2-4 mg  2-4 mg Oral PRN Lindon Romp A, NP       LORazepam (ATIVAN) tablet 1 mg  1 mg Oral Q6H PRN Lindon Romp A, NP       LORazepam (ATIVAN) tablet 1 mg  1 mg Oral QID Lindon Romp A, NP   1 mg at 04/13/21 1428   Followed by   Derrill Memo ON 04/14/2021] LORazepam (ATIVAN) tablet 1 mg  1 mg Oral TID Rozetta Nunnery, NP       Followed by   Derrill Memo ON 04/15/2021] LORazepam (ATIVAN) tablet 1 mg  1 mg Oral BID Rozetta Nunnery, NP       Followed by   Derrill Memo ON 04/17/2021] LORazepam (ATIVAN)  tablet 1 mg  1 mg Oral Daily Lindon Romp A, NP       magnesium hydroxide (MILK OF MAGNESIA) suspension 30 mL  30 mL Oral Daily PRN Rozetta Nunnery, NP       multivitamin with minerals tablet 1 tablet  1 tablet Oral Daily Lindon Romp A, NP   1 tablet at 04/13/21 0930   nicotine (NICODERM CQ - dosed in mg/24 hours) patch 14 mg  14 mg Transdermal Daily Hashem Goynes B, NP   14 mg at 04/13/21 0931   ondansetron (ZOFRAN-ODT) disintegrating tablet 4 mg  4 mg Oral Q6H PRN Rozetta Nunnery, NP       sertraline (ZOLOFT) tablet 100 mg  100 mg Oral Daily Lindon Romp A, NP   100 mg at 04/13/21 0930   [START ON 04/14/2021] thiamine tablet 100 mg  100 mg Oral Daily Lindon Romp A, NP       traZODone (DESYREL) tablet 50 mg  50 mg Oral QHS PRN Rozetta Nunnery, NP       Current Outpatient Medications  Medication Sig Dispense Refill   busPIRone (BUSPAR) 15 MG tablet Take 15 mg by mouth 3 (three) times daily.     ibuprofen (ADVIL) 200 MG tablet Take 400 mg by mouth every 6 (six) hours as needed for headache or moderate pain.     Multiple Vitamin (MULTIVITAMIN WITH MINERALS) TABS tablet Take 1 tablet  by mouth daily.     sertraline (ZOLOFT) 100 MG tablet Take 100 mg by mouth daily.     traZODone (DESYREL) 50 MG tablet Take 50 mg by mouth at bedtime as needed for sleep.     Vitamin D, Ergocalciferol, (DRISDOL) 1.25 MG (50000 UNIT) CAPS capsule Take 50,000 Units by mouth every Monday.      PTA Medications: (Not in a hospital admission)   Musculoskeletal  Strength & Muscle Tone: within normal limits Gait & Station: normal Patient leans: N/A  Psychiatric Specialty Exam  Presentation  General Appearance: Appropriate for Environment  Eye Contact:Good  Speech:Clear and Coherent; Normal Rate  Speech Volume:Normal  Handedness: No data recorded  Mood and Affect  Mood:Euthymic  Affect:Appropriate; Congruent   Thought Process  Thought Processes:Coherent; Goal Directed  Descriptions of Associations:Intact  Orientation:Full (Time, Place and Person)  Thought Content:WDL; Logical     Hallucinations:Hallucinations: None  Ideas of Reference:None  Suicidal Thoughts:Suicidal Thoughts: No  Homicidal Thoughts:Homicidal Thoughts: No   Sensorium  Memory:Immediate Good; Recent Good; Remote Good  Judgment:Intact  Insight:Present   Executive Functions  Concentration:Good  Attention Span:Good  [place] of Knowledge:Good  Language:Good   Psychomotor Activity  Psychomotor Activity:Psychomotor Activity: Normal   Assets  Assets:Communication Skills; Desire for Improvement; Financial Resources/Insurance; Housing; Physical Health; Social Support   Sleep  Sleep:Sleep: Good   Nutritional Assessment (For OBS and FBC admissions only) Has the patient had a weight loss or gain of 10 pounds or more in the last 3 months?: Yes Has the patient had a decrease in food intake/or appetite?: Yes Does the patient have dental problems?: No Does the patient have eating habits or behaviors that may be indicators of an eating disorder including binging or inducing  vomiting?: No Has the patient recently lost weight without trying?: 1 Has the patient been eating poorly because of a decreased appetite?: 1 Malnutrition Screening Tool Score: 2 Nutritional Assessment Referrals: Other (comment) (Weight loss related to alcohol use and patient taking action to stop drinkin)   Physical Exam  Physical  Exam Vitals and nursing note reviewed. Exam conducted with a chaperone present.  Constitutional:      General: She is not in acute distress.    Appearance: Normal appearance. She is not ill-appearing.  Cardiovascular:     Rate and Rhythm: Normal rate.  Pulmonary:     Effort: Pulmonary effort is normal.  Musculoskeletal:        General: Normal range of motion.     Cervical back: Normal range of motion.  Skin:    General: Skin is warm and dry.  Neurological:     Mental Status: She is alert and oriented to person, place, and time.  Psychiatric:        Attention and Perception: Attention and perception normal. She does not perceive auditory or visual hallucinations.        Mood and Affect: Mood and affect normal.        Speech: Speech normal.        Behavior: Behavior normal. Behavior is cooperative.        Thought Content: Thought content normal. Thought content is not paranoid or delusional. Thought content does not include homicidal or suicidal ideation.        Cognition and Memory: Cognition and memory normal.        Judgment: Judgment normal.   Review of Systems  Constitutional: Negative.   HENT: Negative.    Eyes: Negative.   Respiratory: Negative.    Cardiovascular: Negative.   Gastrointestinal: Negative.   Genitourinary: Negative.   Musculoskeletal: Negative.   Skin: Negative.   Neurological: Negative.   Endo/Heme/Allergies: Negative.   Psychiatric/Behavioral:  Positive for suicidal ideas (Denies). Depression: Stable. Hallucinations: Denies. Substance abuse: Alcohol.The patient does not have insomnia. Nervous/anxious: Stable.       Patient  states she has children at home and would like to go home prior to going to rehab to make sure everything is arranged.   Blood pressure (!) 147/99, pulse 62, temperature 97.9 F (36.6 C), temperature source Oral, resp. rate 18, SpO2 100 %. There is no height or weight on file to calculate BMI.  Demographic Factors:  Black female  Loss Factors: None  Historical Factors: Alcohol abuse  Risk Reduction Factors:   Responsible for children under 64 years of age, Sense of responsibility to family, Religious beliefs about death, Employed, Living with another person, especially a relative, and Positive social support  Continued Clinical Symptoms:  Alcohol/Substance Abuse/Dependencies  Cognitive Features That Contribute To Risk:  None    Suicide Risk:  Minimal: No identifiable suicidal ideation.  Patients presenting with no risk factors but with morbid ruminations; may be classified as minimal risk based on the severity of the depressive symptoms  Plan Of Care/Follow-up recommendations:  Activity:  Resume normal activity  Disposition: No evidence of imminent risk to self or others at present.   Patient does not meet criteria for psychiatric inpatient admission. Supportive therapy provided about ongoing stressors. Discussed crisis plan, support from social network, calling 911, coming to the Emergency Department, and calling Suicide Hotline. Referred to Rehab facility  Allergies as of 04/13/2021   No Known Allergies      Medication List     TAKE these medications    busPIRone 15 MG tablet Commonly known as: BUSPAR Take 15 mg by mouth 3 (three) times daily.   ibuprofen 200 MG tablet Commonly known as: ADVIL Take 400 mg by mouth every 6 (six) hours as needed for headache or moderate pain.   multivitamin with  minerals Tabs tablet Take 1 tablet by mouth daily.   sertraline 100 MG tablet Commonly known as: ZOLOFT Take 100 mg by mouth daily.   traZODone 50 MG  tablet Commonly known as: DESYREL Take 50 mg by mouth at bedtime as needed for sleep.   Vitamin D (Ergocalciferol) 1.25 MG (50000 UNIT) Caps capsule Commonly known as: DRISDOL Take 50,000 Units by mouth every Monday.         Discharge Instructions   None      Follow-up Information     Services, [facility]. Call.   Why: A referral was made on your behalf. Please contact on a daily basis to determine bed availability. Be sure to have a Photo ID, 30 day samples of medications, and any discharge paperwork from this encounter. Contact information: [address] [place] [address] [phone]         [facility]. Go to.   Specialty: Behavioral Health Why: Please go during walk-in hours to establish outpatient psychiatric services.  Therapy Walk-in hours are Monday-Wednesday from 8:00am-until slots are filled. Please be sure to arrive by 7:30am-7:45am as patients are seen on a first come, first served basis. Friday hours are from 1:00pm-5:00pm   Medication Management Walk-in hours are Monday-Friday from 8:00am-11:00am. Please be sure to arrive by 7:30am-7:45am as patients are seen on a first come, first served basis. Contact information: [place] [address] [place], [place] 04/13/2021, 4:24 PM

## 2021-04-13 NOTE — ED Notes (Signed)
Patient A&O x 4, ambulatory. Patient discharged in no acute distress. Patient denied SI/HI, A/VH upon discharge. Patient verbalized understanding of all discharge instructions explained by staff, to include follow up appointments, medications and safety plan. Patient reported mood 10/10.  Pt belongings returned to patient from locker intact. Patient escorted to lobby via staff for transport to destination via her daughter. Safety maintained.

## 2021-04-13 NOTE — ED Notes (Addendum)
Pt admitted to [facility] requesting detox from alcohol. Pt A&O x4, calm and cooperative. Pt denies SI/HI/AVH. Pt tolerated admission process and skin assessment well. Pt ambulated independently to unit. Oriented to unit/staff. Pt declined to have more blood taken at this time. Pt denies any immediate needs at this time. No signs of acute distress noted. Will continue to monitor for safety.

## 2021-04-13 NOTE — ED Notes (Signed)
[name]&Ox4. Denies intent to harm self/others when asked. Denies [place]/VH. C/O increased anxiety. Pt states, "I'm not sure if my anxiety is coming from the ETOH or just being here. I know I needed help when I started trembling and sweating at home. My body was craving it. Then the drinking just got out of control. I lost my baby to Livingston miscarriage and I started using that as an excuse to drink. That was in 2008. I have to accept responsibility that that's all it was, an excuse".  Support and encouragement provided. Pt states having "internal tremors", none observed. Routine safety checks conducted according to facility protocol. Encouraged Michelle to notify staff if thoughts of harm toward self or others arise. Michelle verbalize understanding and agreement. Michelle verbally contracts for safety. Will continue to monitor.

## 2021-04-13 NOTE — ED Notes (Signed)
Pt requesting to discharge today. Pt states, "I want to leave after I get my 1400 medication. I will sign myself in somewhere but I can do this from home. It is my right to leave since I came in voluntary". RN attempted to encourage pt to complete plan of care for residential stay but pt decline. NP notified. Safety maintained.

## 2021-04-13 NOTE — Group Note (Signed)
Group Topic: Understanding Self  Group Date: 04/13/2021 Start Time: 1030 End Time: 1125 Facilitators: Adaline Sill D, NT  Department: [facility]  Number of Participants: 4  Group Focus: acceptance, affirmation, communication, coping skills, daily focus, family, feeling awareness/expression, healthy friendships, safety plan, and self-esteem Treatment Modality:  Cognitive Behavioral Therapy and Solution-Focused Therapy Interventions utilized were clarification, patient education, story telling, and support Purpose: enhance coping skills, express feelings, improve communication skills, increase insight, regain self-worth, and reinforce self-care  Name: [name] Date of Birth: 05/08/1976  MR: [id number]    Level of Participation: minimal Quality of Participation: attentive Interactions with others: gave feedback Mood/Affect: appropriate Triggers (if applicable):  Cognition: concrete, goal directed, and insightful Progress: Moderate Response:  Plan: patient will be encouraged to set daily goal, find positive affirmation, and positive coping skill.   Patients Problems:  Patient Active Problem List   Diagnosis Date Noted   Alcohol use disorder, severe, dependence ([place]) 04/12/2021

## 2021-05-08 ENCOUNTER — Encounter (HOSPITAL_COMMUNITY): Payer: Self-pay | Admitting: Student

## 2021-05-08 ENCOUNTER — Emergency Department (HOSPITAL_COMMUNITY): Payer: Medicaid Other

## 2021-05-08 ENCOUNTER — Other Ambulatory Visit: Payer: Self-pay

## 2021-05-08 ENCOUNTER — Emergency Department (HOSPITAL_COMMUNITY)
Admission: EM | Admit: 2021-05-08 | Discharge: 2021-05-08 | Disposition: A | Discharge status: Home / Self Care | Payer: Medicaid Other | Attending: Emergency Medicine | Admitting: Emergency Medicine

## 2021-05-08 DIAGNOSIS — K529 Noninfective gastroenteritis and colitis, unspecified: Secondary | ICD-10-CM | POA: Insufficient documentation

## 2021-05-08 DIAGNOSIS — F17210 Nicotine dependence, cigarettes, uncomplicated: Secondary | ICD-10-CM | POA: Insufficient documentation

## 2021-05-08 DIAGNOSIS — R112 Nausea with vomiting, unspecified: Secondary | ICD-10-CM

## 2021-05-08 DIAGNOSIS — R197 Diarrhea, unspecified: Secondary | ICD-10-CM

## 2021-05-08 DIAGNOSIS — R1031 Right lower quadrant pain: Secondary | ICD-10-CM | POA: Diagnosis present

## 2021-05-08 LAB — BASIC METABOLIC PANEL
Anion gap: 10 (ref 5–15)
BUN: 11 mg/dL (ref 6–20)
CO2: 23 mmol/L (ref 22–32)
Calcium: 8.1 mg/dL — ABNORMAL LOW (ref 8.9–10.3)
Chloride: 104 mmol/L (ref 98–111)
Creatinine, Ser: 0.57 mg/dL (ref 0.44–1.00)
GFR, Estimated: >60 mL/min (ref >60)
Glucose, Bld: 132 mg/dL — ABNORMAL HIGH (ref 70–99)
Potassium: 3 mmol/L — ABNORMAL LOW (ref 3.5–5.1)
Sodium: 137 mmol/L (ref 135–145)

## 2021-05-08 LAB — LIPASE, BLOOD: Lipase: 32 U/L (ref 11–51)

## 2021-05-08 LAB — URINALYSIS, ROUTINE W REFLEX MICROSCOPIC
Glucose, UA: 150 mg/dL — AB
Ketones, ur: 80 mg/dL — AB
Nitrite: NEGATIVE
Protein, ur: >=300 mg/dL — AB
Specific Gravity, Urine: 1.032 — ABNORMAL HIGH (ref 1.005–1.030)
pH: 6 (ref 5.0–8.0)

## 2021-05-08 LAB — CBC WITH DIFFERENTIAL/PLATELET
Abs Immature Granulocytes: 0.02 10*3/uL (ref 0.00–0.07)
Basophils Absolute: 0 10*3/uL (ref 0.0–0.1)
Basophils Relative: 0 %
Eosinophils Absolute: 0 10*3/uL (ref 0.0–0.5)
Eosinophils Relative: 0 %
HCT: 35.5 % — ABNORMAL LOW (ref 36.0–46.0)
Hemoglobin: 12.4 g/dL (ref 12.0–15.0)
Immature Granulocytes: 0 %
Lymphocytes Relative: 6 %
Lymphs Abs: 0.4 10*3/uL — ABNORMAL LOW (ref 0.7–4.0)
MCH: 31.2 pg (ref 26.0–34.0)
MCHC: 34.9 g/dL (ref 30.0–36.0)
MCV: 89.2 fL (ref 80.0–100.0)
Monocytes Absolute: 0.6 10*3/uL (ref 0.1–1.0)
Monocytes Relative: 8 %
Neutro Abs: 6.7 10*3/uL (ref 1.7–7.7)
Neutrophils Relative %: 86 %
Platelets: 227 10*3/uL (ref 150–400)
RBC: 3.98 MIL/uL (ref 3.87–5.11)
RDW: 12.1 % (ref 11.5–15.5)
WBC: 7.8 10*3/uL (ref 4.0–10.5)
nRBC: 0 % (ref 0.0–0.2)

## 2021-05-08 LAB — COMPREHENSIVE METABOLIC PANEL
ALT: 22 U/L (ref 0–44)
AST: 45 U/L — ABNORMAL HIGH (ref 15–41)
Albumin: 4.5 g/dL (ref 3.5–5.0)
Alkaline Phosphatase: 65 U/L (ref 38–126)
Anion gap: 19 — ABNORMAL HIGH (ref 5–15)
BUN: 13 mg/dL (ref 6–20)
CO2: 20 mmol/L — ABNORMAL LOW (ref 22–32)
Calcium: 9.6 mg/dL (ref 8.9–10.3)
Chloride: 96 mmol/L — ABNORMAL LOW (ref 98–111)
Creatinine, Ser: 0.79 mg/dL (ref 0.44–1.00)
GFR, Estimated: >60 mL/min (ref >60)
Glucose, Bld: 245 mg/dL — ABNORMAL HIGH (ref 70–99)
Potassium: 3 mmol/L — ABNORMAL LOW (ref 3.5–5.1)
Sodium: 135 mmol/L (ref 135–145)
Total Bilirubin: 2.6 mg/dL — ABNORMAL HIGH (ref 0.3–1.2)
Total Protein: 8.2 g/dL — ABNORMAL HIGH (ref 6.5–8.1)

## 2021-05-08 LAB — I-STAT BETA HCG BLOOD, ED (MC, WL, AP ONLY): I-stat hCG, quantitative: <5 m[IU]/mL (ref <5)

## 2021-05-08 LAB — TYPE AND SCREEN
ABO/RH(D): B POS
Antibody Screen: NEGATIVE

## 2021-05-08 LAB — POC OCCULT BLOOD, ED: Fecal Occult Bld: NEGATIVE

## 2021-05-08 IMAGING — CT CT ABD-PELV W/ CM
2 of 5 series · 16 of 46 positions shown, 18 images · IV contrast (OMNIPAQUE 350)
Comparison: [DATE].

CLINICAL DATA: Acute generalized abdominal pain.

EXAM:
CT ABDOMEN AND PELVIS WITH CONTRAST
TECHNIQUE: Multidetector CT imaging of the abdomen and pelvis was performed
using the standard protocol following bolus administration of
intravenous contrast.
CONTRAST:  60mL OMNIPAQUE IOHEXOL 350 MG/ML SOLN

[Series 2: axial st · axial · 0.68mm/px · z∈[+1049,+1379]mm · 13 of 78 slices shown, 15 images]
[im 6/78  soft-tissue]
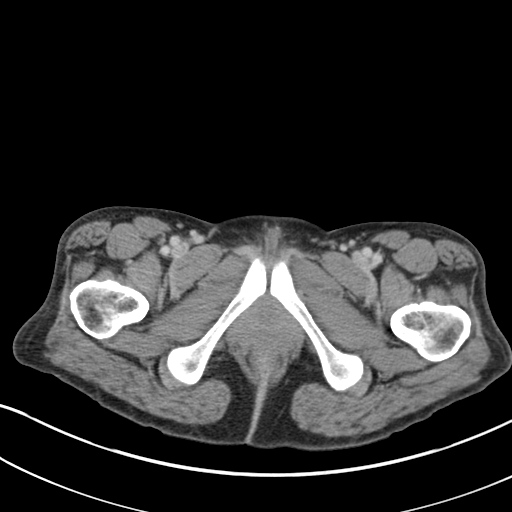
[im 6/78  bone]
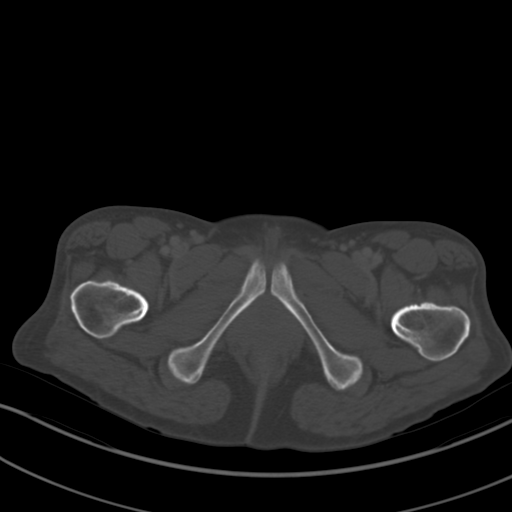
[im 12/78  soft-tissue]
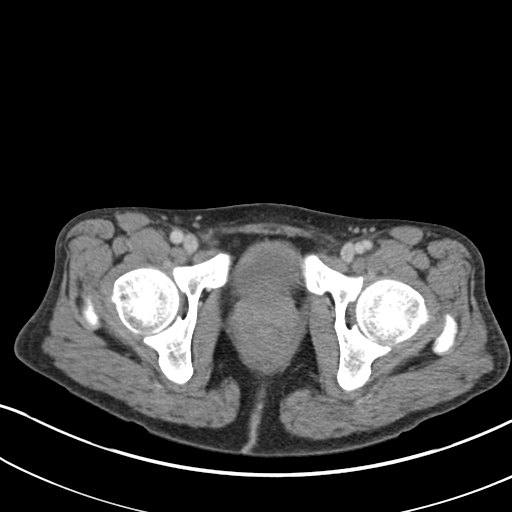
[im 17/78  soft-tissue]
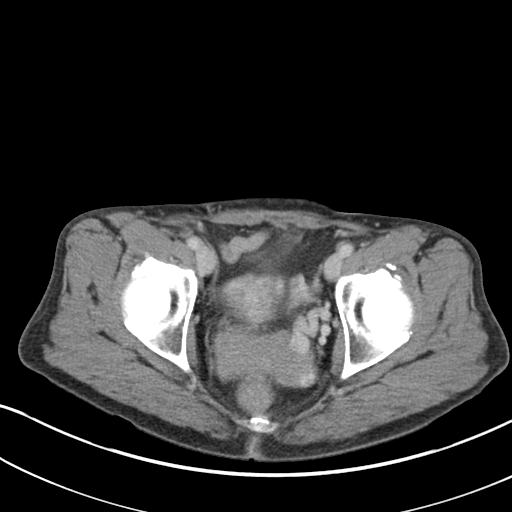
[im 23/78  soft-tissue]
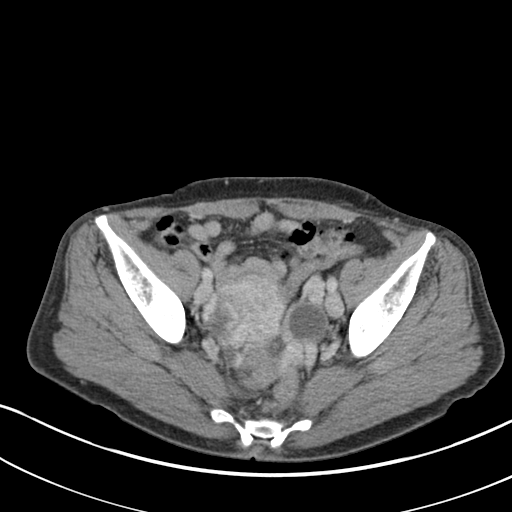
[im 28/78  soft-tissue]
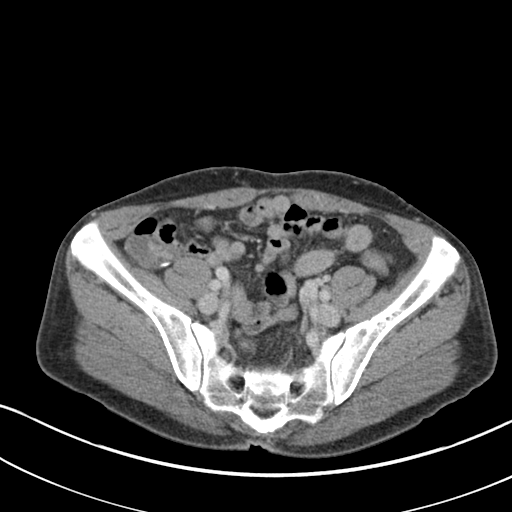
[im 34/78  soft-tissue]
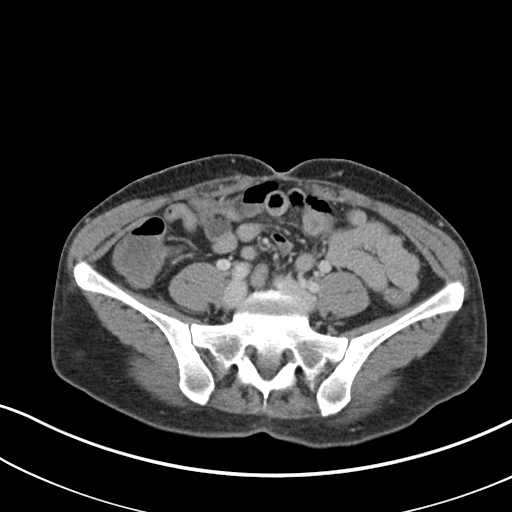
[im 39/78  soft-tissue]
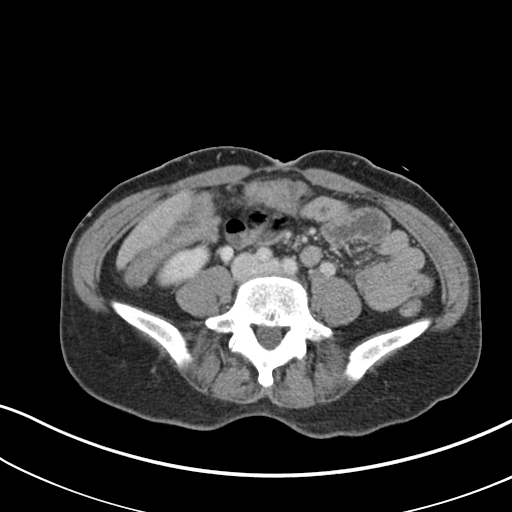
[im 45/78  soft-tissue]
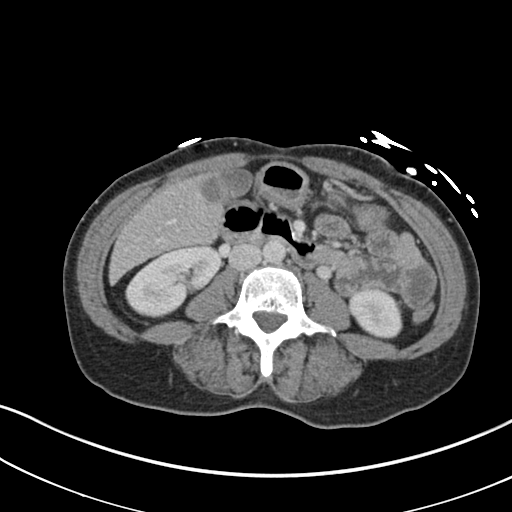
[im 50/78  soft-tissue]
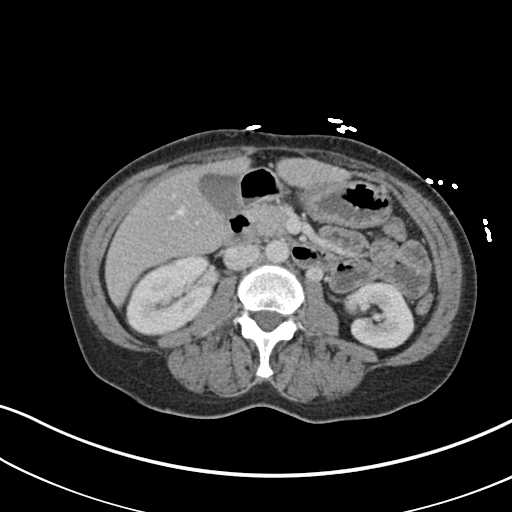
[im 50/78  bone]
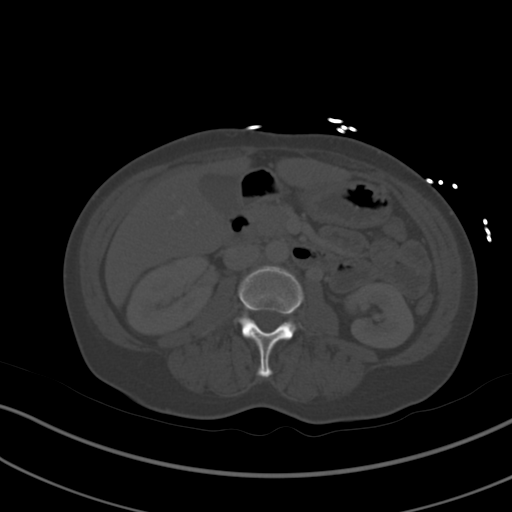
[im 56/78  soft-tissue]
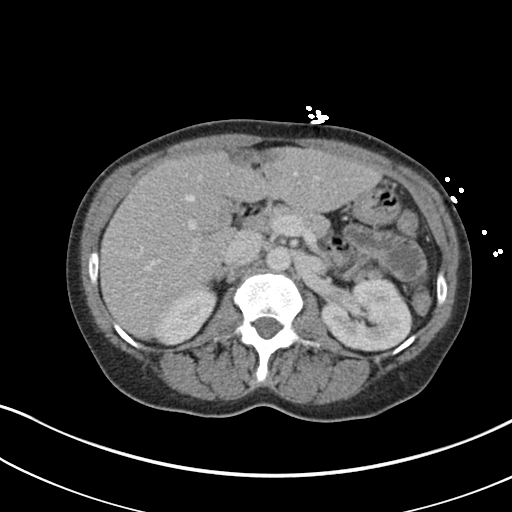
[im 61/78  soft-tissue]
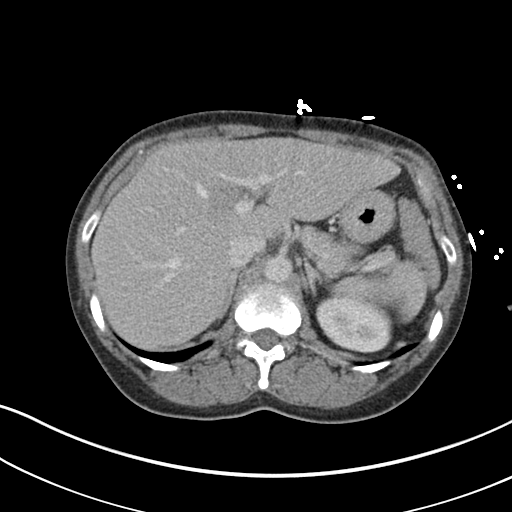
[im 67/78  soft-tissue]
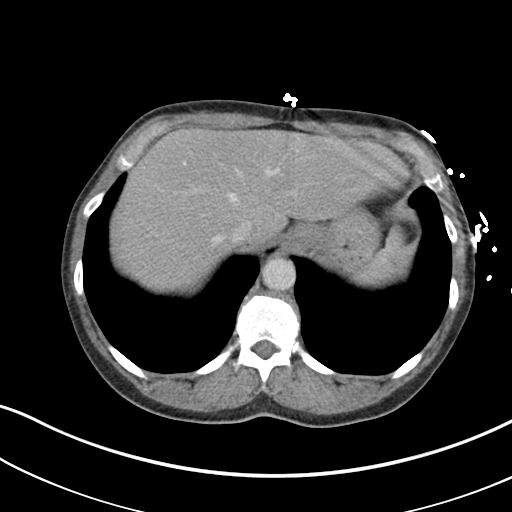
[im 72/78  soft-tissue]
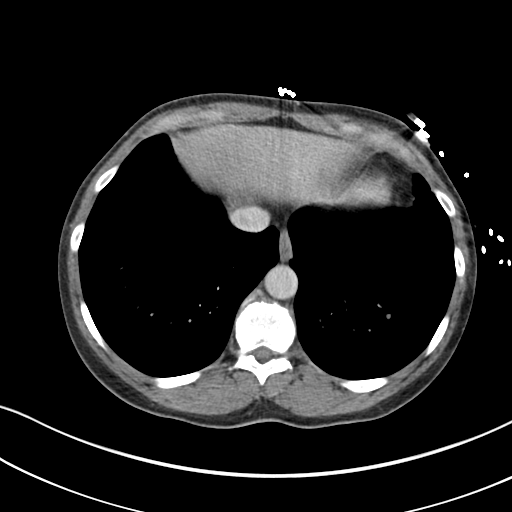

[Series 5: coronal st · coronal · 0.73mm/px · 3 of 124 slices shown]
[im 42/124  soft-tissue]
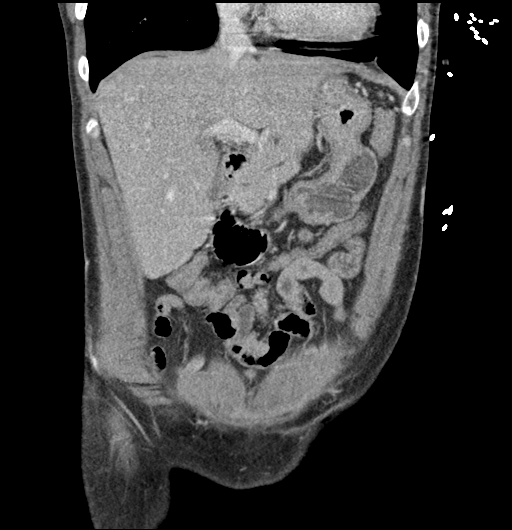
[im 55/124  soft-tissue]
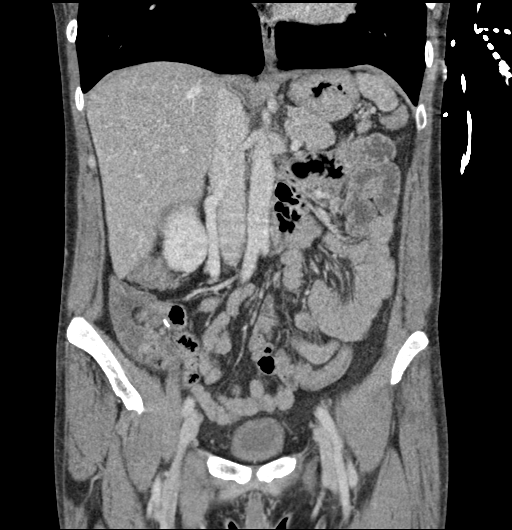
[im 69/124  soft-tissue]
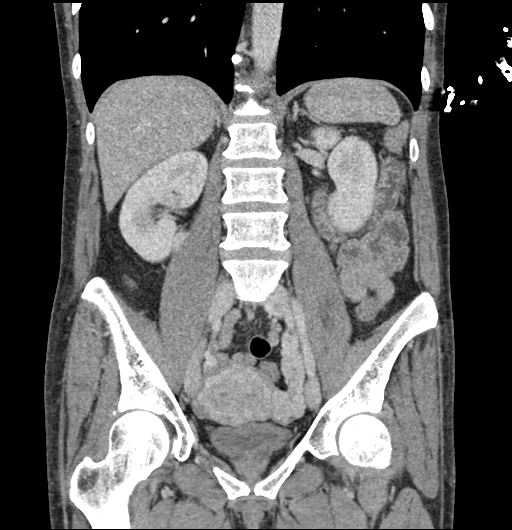

[16 of 46 positions shown; findings below may reference images not displayed]

FINDINGS: Lower chest: No acute abnormality.

Hepatobiliary: No focal liver abnormality is seen. No gallstones,
gallbladder wall thickening, or biliary dilatation.

Pancreas: Unremarkable. No pancreatic ductal dilatation or
surrounding inflammatory changes.

Spleen: Normal in size without focal abnormality.

Adrenals/Urinary Tract: Adrenal glands are unremarkable. Kidneys are
normal, without renal calculi, focal lesion, or hydronephrosis.
Bladder is unremarkable.

Stomach/Bowel: The stomach appears normal. There is no evidence of
bowel obstruction or inflammation. Status post appendectomy.

Vascular/Lymphatic: No adenopathy is noted. Enlarged bilateral
pelvic varices are noted with enlarged bilateral ovarian veins
suggesting pelvic congestion syndrome.

Reproductive: Uterus is unremarkable.  Ovaries are unremarkable.

Other: No abdominal wall hernia or abnormality. No abdominopelvic
ascites.

Musculoskeletal: No acute or significant osseous findings.
IMPRESSION: Enlarged bilateral pelvic varices are noted with enlarged bilateral
ovarian veins suggesting pelvic congestion syndrome.

No other abnormality seen in the abdomen or pelvis.

## 2021-05-08 MED ORDER — SODIUM CHLORIDE 0.9 % IV BOLUS
1000.0000 mL | Freq: Once | INTRAVENOUS | Status: AC
  Administered 2021-05-08: 1000 mL via INTRAVENOUS

## 2021-05-08 MED ORDER — MORPHINE SULFATE (PF) 4 MG/ML IV SOLN
4.0000 mg | Freq: Once | INTRAVENOUS | Status: AC
  Administered 2021-05-08: 4 mg via INTRAVENOUS
  Filled 2021-05-08: qty 1

## 2021-05-08 MED ORDER — IOHEXOL 350 MG/ML SOLN
60.0000 mL | Freq: Once | INTRAVENOUS | Status: AC | PRN
  Administered 2021-05-08: 60 mL via INTRAVENOUS

## 2021-05-08 MED ORDER — ONDANSETRON 4 MG PO TBDP: 4.0000 mg | ORAL_TABLET | Freq: Three times a day (TID) | ORAL | 0 refills | Status: DC | PRN

## 2021-05-08 MED ORDER — ONDANSETRON HCL 4 MG/2ML IJ SOLN
4.0000 mg | Freq: Once | INTRAMUSCULAR | Status: AC
Start: 2021-05-08 — End: 2021-05-08
  Administered 2021-05-08: 4 mg via INTRAVENOUS
  Filled 2021-05-08: qty 2

## 2021-05-08 MED ORDER — POTASSIUM CHLORIDE CRYS ER 20 MEQ PO TBCR
40.0000 meq | EXTENDED_RELEASE_TABLET | Freq: Once | ORAL | Status: AC
  Administered 2021-05-08: 40 meq via ORAL
  Filled 2021-05-08: qty 2

## 2021-05-08 MED ORDER — LIDOCAINE VISCOUS HCL 2 % MT SOLN
15.0000 mL | Freq: Once | OROMUCOSAL | Status: AC
  Administered 2021-05-08: 15 mL via OROMUCOSAL
  Filled 2021-05-08: qty 15

## 2021-05-08 NOTE — ED Triage Notes (Addendum)
Patient BIB [facility] c/o abdominal pain and N/V that started last night after eating a hamburger steak.  EMS placed 20 left AC. Patient does not have hx of diabetes.  Patient has hx of ETOH abuse, HTN, and anxiety.  Was recently at [facility].  Patient told EMS her last drink was Thursday.  170/100 100-HR 40-RR 305-CBG

## 2021-05-08 NOTE — ED Notes (Signed)
Called lab and had Urine Culture added on.

## 2021-05-08 NOTE — ED Provider Notes (Signed)
[facility] DE[facility] Provider Note   CSN: [id number] Arrival date & time: 05/08/21  1237     History Chief Complaint  Patient presents with   Emesis   Nausea   Abdominal Pain    [name] is a 45 y.o. female.  with history of alcohol use disorder who presents emergency department with abdominal pain, nausea, vomiting and diarrhea.  She states that symptoms began yesterday evening with right lower quadrant abdominal pain associated with nausea, vomiting and small amounts of diarrhea.  She states that he she has had "many" episodes of vomiting and her last episode was associated with "burgundy" colored emesis.  She states that she believes there "may have been blood" in her diarrhea.  She denies abdominal distention, fever, sick contacts, recent travel, chest pain or shortness of breath.   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[facility])     Patient Active Problem List   Diagnosis Date Noted   Alcohol withdrawal ([place]) 04/13/2021   Alcohol use disorder, severe, dependence ([place]) 04/12/2021    Past Surgical History:  Procedure Laterality Date   APPENDECTOMY     CESAREAN SECTION       OB History     Gravida  4   Para  3   Term  3   Preterm      AB  1   Living  3      SAB      IAB      Ectopic      Multiple      Live Births  4           Family History  Problem Relation Age of Onset   Hypertension Mother    Breast cancer Mother    Diabetes Father    Hypertension Father    Breast cancer Paternal Grandmother     Social History   Tobacco Use   Smoking status: Some  Days    Types: Cigarettes   Smokeless tobacco: Never  Vaping Use   Vaping Use: Never used  Substance Use Topics   Alcohol use: Yes    Comment: occ.   Drug use: No    Home Medications Prior to Admission medications   Medication Sig Start Date End Date Taking? Authorizing Provider  busPIRone (BUSPAR) 15 MG tablet Take 15 mg by mouth 3 (three) times daily. 04/06/21   [provider]  ibuprofen (ADVIL) 200 MG tablet Take 400 mg by mouth every 6 (six) hours as needed for headache or moderate pain.    [provider]  Multiple Vitamin (MULTIVITAMIN WITH MINERALS) TABS tablet Take 1 tablet by mouth daily.    [provider]  sertraline (ZOLOFT) 100 MG tablet Take 100 mg by mouth daily. 06/22/20   [provider]  traZODone (DESYREL) 50 MG tablet Take 50 mg by mouth at bedtime as needed for sleep. 11/25/18   [provider]  Vitamin D, Ergocalciferol, (DRISDOL) 1.25 MG (50000 UNIT) CAPS capsule Take 50,000 Units by mouth every Monday. 04/05/21   [provider]    Allergies  Patient has no known allergies.  Review of Systems   Review of Systems  Constitutional:  Positive for appetite change. Negative for fever.  Respiratory:  Negative for shortness of breath.   Cardiovascular:  Negative for chest pain.  Gastrointestinal:  Positive for abdominal pain, blood in stool, diarrhea, nausea and vomiting.  Genitourinary:  Negative for dysuria.  All other systems reviewed and are negative.  Physical Exam Updated Vital Signs BP (!) 162/80 (BP Location: Right Arm)   Pulse 76   Temp 98 F (36.7 C) (Oral)   Resp 18   Ht 5\' 3"  (1.6 m)   Wt 51 kg   SpO2 98%   BMI 19.92 kg/m   Physical Exam Vitals and nursing note reviewed.  Constitutional:      General: She is in acute distress.     Appearance: Normal appearance. She is well-developed. She is not toxic-appearing.  HENT:     Head: Normocephalic and atraumatic.     Mouth/Throat:      Mouth: Mucous membranes are moist.     Pharynx: Posterior oropharyngeal erythema present.  Eyes:     General: No scleral icterus.    Extraocular Movements: Extraocular movements intact.     Pupils: Pupils are equal, round, and reactive to light.  Cardiovascular:     Rate and Rhythm: Normal rate and regular rhythm.     Pulses: Normal pulses.     Heart sounds: Normal heart sounds. No murmur heard. Pulmonary:     Effort: Pulmonary effort is normal. No respiratory distress.     Breath sounds: Normal breath sounds.  Abdominal:     General: Bowel sounds are increased. There is no distension.     Palpations: Abdomen is soft.     Tenderness: There is generalized abdominal tenderness and tenderness in the right upper quadrant and left lower quadrant. There is guarding. There is no right CVA tenderness, left CVA tenderness or rebound.     Hernia: No hernia is present.  Musculoskeletal:        General: Normal range of motion.     Cervical back: Normal range of motion.  Skin:    General: Skin is warm and dry.     Capillary Refill: Capillary refill takes less than 2 seconds.  Neurological:     General: No focal deficit present.     Mental Status: She is alert and oriented to person, place, and time.  Psychiatric:        Mood and Affect: Mood normal.        Behavior: Behavior normal.    ED Results / Procedures / Treatments   Labs (all labs ordered are listed, but only abnormal results are displayed) Labs Reviewed  COMPREHENSIVE METABOLIC PANEL - Abnormal; Notable for the following components:      Result Value   Potassium 3.0 (*)    Chloride 96 (*)    CO2 20 (*)    Glucose, Bld 245 (*)    Total Protein 8.2 (*)    AST 45 (*)    Total Bilirubin 2.6 (*)    Anion gap 19 (*)    All other components within normal limits  CBC WITH DIFFERENTIAL/PLATELET - Abnormal; Notable for the following components:   HCT 35.5 (*)    Lymphs Abs 0.4 (*)    All other components within normal limits   URINALYSIS, ROUTINE W REFLEX MICROSCOPIC - Abnormal; Notable for the following components:   Color, Urine AMBER (*)    APPearance CLOUDY (*)  Specific Gravity, Urine 1.032 (*)    Glucose, UA 150 (*)    Hgb urine dipstick MODERATE (*)    Bilirubin Urine SMALL (*)    Ketones, ur 80 (*)    Protein, ur >=300 (*)    Leukocytes,Ua MODERATE (*)    Bacteria, UA RARE (*)    All other components within normal limits  BASIC METABOLIC PANEL - Abnormal; Notable for the following components:   Potassium 3.0 (*)    Glucose, Bld 132 (*)    Calcium 8.1 (*)    All other components within normal limits  URINE CULTURE  LIPASE, BLOOD  I-STAT BETA HCG BLOOD, ED ([facility], [facility], [facility] ONLY)  POC OCCULT BLOOD, ED  TYPE AND SCREEN   EKG None  Radiology CT ABDOMEN PELVIS W CONTRAST  Result Date: 05/08/2021 CLINICAL DATA:  Acute generalized abdominal pain. EXAM: CT ABDOMEN AND PELVIS WITH CONTRAST TECHNIQUE: Multidetector CT imaging of the abdomen and pelvis was performed using the standard protocol following bolus administration of intravenous contrast. CONTRAST:  29mL OMNIPAQUE IOHEXOL 350 MG/ML SOLN COMPARISON:  December 20, 2018. FINDINGS: Lower chest: No acute abnormality. Hepatobiliary: No focal liver abnormality is seen. No gallstones, gallbladder wall thickening, or biliary dilatation. Pancreas: Unremarkable. No pancreatic ductal dilatation or surrounding inflammatory changes. Spleen: Normal in size without focal abnormality. Adrenals/Urinary Tract: Adrenal glands are unremarkable. Kidneys are normal, without renal calculi, focal lesion, or hydronephrosis. Bladder is unremarkable. Stomach/Bowel: The stomach appears normal. There is no evidence of bowel obstruction or inflammation. Status post appendectomy. Vascular/Lymphatic: No adenopathy is noted. 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 Reproductive: Uterus is unremarkable.  Ovaries  are unremarkable. Other: No abdominal wall hernia or abnormality. No abdominopelvic ascites. Musculoskeletal: No acute or significant osseous findings. 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 No other abnormality seen in the abdomen or pelvis. Electronically Signed   By: Marijo Conception M.D.   On: 05/08/2021 15:03    Procedures Procedures   Medications Ordered in ED Medications  ondansetron (ZOFRAN) injection 4 mg (4 mg Intravenous Given 05/08/21 1307)  morphine 4 MG/ML injection 4 mg (4 mg Intravenous Given 05/08/21 1308)  sodium chloride 0.9 % bolus 1,000 mL (0 mLs Intravenous Stopped 05/08/21 1608)  potassium chloride SA (KLOR-CON) CR tablet 40 mEq (40 mEq Oral Given 05/08/21 1449)  iohexol (OMNIPAQUE) 350 MG/ML injection 60 mL (60 mLs Intravenous Contrast Given 05/08/21 1419)  lidocaine (XYLOCAINE) 2 % viscous mouth solution 15 mL (15 mLs Mouth/Throat Given 05/08/21 1450)  sodium chloride 0.9 % bolus 1,000 mL (0 mLs Intravenous Stopped 05/08/21 1702)  morphine 4 MG/ML injection 4 mg (4 mg Intravenous Given 05/08/21 1612)    ED Course  I have reviewed the triage vital signs and the nursing notes.  Pertinent labs & imaging results that were available during my care of the patient were reviewed by me and considered in my medical decision making (see chart for details).    MDM Rules/Calculators/A&P 45 year old female who presents emergency department with nausea vomiting and abdominal pain.  Considered but low risk for small bowel obstruction.  She is on movements and is passing flatus.  She has no chest pain and low suspicion for ACS as a cause of her abdominal pain. Her initial CMP with potassium of 3.0 (repleted), low bicarb, glucose of 245 and anion gap of 19.  Initially suspect that this is due to ongoing  nausea vomiting and diarrhea and dehydration.  She does not have a formal diagnosis of  diabetes.  Given that she was initially writhing in the bed obtain CT abdomen pelvis with contrast which showed pelvic congestion syndrome but no other abnormalities.  No evidence of abdominal aortic dissection. Pain being controlled with morphine and Zofran. Given 2 L of IV fluid and recheck BMP which showed a significant improvement in labs.  Anion gap is closed at 10, CO2 increasing to 23. CBC without leukocytosis Pregnant UA was amber and cloudy with moderate leukocytes and ketones.  Suspect that concentration due to dehydration. Her presentation is not consistent with acute hepatitis, acute pancreatitis (lipase 32), acute appendicitis (has previous appendectomy), acute diverticulitis.  There are no emergent surgical needs. After reevaluation suspect that she has gastroenteritis.  She is feeling much improved after fluids and pain medication.  I am discharging her with a prescription for Zofran.  I have also instructed her to present  to her primary care to have evaluation of hemoglobin A1c as her blood sugar was initially 245 and she is not currently taking any antidiabetic's.  She is instructed to return to the emergency department with a resurgence of abdominal pain, intractable nausea or vomiting, fevers. Her vital signs are stable and she is safe for discharge at this time. Final Clinical Impression(s) / ED Diagnoses Final diagnoses:  Gastroenteritis  Nausea vomiting and diarrhea    Rx / DC Orders ED Discharge Orders          Ordered    ondansetron (ZOFRAN ODT) 4 MG disintegrating tablet  Every 8 hours PRN        05/08/21 1745             Mickie Hillier, PA-C 05/08/21 1835    Valarie Merino, MD 05/10/21 [phone]

## 2021-05-08 NOTE — Discharge Instructions (Addendum)
You were seen in the emergency department today for nausea vomiting and abdominal pain.  While you were here we did an extensive work-up which was reassuring.  It likely is that you had a gastroenteritis that is hopefully passing as these normally last about 24 to 48 hours.  We were able to get your pain under control.  I am discharging you with a medication called Zofran which is used for nausea.  Please continue to push oral fluids as you were very dehydrated when you got here.  Drink lots of water and Gatorade.  Please go to your primary care provider in a week for reevaluation and also to check your blood sugars as you may need medication for this.  These return to the emergency department if you have increasing abdominal pain and fever.

## 2021-05-09 LAB — URINE CULTURE

## 2021-07-21 ENCOUNTER — Ambulatory Visit (HOSPITAL_COMMUNITY)
Admission: EM | Admit: 2021-07-21 | Discharge: 2021-07-21 | Disposition: A | Discharge status: Home / Self Care | Payer: Medicaid Other | Attending: Family Medicine | Admitting: Family Medicine

## 2021-07-21 ENCOUNTER — Other Ambulatory Visit: Payer: Self-pay

## 2021-07-21 DIAGNOSIS — Z5321 Procedure and treatment not carried out due to patient leaving prior to being seen by health care provider: Secondary | ICD-10-CM

## 2021-07-21 NOTE — ED Notes (Signed)
Called in lobby [facility] outside [place], patient access, called patient

## 2021-07-21 NOTE — ED Notes (Signed)
Called patient on the phone, no answer.

## 2021-10-25 ENCOUNTER — Emergency Department (HOSPITAL_COMMUNITY)
Admission: EM | Admit: 2021-10-25 | Discharge: 2021-10-26 | Disposition: A | Discharge status: Home / Self Care | Payer: Medicaid Other | Attending: Emergency Medicine | Admitting: Emergency Medicine

## 2021-10-25 ENCOUNTER — Other Ambulatory Visit: Payer: Self-pay

## 2021-10-25 DIAGNOSIS — Y907 Blood alcohol level of 200-239 mg/100 ml: Secondary | ICD-10-CM | POA: Diagnosis not present

## 2021-10-25 DIAGNOSIS — E876 Hypokalemia: Secondary | ICD-10-CM | POA: Diagnosis not present

## 2021-10-25 DIAGNOSIS — R531 Weakness: Secondary | ICD-10-CM | POA: Diagnosis present

## 2021-10-25 DIAGNOSIS — F10120 Alcohol abuse with intoxication, uncomplicated: Secondary | ICD-10-CM | POA: Insufficient documentation

## 2021-10-25 DIAGNOSIS — R4182 Altered mental status, unspecified: Secondary | ICD-10-CM | POA: Diagnosis not present

## 2021-10-25 DIAGNOSIS — F10920 Alcohol use, unspecified with intoxication, uncomplicated: Secondary | ICD-10-CM

## 2021-10-25 DIAGNOSIS — E162 Hypoglycemia, unspecified: Secondary | ICD-10-CM | POA: Diagnosis not present

## 2021-10-25 LAB — COMPREHENSIVE METABOLIC PANEL
ALT: 17 U/L (ref 0–44)
AST: 48 U/L — ABNORMAL HIGH (ref 15–41)
Albumin: 4 g/dL (ref 3.5–5.0)
Alkaline Phosphatase: 86 U/L (ref 38–126)
Anion gap: 14 (ref 5–15)
BUN: 17 mg/dL (ref 6–20)
CO2: 19 mmol/L — ABNORMAL LOW (ref 22–32)
Calcium: 8.8 mg/dL — ABNORMAL LOW (ref 8.9–10.3)
Chloride: 105 mmol/L (ref 98–111)
Creatinine, Ser: 0.73 mg/dL (ref 0.44–1.00)
GFR, Estimated: >60 mL/min (ref >60)
Glucose, Bld: 131 mg/dL — ABNORMAL HIGH (ref 70–99)
Potassium: 3.2 mmol/L — ABNORMAL LOW (ref 3.5–5.1)
Sodium: 138 mmol/L (ref 135–145)
Total Bilirubin: 1.5 mg/dL — ABNORMAL HIGH (ref 0.3–1.2)
Total Protein: 7.6 g/dL (ref 6.5–8.1)

## 2021-10-25 LAB — CBC WITH DIFFERENTIAL/PLATELET
Abs Immature Granulocytes: 0.03 10*3/uL (ref 0.00–0.07)
Basophils Absolute: 0 10*3/uL (ref 0.0–0.1)
Basophils Relative: 1 %
Eosinophils Absolute: 0 10*3/uL (ref 0.0–0.5)
Eosinophils Relative: 1 %
HCT: 38.4 % (ref 36.0–46.0)
Hemoglobin: 12.5 g/dL (ref 12.0–15.0)
Immature Granulocytes: 0 %
Lymphocytes Relative: 10 %
Lymphs Abs: 0.8 10*3/uL (ref 0.7–4.0)
MCH: 30.8 pg (ref 26.0–34.0)
MCHC: 32.6 g/dL (ref 30.0–36.0)
MCV: 94.6 fL (ref 80.0–100.0)
Monocytes Absolute: 0.4 10*3/uL (ref 0.1–1.0)
Monocytes Relative: 5 %
Neutro Abs: 6.4 10*3/uL (ref 1.7–7.7)
Neutrophils Relative %: 83 %
Platelets: 227 10*3/uL (ref 150–400)
RBC: 4.06 MIL/uL (ref 3.87–5.11)
RDW: 13.6 % (ref 11.5–15.5)
WBC: 7.6 10*3/uL (ref 4.0–10.5)
nRBC: 0 % (ref 0.0–0.2)

## 2021-10-25 LAB — ACETAMINOPHEN LEVEL: Acetaminophen (Tylenol), Serum: <10 ug/mL — ABNORMAL LOW (ref 10–30)

## 2021-10-25 LAB — HCG, SERUM, QUALITATIVE: Preg, Serum: NEGATIVE

## 2021-10-25 LAB — ETHANOL: Alcohol, Ethyl (B): 203 mg/dL — ABNORMAL HIGH (ref <10)

## 2021-10-25 MED ORDER — SODIUM CHLORIDE 0.9 % IV SOLN
1000.0000 mL | INTRAVENOUS | Status: DC
Start: 2021-10-25 — End: 2021-10-26
  Administered 2021-10-26: 1000 mL via INTRAVENOUS

## 2021-10-25 MED ORDER — ONDANSETRON HCL 4 MG/2ML IJ SOLN
4.0000 mg | Freq: Once | INTRAMUSCULAR | Status: AC
  Administered 2021-10-25: 4 mg via INTRAVENOUS
  Filled 2021-10-25: qty 2

## 2021-10-25 MED ORDER — SODIUM CHLORIDE 0.9 % IV BOLUS (SEPSIS)
1000.0000 mL | Freq: Once | INTRAVENOUS | Status: AC
  Administered 2021-10-25: 1000 mL via INTRAVENOUS

## 2021-10-25 NOTE — ED Triage Notes (Signed)
Patient coming to ED via [place].  Called for possible allergic reaction to Tylenol.  Was uncooperative on scene.  Reports only having one drink of ETOH tonight.  Glucose 70 and given D10 125 mL IV and LR PTA ?

## 2021-10-25 NOTE — ED Notes (Signed)
Patient ambulatory to bathroom.  Steady gait noted.  Patient reports just "feeling off." ?

## 2021-10-25 NOTE — ED Provider Notes (Signed)
?[facility] DEPT ?Provider Note ? ? ?CSN: [id number] ?Arrival date & time: 10/25/21  2123 ? ?  ? ?History ? ?Chief Complaint  ?Patient presents with  ? Altered Mental Status  ? Hypoglycemia  ? ? ?ANTHEA UDOVICH is a 46 y.o. female. ? ? ?Altered Mental Status ?Associated symptoms: no fever   ?Hypoglycemia ?Associated symptoms: altered mental status   ? ?Patient has history of PTSD, sarcoma who presents to the ED with complaints of generalized weakness.  Patient initially thought she might of had a reaction to Tylenol.  She admitted to drinking 1 alcoholic beverage this evening.  Patient states she started to feel weak and shaky.  She had generalized malaise.  She called EMS.  They checked her sugar and it was 70.  She was given 125 cc of D10.  Patient denies any chest pain.  No fevers or chills. ? ?Home Medications ?Prior to Admission medications   ?Medication Sig Start Date End Date Taking? Authorizing Provider  ?busPIRone (BUSPAR) 15 MG tablet Take 15 mg by mouth 3 (three) times daily. 04/06/21   [provider]  ?ibuprofen (ADVIL) 200 MG tablet Take 400 mg by mouth every 6 (six) hours as needed for headache or moderate pain.    [provider]  ?Multiple Vitamin (MULTIVITAMIN WITH MINERALS) TABS tablet Take 1 tablet by mouth daily.    [provider]  ?ondansetron (ZOFRAN ODT) 4 MG disintegrating tablet Take 1 tablet (4 mg total) by mouth every 8 (eight) hours as needed for nausea or vomiting. 05/08/21   Mickie Hillier, PA-C  ?sertraline (ZOLOFT) 100 MG tablet Take 100 mg by mouth daily. 06/22/20   [provider]  ?traZODone (DESYREL) 50 MG tablet Take 50 mg by mouth at bedtime as needed for sleep. 11/25/18   [provider]  ?Vitamin D, Ergocalciferol, (DRISDOL) 1.25 MG (50000 UNIT) CAPS capsule Take 50,000 Units by mouth every Monday. 04/05/21   [provider]  ?   ? ?Allergies    ?Patient has no known allergies.   ? ?Review  of Systems   ?Review of Systems  ?Constitutional:  Negative for fever.  ? ?Physical Exam ?Updated Vital Signs ?BP (!) 149/88 (BP Location: Left Arm)   Pulse (!) 57   Resp 16   Ht 1.6 m ('5\' 3"'$ )   Wt 54.4 kg   SpO2 99%   BMI 21.26 kg/m?  ?Physical Exam ?Vitals and nursing note reviewed.  ?Constitutional:   ?   General: She is not in acute distress. ?   Appearance: She is well-developed.  ?HENT:  ?   Head: Normocephalic and atraumatic.  ?   Right Ear: External ear normal.  ?   Left Ear: External ear normal.  ?Eyes:  ?   General: No scleral icterus.    ?   Right eye: No discharge.     ?   Left eye: No discharge.  ?   Conjunctiva/sclera: Conjunctivae normal.  ?Neck:  ?   Trachea: No tracheal deviation.  ?Cardiovascular:  ?   Rate and Rhythm: Normal rate and regular rhythm.  ?Pulmonary:  ?   Effort: Pulmonary effort is normal. No respiratory distress.  ?   Breath sounds: Normal breath sounds. No stridor. No wheezing or rales.  ?Abdominal:  ?   General: Bowel sounds are normal. There is no distension.  ?   Palpations: Abdomen is soft.  ?   Tenderness: There is no abdominal tenderness. There is no guarding  or rebound.  ?Musculoskeletal:     ?   General: No tenderness or deformity.  ?   Cervical back: Neck supple.  ?Skin: ?   General: Skin is warm and dry.  ?   Findings: No rash.  ?Neurological:  ?   General: No focal deficit present.  ?   Mental Status: She is alert.  ?   Cranial Nerves: No cranial nerve deficit (no facial droop, extraocular movements intact, no slurred speech).  ?   Sensory: No sensory deficit.  ?   Motor: No abnormal muscle tone or seizure activity.  ?   Coordination: Coordination normal.  ?Psychiatric:     ?   Mood and Affect: Mood normal.  ? ? ?ED Results / Procedures / Treatments   ?Labs ?(all labs ordered are listed, but only abnormal results are displayed) ?Labs Reviewed  ?ETHANOL  ?COMPREHENSIVE METABOLIC PANEL  ?CBC WITH DIFFERENTIAL/PLATELET  ?I-STAT BETA HCG BLOOD, ED ([facility], [facility], [facility] ONLY)   ? ? ?EKG ?None ? ?Radiology ?No results found. ? ?Procedures ?Procedures  ? ? ?Medications Ordered in ED ?Medications  ?sodium chloride 0.9 % bolus 1,000 mL (has no administration in time range)  ?  Followed by  ?0.9 %  sodium chloride infusion (has no administration in time range)  ?ondansetron (ZOFRAN) injection 4 mg (has no administration in time range)  ? ? ?ED Course/ Medical Decision Making/ A&P ?  ?                        ?Medical Decision Making ?Amount and/or Complexity of Data Reviewed ?External Data Reviewed: notes. ?   Details: prior ed visits for alcohol intoxication ?Labs: ordered. ?   Details: documented in mfm ? ?Risk ?OTC drugs. ?Prescription drug management. ? ? ?Initial labs showed a normal wbc count.   ETOH level was elevated at 203.  Suspect that is the main culprit in her symptoms.  Pt treated with IV fluids.  Metabolic panel was pending at the time of shift change.  Care turned over to Dr Dayna Barker.   Anticipate dc once pt is clinically sober safe for discharge if there are no significant electrolyte abnormalities ? ? ? ? ? ? ? ?Final Clinical Impression(s) / ED Diagnoses ?Final diagnoses:  ?Alcoholic intoxication without complication ([place])  ?Hypokalemia  ? ? ?Rx / DC Orders ?ED Discharge Orders   ? ? None  ? ?  ? ? ?  ?Dorie Rank, MD ?10/27/21 1504 ? ?

## 2021-10-25 NOTE — ED Notes (Signed)
Call received from pt daughter Flo Berroa [phone] requesting rtn call for pt status/updates when possible. ENMiles ?

## 2021-10-26 MED ORDER — POTASSIUM CHLORIDE CRYS ER 20 MEQ PO TBCR: 20.0000 meq | EXTENDED_RELEASE_TABLET | Freq: Every day | ORAL | 0 refills | Status: DC

## 2021-10-26 MED ORDER — IBUPROFEN 200 MG PO TABS
400.0000 mg | ORAL_TABLET | Freq: Once | ORAL | Status: AC
  Administered 2021-10-26: 400 mg via ORAL
  Filled 2021-10-26: qty 2

## 2021-10-26 MED ORDER — POTASSIUM CHLORIDE CRYS ER 20 MEQ PO TBCR
40.0000 meq | EXTENDED_RELEASE_TABLET | Freq: Once | ORAL | Status: AC
  Administered 2021-10-26: 40 meq via ORAL
  Filled 2021-10-26: qty 2

## 2021-10-26 MED ORDER — POTASSIUM CHLORIDE 10 MEQ/100ML IV SOLN
10.0000 meq | Freq: Once | INTRAVENOUS | Status: AC
  Administered 2021-10-26: 10 meq via INTRAVENOUS
  Filled 2021-10-26: qty 100

## 2021-10-26 NOTE — ED Notes (Signed)
Patient c/o HA.  Medicated per order ?

## 2021-10-26 NOTE — ED Provider Notes (Signed)
4:01 AM ?Assumed care from Dr. Tomi Bamberger, please see their note for full history, physical and decision making until this point. In brief this is a 46 y.o. year old female who presented to the ED tonight with Altered Mental Status and Hypoglycemia ?    ?Reevaluation patient feels much better.  She is asymptomatic at this time.  I discussed with her her symptoms she states that she feels like she was chest.  States she took Tylenol and shortly after took a second dose but then she got really worried that she might of taken too much and then she had a panic attack and that kind of escalated and caused her symptoms bring her here tonight.  Once again she feels normal now.  Her vital signs are stable.  Her mental status is stable.  She is stable for discharge ? ?Discharge instructions, including strict return precautions for new or worsening symptoms, given. Patient and/or family verbalized understanding and agreement with the plan as described.  ? ?Labs, studies and imaging reviewed by myself and considered in medical decision making if ordered. Imaging interpreted by radiology. ? ?Labs Reviewed  ?ETHANOL - Abnormal; Notable for the following components:  ?    Result Value  ? Alcohol, Ethyl (B) 203 (*)   ? All other components within normal limits  ?COMPREHENSIVE METABOLIC PANEL - Abnormal; Notable for the following components:  ? Potassium 3.2 (*)   ? CO2 19 (*)   ? Glucose, Bld 131 (*)   ? Calcium 8.8 (*)   ? AST 48 (*)   ? Total Bilirubin 1.5 (*)   ? All other components within normal limits  ?ACETAMINOPHEN LEVEL - Abnormal; Notable for the following components:  ? Acetaminophen (Tylenol), Serum <10 (*)   ? All other components within normal limits  ?CBC WITH DIFFERENTIAL/PLATELET  ?HCG, SERUM, QUALITATIVE  ?I-STAT BETA HCG BLOOD, ED ([facility], [facility], [facility] ONLY)  ? ? ?No orders to display  ? ? ?No follow-ups on file. ? ?  ?Merrily Pew, MD ?10/26/21 0402 ? ?

## 2021-10-26 NOTE — ED Notes (Signed)
Patient reports severe burning with IV potassium.  Rate decreased at this time ?

## 2021-10-26 NOTE — ED Notes (Signed)
Patient updated on plan of care

## 2021-11-17 NOTE — Progress Notes (Deleted)
Office Visit Note  Patient: [name]             Date of Birth: [birth year]           MRN: [id number]             PCP: Mancel Bale, PA-C Referring: Mancel Bale, PA-C Visit Date: 11/18/2021 Occupation: '@GUAROCC'$ @  Subjective:  No chief complaint on file.   History of Present Illness: [name] is a 46 y.o. female here for evaluation of chronic bilateral hand pain and swelling. Evaluation in orthopedic clinic concerning for proximal finger joint synovitis and xrays reported erosive changes and periarticular osteopenia. She has a history significant for alcohol abuse and anxiety with panic disorder.***   Activities of Daily Living:  Patient reports morning stiffness for *** {minute/hour:19697}.   Patient {ACTIONS;DENIES/REPORTS:21021675::"Denies"} nocturnal pain.  Difficulty dressing/grooming: {ACTIONS;DENIES/REPORTS:21021675::"Denies"} Difficulty climbing stairs: {ACTIONS;DENIES/REPORTS:21021675::"Denies"} Difficulty getting out of chair: {ACTIONS;DENIES/REPORTS:21021675::"Denies"} Difficulty using hands for taps, buttons, cutlery, and/or writing: {ACTIONS;DENIES/REPORTS:21021675::"Denies"}  No Rheumatology ROS completed.   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   Objective: Vital Signs:  There were no vitals taken for this visit.   Physical Exam   Musculoskeletal Exam: ***  CDAI Exam: CDAI Score: -- Patient Global: --; Provider Global: -- Swollen: --; Tender: -- Joint Exam 11/18/2021   No joint exam has been documented for this visit   There is currently no information documented on the homunculus. Go to the Rheumatology activity and complete the homunculus joint exam.  Investigation: No additional findings.  Imaging: No results found.  Recent Labs: Lab Results  Component Value Date   WBC 7.6 10/25/2021   HGB 12.5 10/25/2021   PLT 227 10/25/2021   NA 138 10/25/2021   K 3.2 (L) 10/25/2021   CL 105 10/25/2021   CO2 19 (L) 10/25/2021   GLUCOSE 131 (H) 10/25/2021   BUN 17 10/25/2021   CREATININE 0.73 10/25/2021   BILITOT 1.5 (H) 10/25/2021   ALKPHOS 86 10/25/2021   AST 48 (H) 10/25/2021   ALT 17 10/25/2021   PROT 7.6 10/25/2021   ALBUMIN 4.0 10/25/2021   CALCIUM 8.8 (L) 10/25/2021   GFRAA >60 12/20/2018    Speciality Comments: No specialty comments available.  Procedures:  No procedures performed Allergies: Patient has no known allergies.   Assessment / Plan:     Visit Diagnoses: No diagnosis found.  Orders: No orders of the defined types were placed in this encounter.  No orders of the defined types were placed in this encounter.   Face-to-face time spent with patient was *** minutes. Greater than 50% of time was spent in counseling and coordination of care.  Follow-Up  Instructions: No follow-ups on file.   Collier Salina, MD  Note - This record has been created using [employer].  Chart creation errors have been sought, but may not always  have been located. Such creation errors do not reflect on  the standard of medical care.

## 2021-11-18 ENCOUNTER — Ambulatory Visit: Payer: Medicaid Other | Admitting: Internal Medicine

## 2021-11-23 ENCOUNTER — Ambulatory Visit (INDEPENDENT_AMBULATORY_CARE_PROVIDER_SITE_OTHER): Payer: Medicaid Other

## 2021-11-23 ENCOUNTER — Encounter: Payer: Self-pay | Admitting: Emergency Medicine

## 2021-11-23 ENCOUNTER — Ambulatory Visit
Admission: EM | Admit: 2021-11-23 | Discharge: 2021-11-23 | Disposition: A | Discharge status: Home / Self Care | Payer: Medicaid Other | Attending: Urgent Care | Admitting: Urgent Care

## 2021-11-23 DIAGNOSIS — M25522 Pain in left elbow: Secondary | ICD-10-CM

## 2021-11-23 DIAGNOSIS — M25521 Pain in right elbow: Secondary | ICD-10-CM

## 2021-11-23 DIAGNOSIS — S66307A Unspecified injury of extensor muscle, fascia and tendon of left little finger at wrist and hand level, initial encounter: Secondary | ICD-10-CM

## 2021-11-23 IMAGING — DX DG ELBOW COMPLETE 3+V*L*
4 series · 4 of 4 positions shown · non-contrast
Comparison: None

CLINICAL DATA: Left elbow injury.  Pain and swelling.

EXAM:
LEFT ELBOW - COMPLETE 3+ VIEW

[radius head ulnoradial]
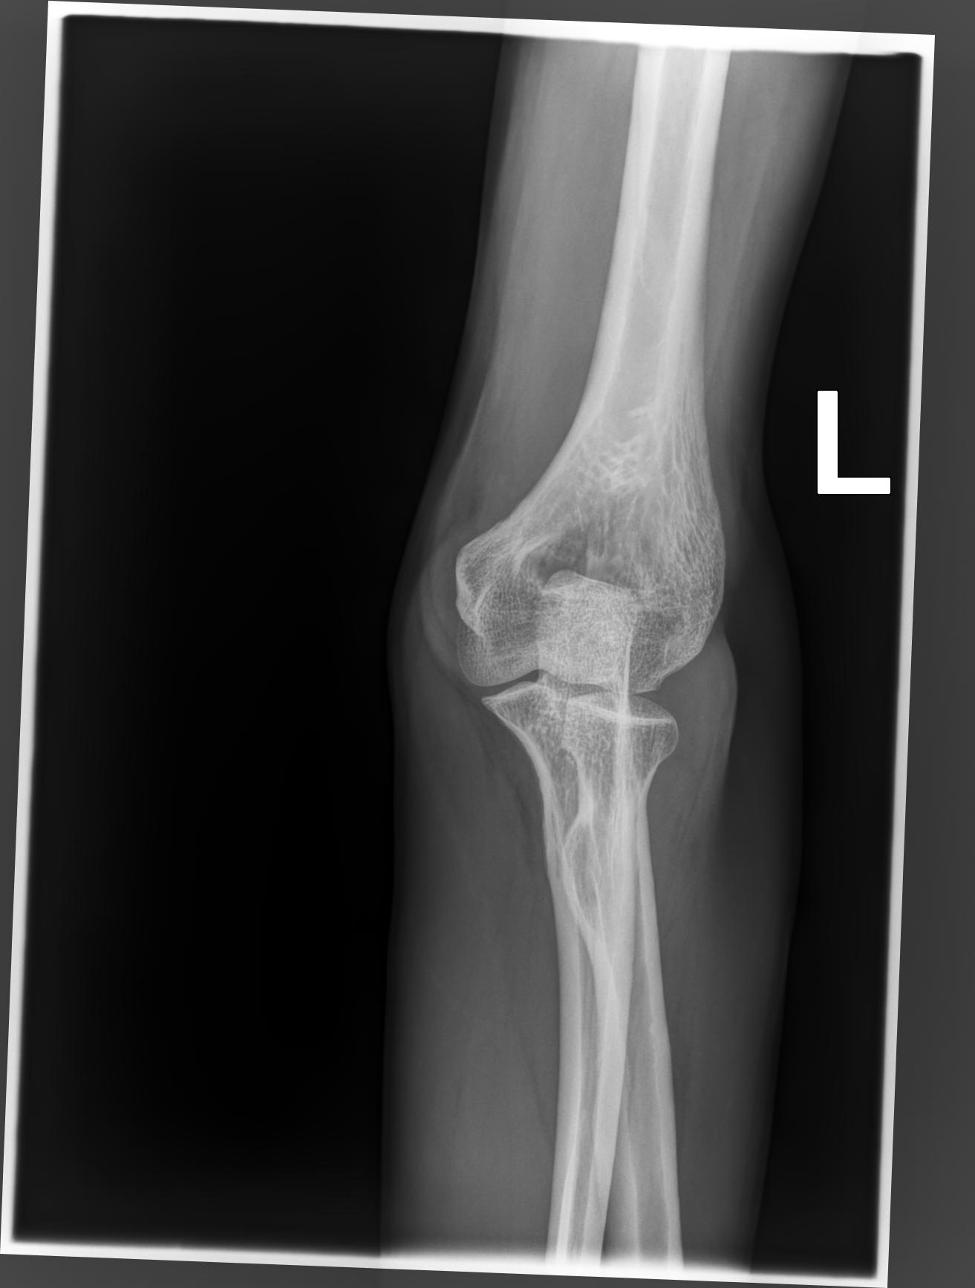

[processus coronoideus ulnae (coronoid pr]
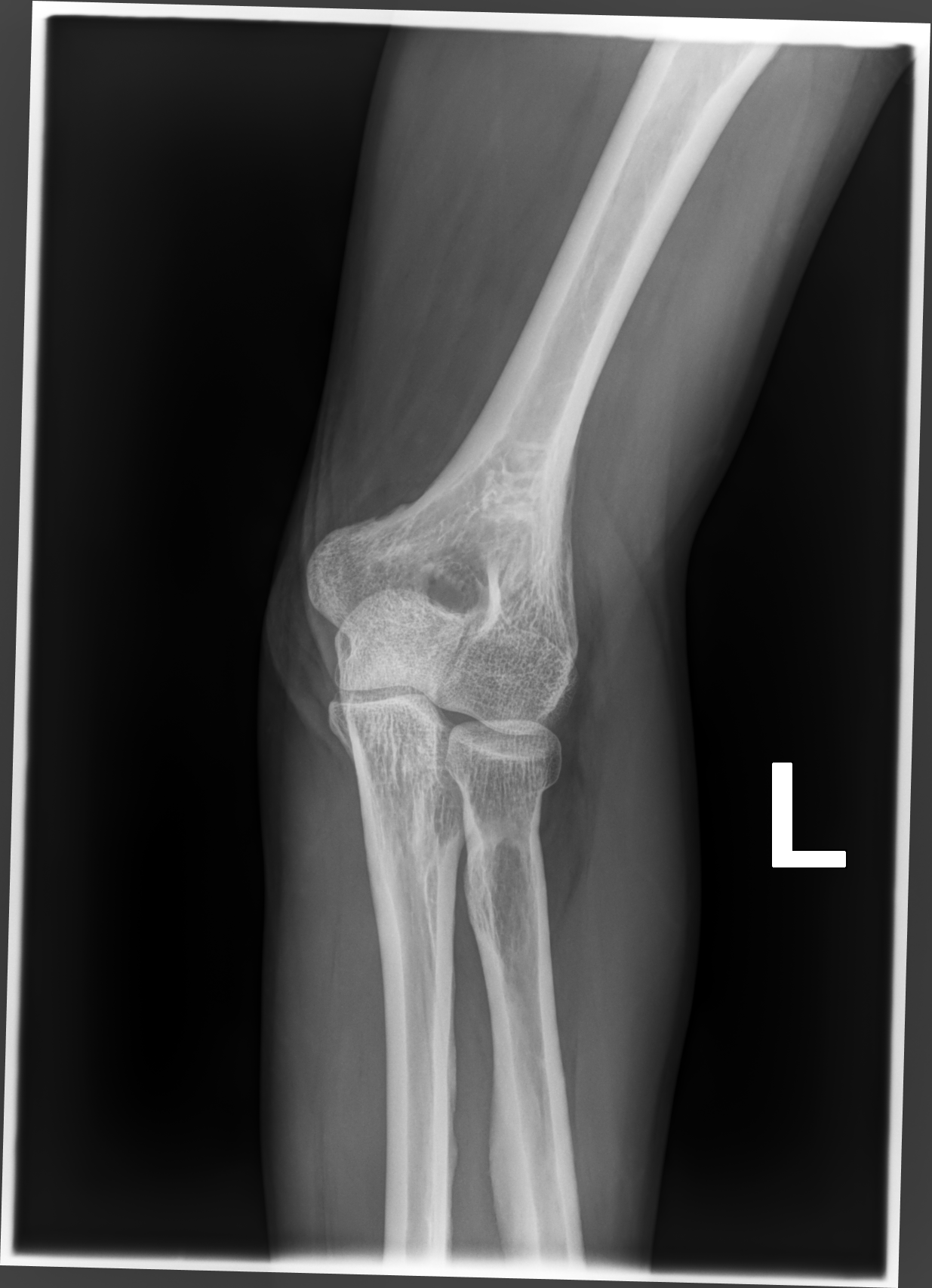

[elbow ap]
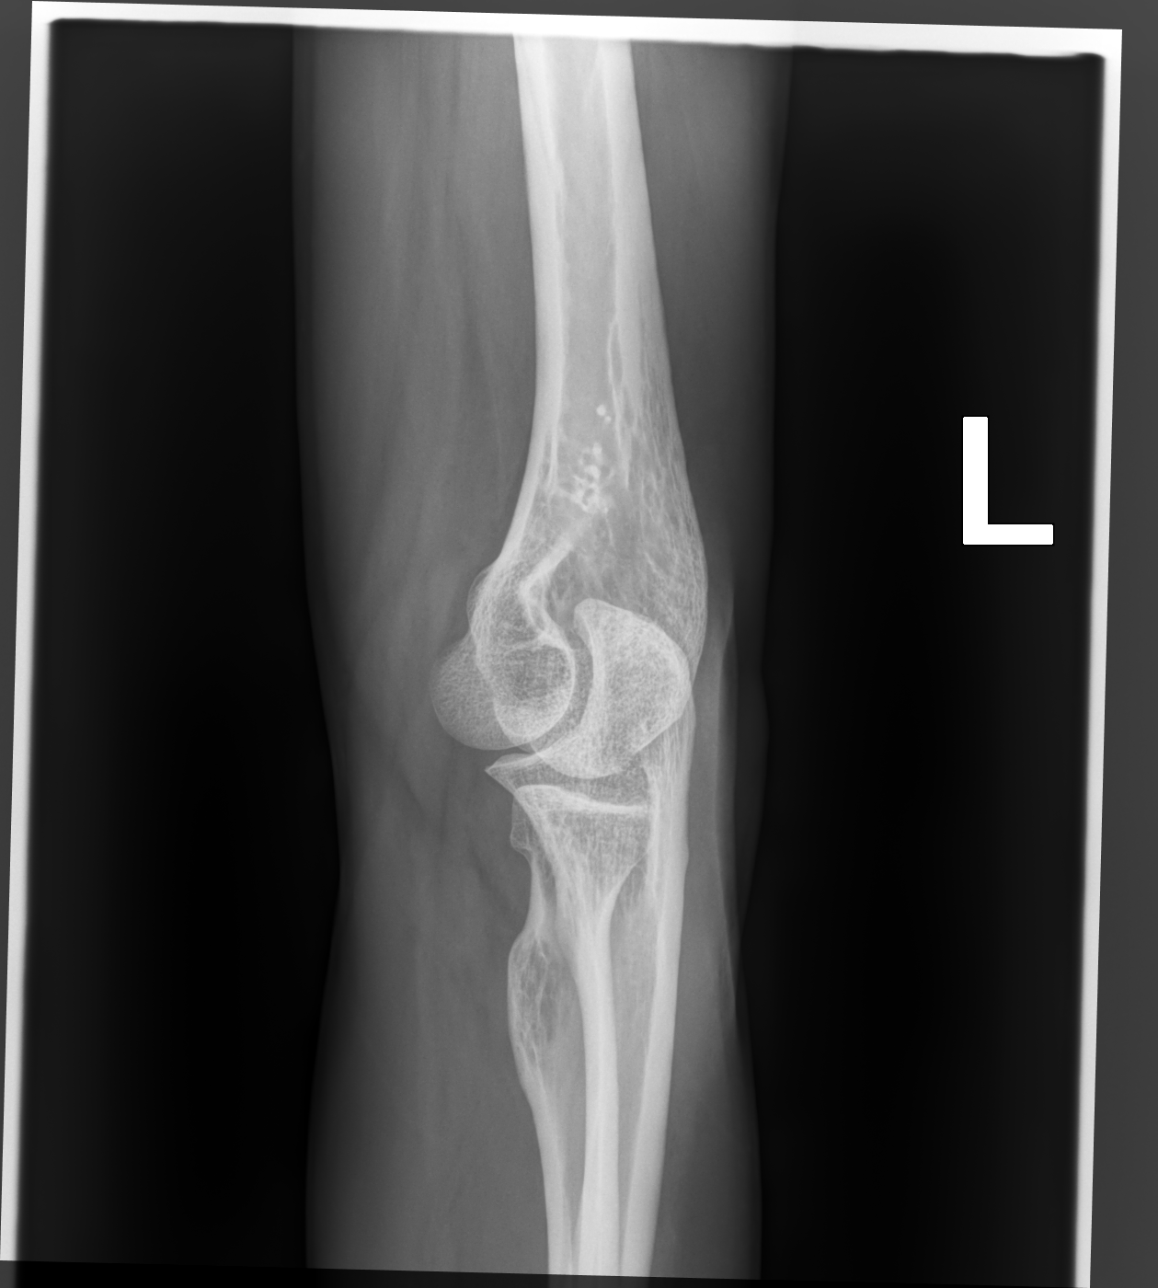

[elbow lat]
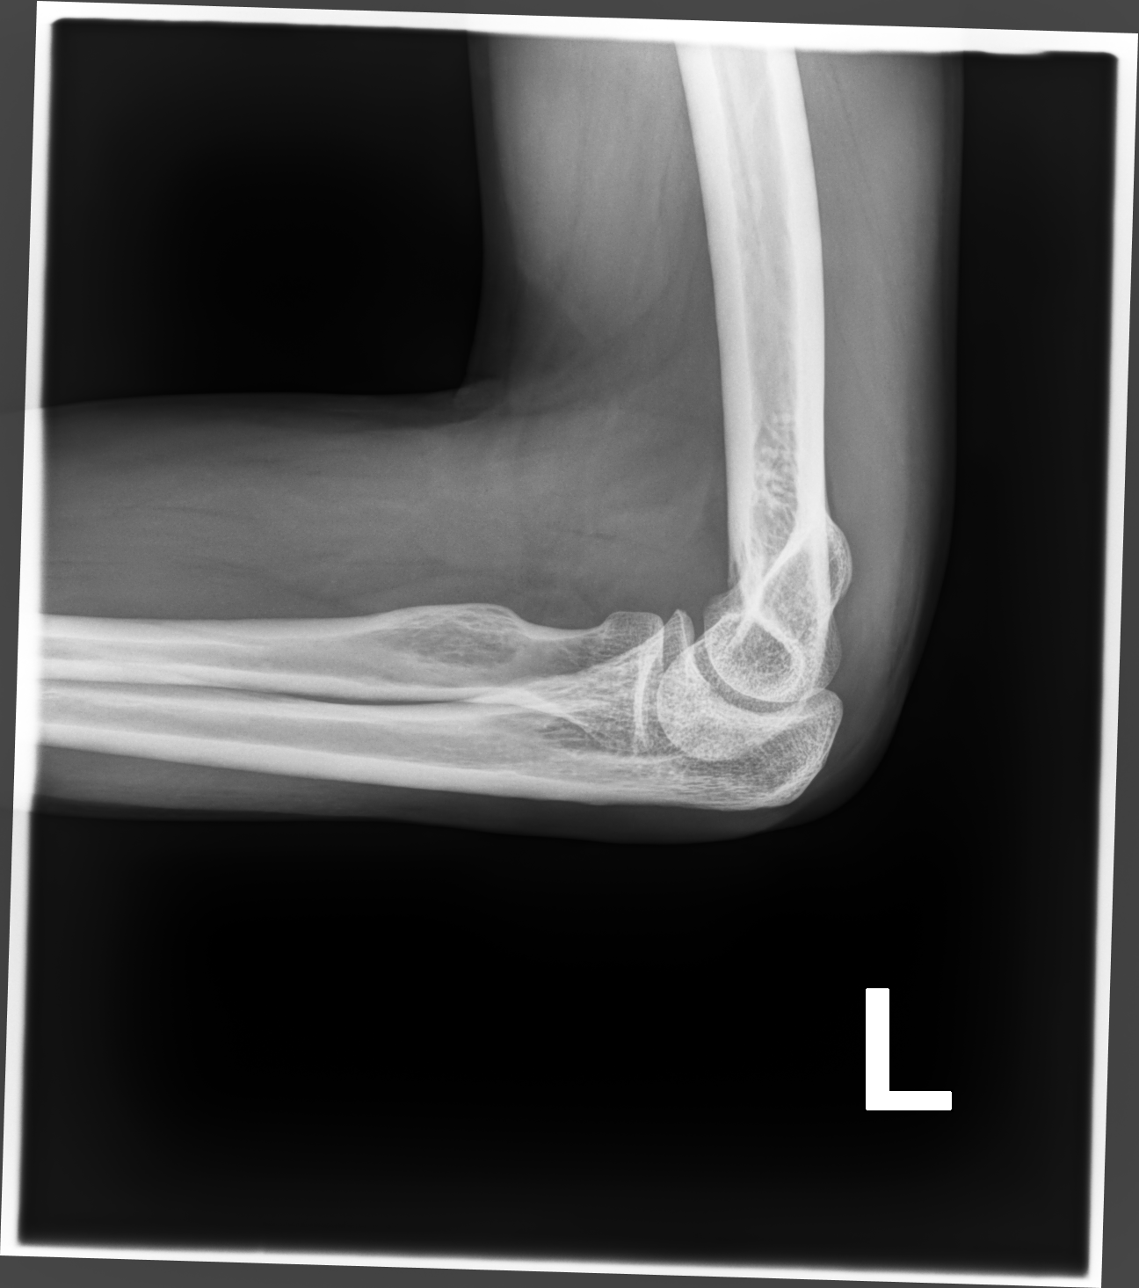

[4 of 4 positions shown; findings below may reference images not displayed]

FINDINGS: The joint spaces are maintained. No fracture, bone lesion
osteochondral abnormality. Suspect a small elbow joint effusion.
IMPRESSION: No acute bony findings.

Small joint effusion.

## 2021-11-23 MED ORDER — KETOROLAC TROMETHAMINE 30 MG/ML IJ SOLN
30.0000 mg | Freq: Once | INTRAMUSCULAR | Status: AC
  Administered 2021-11-23: 30 mg via INTRAMUSCULAR
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
## 2021-11-23 NOTE — Discharge Instructions (Signed)
Your xray does not show a fracture. ?Your pain is likely from a contusion, or a deep bruise to the soft tissue. ?Please ice the area. ?You were given a pain medication here called toradol. ?Starting tomorrow, you can take '600mg'$  (3 tabs) of ibuprofen every 6 hours as needed for pain. ?Wear the sling for comfort, but make sure you are mobilzing your shoulder. ?Please call ortho to schedule an evaluation for your pinky finger. ?

## 2021-11-25 ENCOUNTER — Encounter: Payer: Self-pay | Admitting: Urgent Care
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
## 2022-02-21 ENCOUNTER — Ambulatory Visit: Payer: Medicaid Other | Attending: Internal Medicine | Admitting: Internal Medicine

## 2022-02-21 NOTE — Progress Notes (Deleted)
Office Visit Note  Patient: [name]             Date of Birth: 02-20-1976           MRN: [id number]             PCP: Mancel Bale, PA-C Referring: Mancel Bale, PA-C Visit Date: 02/21/2022 Occupation: '@GUAROCC'$ @  Subjective:  No chief complaint on file.   History of Present Illness: [name] is a 46 y.o. female here for evaluation of bilateral hand pain.  [facility] Dr. Layne Benton with evaluation showing slight reductions in finger range of motion some active synovitis as well as possible mild chronic finger deformities.  She previously had laboratory test showing elevated sedimentation rate with negative ANA.  She had additional rheumatoid arthritis work-up and x-rays in orthopedics office results which I was unable to review.  Recent left elbow x-ray from May of this year demonstrating small joint effusion.***   Activities of Daily Living:  Patient reports morning stiffness for *** {minute/hour:19697}.   Patient {ACTIONS;DENIES/REPORTS:21021675::"Denies"} nocturnal pain.  Difficulty dressing/grooming: {ACTIONS;DENIES/REPORTS:21021675::"Denies"} Difficulty climbing stairs: {ACTIONS;DENIES/REPORTS:21021675::"Denies"} Difficulty getting out of chair: {ACTIONS;DENIES/REPORTS:21021675::"Denies"} Difficulty using hands for taps, buttons, cutlery, and/or writing: {ACTIONS;DENIES/REPORTS:21021675::"Denies"}  No Rheumatology ROS completed.   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   Objective: Vital Signs: There were no vitals taken for this visit.   Physical Exam   Musculoskeletal Exam: ***  CDAI Exam: CDAI Score: -- Patient Global: --; Provider Global: -- Swollen: --; Tender: -- Joint Exam 02/21/2022   No joint exam has been documented for this visit   There is currently no information documented on the homunculus. Go to the Rheumatology activity and complete the homunculus joint exam.  Investigation: No additional findings.  Imaging: No results found.  Recent Labs: Lab Results  Component Value Date   WBC 7.6 10/25/2021   HGB 12.5 10/25/2021   PLT 227 10/25/2021   NA 138 10/25/2021   K 3.2 (L) 10/25/2021   CL 105 10/25/2021   CO2 19 (L) 10/25/2021   GLUCOSE 131 (H) 10/25/2021   BUN 17 10/25/2021   CREATININE 0.73 10/25/2021   BILITOT 1.5 (H) 10/25/2021   ALKPHOS 86 10/25/2021   AST 48 (H) 10/25/2021   ALT 17 10/25/2021   PROT 7.6 10/25/2021   ALBUMIN 4.0 10/25/2021   CALCIUM 8.8 (L) 10/25/2021   GFRAA >60 12/20/2018    Speciality Comments: No specialty comments available.  Procedures:  No procedures performed Allergies: Patient has no known allergies.   Assessment / Plan:     Visit Diagnoses: No diagnosis found.  Orders: No orders of the  defined types were placed in this encounter.  No orders of the defined types were placed in this encounter.   Face-to-face time spent with patient was *** minutes. Greater than 50% of time was spent in counseling and coordination of care.  Follow-Up Instructions: No follow-ups on file.   Collier Salina, MD  Note - This record has been created using [employer].  Chart creation errors have been sought, but may not always  have been located. Such  creation errors do not reflect on  the standard of medical care.

## 2022-07-29 ENCOUNTER — Emergency Department (HOSPITAL_COMMUNITY): Payer: Medicaid Other

## 2022-07-29 ENCOUNTER — Emergency Department (HOSPITAL_COMMUNITY)
Admission: EM | Admit: 2022-07-29 | Discharge: 2022-07-29 | Disposition: A | Discharge status: Home / Self Care | Payer: Medicaid Other | Attending: Emergency Medicine | Admitting: Emergency Medicine

## 2022-07-29 ENCOUNTER — Other Ambulatory Visit: Payer: Self-pay

## 2022-07-29 ENCOUNTER — Encounter (HOSPITAL_COMMUNITY): Payer: Self-pay

## 2022-07-29 DIAGNOSIS — M7989 Other specified soft tissue disorders: Secondary | ICD-10-CM | POA: Diagnosis present

## 2022-07-29 DIAGNOSIS — S0083XA Contusion of other part of head, initial encounter: Secondary | ICD-10-CM | POA: Diagnosis not present

## 2022-07-29 DIAGNOSIS — Z20822 Contact with and (suspected) exposure to covid-19: Secondary | ICD-10-CM | POA: Insufficient documentation

## 2022-07-29 DIAGNOSIS — M7918 Myalgia, other site: Secondary | ICD-10-CM

## 2022-07-29 DIAGNOSIS — M79631 Pain in right forearm: Secondary | ICD-10-CM | POA: Diagnosis not present

## 2022-07-29 DIAGNOSIS — L03011 Cellulitis of right finger: Secondary | ICD-10-CM | POA: Diagnosis not present

## 2022-07-29 DIAGNOSIS — M25521 Pain in right elbow: Secondary | ICD-10-CM | POA: Diagnosis not present

## 2022-07-29 DIAGNOSIS — X58XXXA Exposure to other specified factors, initial encounter: Secondary | ICD-10-CM | POA: Diagnosis not present

## 2022-07-29 LAB — RESP PANEL BY RT-PCR (RSV, FLU A&B, COVID)  RVPGX2
Influenza A by PCR: NEGATIVE
Influenza B by PCR: NEGATIVE
Resp Syncytial Virus by PCR: NEGATIVE
SARS Coronavirus 2 by RT PCR: NEGATIVE

## 2022-07-29 MED ORDER — ACETAMINOPHEN 500 MG PO TABS
1000.0000 mg | ORAL_TABLET | Freq: Once | ORAL | Status: AC
Start: 2022-07-29 — End: 2022-07-29
  Administered 2022-07-29: 1000 mg via ORAL
  Filled 2022-07-29: qty 2

## 2022-07-29 MED ORDER — NAPROXEN 500 MG PO TABS: 500.0000 mg | ORAL_TABLET | Freq: Two times a day (BID) | ORAL | 0 refills | Status: DC

## 2022-07-29 MED ORDER — CEPHALEXIN 500 MG PO CAPS
500.0000 mg | ORAL_CAPSULE | Freq: Four times a day (QID) | ORAL | 0 refills | Status: DC
Start: 2022-07-29 — End: 2023-03-29

## 2022-07-29 MED ORDER — SULFAMETHOXAZOLE-TRIMETHOPRIM 800-160 MG PO TABS: 1.0000 | ORAL_TABLET | Freq: Two times a day (BID) | ORAL | 0 refills | Status: AC
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
## 2022-07-29 NOTE — Discharge Instructions (Signed)
Please take your medications as prescribed, take tylenol/ibuprofen for pain. I recommend close follow-up with PCP for reevaluation.  Please do not hesitate to return to emergency department if worrisome signs symptoms we discussed become apparent.

## 2022-07-31 ENCOUNTER — Other Ambulatory Visit: Payer: Self-pay

## 2022-07-31 ENCOUNTER — Emergency Department (HOSPITAL_COMMUNITY)
Admission: EM | Admit: 2022-07-31 | Discharge: 2022-07-31 | Disposition: A | Discharge status: Home / Self Care | Payer: Medicaid Other | Attending: Emergency Medicine | Admitting: Emergency Medicine

## 2022-07-31 ENCOUNTER — Emergency Department (HOSPITAL_COMMUNITY): Payer: Medicaid Other

## 2022-07-31 DIAGNOSIS — R1012 Left upper quadrant pain: Secondary | ICD-10-CM | POA: Insufficient documentation

## 2022-07-31 DIAGNOSIS — S299XXA Unspecified injury of thorax, initial encounter: Secondary | ICD-10-CM | POA: Insufficient documentation

## 2022-07-31 DIAGNOSIS — S298XXA Other specified injuries of thorax, initial encounter: Secondary | ICD-10-CM

## 2022-07-31 LAB — BASIC METABOLIC PANEL
Anion gap: 17 — ABNORMAL HIGH (ref 5–15)
BUN: 10 mg/dL (ref 6–20)
CO2: 27 mmol/L (ref 22–32)
Calcium: 9.3 mg/dL (ref 8.9–10.3)
Chloride: 96 mmol/L — ABNORMAL LOW (ref 98–111)
Creatinine, Ser: 0.9 mg/dL (ref 0.44–1.00)
GFR, Estimated: >60 mL/min (ref >60)
Glucose, Bld: 93 mg/dL (ref 70–99)
Potassium: 2.8 mmol/L — ABNORMAL LOW (ref 3.5–5.1)
Sodium: 140 mmol/L (ref 135–145)

## 2022-07-31 LAB — CBC
HCT: 35.5 % — ABNORMAL LOW (ref 36.0–46.0)
Hemoglobin: 12.1 g/dL (ref 12.0–15.0)
MCH: 31.6 pg (ref 26.0–34.0)
MCHC: 34.1 g/dL (ref 30.0–36.0)
MCV: 92.7 fL (ref 80.0–100.0)
Platelets: 184 10*3/uL (ref 150–400)
RBC: 3.83 MIL/uL — ABNORMAL LOW (ref 3.87–5.11)
RDW: 14.3 % (ref 11.5–15.5)
WBC: 4.3 10*3/uL (ref 4.0–10.5)
nRBC: 0 % (ref 0.0–0.2)

## 2022-07-31 LAB — HCG, QUANTITATIVE, PREGNANCY: hCG, Beta Chain, Quant, S: <1 m[IU]/mL (ref <5)

## 2022-07-31 MED ORDER — IOHEXOL 300 MG/ML  SOLN
100.0000 mL | Freq: Once | INTRAMUSCULAR | Status: AC | PRN
  Administered 2022-07-31: 100 mL via INTRAVENOUS
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
## 2022-08-22 ENCOUNTER — Emergency Department (HOSPITAL_COMMUNITY)
Admission: EM | Admit: 2022-08-22 | Discharge: 2022-08-22 | Disposition: A | Discharge status: Home / Self Care | Payer: Medicaid Other | Attending: Emergency Medicine | Admitting: Emergency Medicine

## 2022-08-22 ENCOUNTER — Other Ambulatory Visit: Payer: Self-pay

## 2022-08-22 ENCOUNTER — Emergency Department (HOSPITAL_COMMUNITY): Payer: Medicaid Other

## 2022-08-22 DIAGNOSIS — F419 Anxiety disorder, unspecified: Secondary | ICD-10-CM | POA: Diagnosis not present

## 2022-08-22 DIAGNOSIS — I10 Essential (primary) hypertension: Secondary | ICD-10-CM

## 2022-08-22 DIAGNOSIS — Z79899 Other long term (current) drug therapy: Secondary | ICD-10-CM | POA: Insufficient documentation

## 2022-08-22 DIAGNOSIS — F410 Panic disorder [episodic paroxysmal anxiety] without agoraphobia: Secondary | ICD-10-CM | POA: Insufficient documentation

## 2022-08-22 DIAGNOSIS — R519 Headache, unspecified: Secondary | ICD-10-CM | POA: Insufficient documentation

## 2022-08-22 DIAGNOSIS — R112 Nausea with vomiting, unspecified: Secondary | ICD-10-CM | POA: Insufficient documentation

## 2022-08-22 LAB — URINALYSIS, ROUTINE W REFLEX MICROSCOPIC
Bilirubin Urine: NEGATIVE
Glucose, UA: NEGATIVE mg/dL
Hgb urine dipstick: NEGATIVE
Ketones, ur: 20 mg/dL — AB
Leukocytes,Ua: NEGATIVE
Nitrite: NEGATIVE
Protein, ur: 100 mg/dL — AB
Specific Gravity, Urine: 1.021 (ref 1.005–1.030)
Squamous Epithelial / HPF: >50 /HPF (ref 0–5)
pH: 5 (ref 5.0–8.0)

## 2022-08-22 LAB — I-STAT BETA HCG BLOOD, ED (MC, WL, AP ONLY): I-stat hCG, quantitative: <5 m[IU]/mL (ref <5)

## 2022-08-22 LAB — BASIC METABOLIC PANEL
Anion gap: 12 (ref 5–15)
BUN: 10 mg/dL (ref 6–20)
CO2: 24 mmol/L (ref 22–32)
Calcium: 9.4 mg/dL (ref 8.9–10.3)
Chloride: 105 mmol/L (ref 98–111)
Creatinine, Ser: 0.59 mg/dL (ref 0.44–1.00)
GFR, Estimated: >60 mL/min (ref >60)
Glucose, Bld: 85 mg/dL (ref 70–99)
Potassium: 3.4 mmol/L — ABNORMAL LOW (ref 3.5–5.1)
Sodium: 141 mmol/L (ref 135–145)

## 2022-08-22 LAB — CBC
HCT: 39.4 % (ref 36.0–46.0)
Hemoglobin: 13.1 g/dL (ref 12.0–15.0)
MCH: 31.6 pg (ref 26.0–34.0)
MCHC: 33.2 g/dL (ref 30.0–36.0)
MCV: 94.9 fL (ref 80.0–100.0)
Platelets: 285 10*3/uL (ref 150–400)
RBC: 4.15 MIL/uL (ref 3.87–5.11)
RDW: 13.6 % (ref 11.5–15.5)
WBC: 5.6 10*3/uL (ref 4.0–10.5)
nRBC: 0 % (ref 0.0–0.2)

## 2022-08-22 LAB — RAPID URINE DRUG SCREEN, HOSP PERFORMED
Amphetamines: NOT DETECTED
Barbiturates: NOT DETECTED
Benzodiazepines: NOT DETECTED
Cocaine: NOT DETECTED
Opiates: NOT DETECTED
Tetrahydrocannabinol: POSITIVE — AB

## 2022-08-22 LAB — ETHANOL: Alcohol, Ethyl (B): 52 mg/dL — ABNORMAL HIGH (ref <10)

## 2022-08-22 MED ORDER — ONDANSETRON 4 MG PO TBDP: 4.0000 mg | ORAL_TABLET | Freq: Three times a day (TID) | ORAL | 0 refills | Status: DC | PRN

## 2022-08-22 MED ORDER — BUSPIRONE HCL 10 MG PO TABS
15.0000 mg | ORAL_TABLET | Freq: Once | ORAL | Status: AC
  Administered 2022-08-22: 15 mg via ORAL
  Filled 2022-08-22: qty 2

## 2022-08-22 MED ORDER — HYDRALAZINE HCL 20 MG/ML IJ SOLN
10.0000 mg | Freq: Once | INTRAMUSCULAR | Status: AC
  Administered 2022-08-22: 10 mg via INTRAVENOUS
  Filled 2022-08-22: qty 1

## 2022-08-22 MED ORDER — HYDROCHLOROTHIAZIDE 25 MG PO TABS: 12.5000 mg | ORAL_TABLET | Freq: Every day | ORAL | 0 refills | Status: DC

## 2022-08-22 MED ORDER — LORAZEPAM 2 MG/ML IJ SOLN
1.0000 mg | Freq: Once | INTRAMUSCULAR | Status: AC
  Administered 2022-08-22: 1 mg via INTRAVENOUS
  Filled 2022-08-22: qty 1

## 2022-08-22 MED ORDER — CHLORDIAZEPOXIDE HCL 25 MG PO CAPS
50.0000 mg | ORAL_CAPSULE | Freq: Once | ORAL | Status: AC
  Administered 2022-08-22: 50 mg via ORAL
  Filled 2022-08-22: qty 2

## 2022-08-22 MED ORDER — ONDANSETRON HCL 4 MG/2ML IJ SOLN
4.0000 mg | Freq: Once | INTRAMUSCULAR | Status: AC
  Administered 2022-08-22: 4 mg via INTRAVENOUS
  Filled 2022-08-22: qty 2

## 2022-08-22 MED ORDER — KETOROLAC TROMETHAMINE 30 MG/ML IJ SOLN
30.0000 mg | Freq: Once | INTRAMUSCULAR | Status: AC
  Administered 2022-08-22: 30 mg via INTRAVENOUS
  Filled 2022-08-22: qty 1

## 2022-08-22 NOTE — ED Provider Notes (Signed)
[place] Provider Note   CSN: [id number] Arrival date & time: 08/22/22  1529     History  Chief Complaint  Patient presents with   Hypertension    [name] is a 47 y.o. 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  Patient reports that she started to have high blood pressure, headache, dizziness last night.  She described the headache as pain behind her eyes, constant, throbbing.  Denies history of migraines.  She reports taking ibuprofen at home with minimal improvement. She reports seeing spots out of both of her eyes.  She reports nausea and multiple episodes of vomiting.  Patient's blood pressure with EMS in the [id number] systolic.  She has never been diagnosed with hypertension and has never taken medications for it. She has a history of alcohol use disorder. She reports last drink was 2 days ago and she had only one cocktail.   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 Authorizing Provider  busPIRone (BUSPAR) 15 MG tablet Take 15 mg by mouth See admin instructions. Take one tablet by mouth three times daily and one additional tablet if needed 04/06/21  Yes [provider]  cephALEXin (KEFLEX) 500 MG capsule Take 1 capsule (500 mg total) by mouth 4 (four) times daily. 07/29/22  Yes Rex Kras, PA  ferrous sulfate 325 (65 FE) MG tablet Take 325 mg by mouth daily with breakfast.   Yes [provider]  hydrochlorothiazide (HYDRODIURIL) 25 MG tablet Take 0.5 tablets (12.5 mg total) by mouth daily. 08/22/22 09/21/22 Yes Rex Kras, PA  ibuprofen (ADVIL) 200 MG  tablet Take 400 mg by mouth every 6 (six) hours as needed for headache or moderate pain.   Yes [provider]  Multiple Vitamin (MULTIVITAMIN WITH MINERALS) TABS tablet Take 1 tablet by mouth daily.   Yes [provider]  naproxen (NAPROSYN) 500 MG tablet Take 1 tablet (500 mg total) by mouth 2 (two) times daily. 07/29/22  Yes Rex Kras, PA  ondansetron (ZOFRAN ODT) 4 MG disintegrating tablet Take 1 tablet (4 mg total) by mouth every 8 (eight) hours as needed for nausea or vomiting. 05/08/21  Yes Mickie Hillier, PA-C  sertraline (ZOLOFT) 100 MG tablet Take 100 mg by mouth every evening. 06/22/20  Yes [provider]  Vitamin D, Ergocalciferol, (DRISDOL) 1.25 MG (50000 UNIT) CAPS capsule Take 50,000 Units by mouth every Monday. 04/05/21  Yes [provider]  potassium chloride SA (KLOR-CON M) 20 MEQ tablet Take 1 tablet (20 mEq total) by mouth daily for 7 days. Patient not taking: Reported on 08/22/2022 10/26/21 11/02/21  Mesner, Corene Cornea, MD      Allergies    Patient has no known allergies.    Review of Systems   Review of Systems Negative except as per HPI.  Physical Exam Updated Vital Signs BP (!) 139/92   Pulse 98   Temp 98.4 F (36.9 C) (Oral)   Resp 20   Ht 5' 3"$  (1.6 m)   Wt 54 kg   SpO2  97%   BMI 21.09 kg/m  Physical Exam Vitals and nursing note reviewed.  Constitutional:      Appearance: Normal appearance.  HENT:     Head: Normocephalic and atraumatic.     Mouth/Throat:     Mouth: Mucous membranes are moist.  Eyes:     General: No scleral icterus. Cardiovascular:     Rate and Rhythm: Normal rate and regular rhythm.     Pulses: Normal pulses.     Heart sounds: Normal heart sounds.  Pulmonary:     Effort: Pulmonary effort is normal.     Breath sounds: Normal breath sounds.  Abdominal:     General: Abdomen is flat.     Palpations: Abdomen is soft.     Tenderness: There is no abdominal tenderness.  Musculoskeletal:        General: No  deformity.  Skin:    General: Skin is warm.     Findings: No rash.  Neurological:     General: No focal deficit present.     Mental Status: She is alert.  Psychiatric:        Mood and Affect: Mood normal.    ED Results / Procedures / Treatments   Labs (all labs ordered are listed, but only abnormal results are displayed) Labs Reviewed  BASIC METABOLIC PANEL - Abnormal; Notable for the following components:      Result Value   Potassium 3.4 (*)    All other components within normal limits  ETHANOL - Abnormal; Notable for the following components:   Alcohol, Ethyl (B) 52 (*)    All other components within normal limits  URINALYSIS, ROUTINE W REFLEX MICROSCOPIC - Abnormal; Notable for the following components:   Color, Urine AMBER (*)    APPearance CLOUDY (*)    Ketones, ur 20 (*)    Protein, ur 100 (*)    Bacteria, UA RARE (*)    All other components within normal limits  RAPID URINE DRUG SCREEN, HOSP PERFORMED - Abnormal; Notable for the following components:   Tetrahydrocannabinol POSITIVE (*)    All other components within normal limits  CBC  I-STAT BETA HCG BLOOD, ED ([facility], [facility], [facility] ONLY)    EKG None  Radiology CT Head Wo Contrast  Result Date: 08/22/2022 CLINICAL DATA:  Headache with vision changes. EXAM: CT HEAD WITHOUT CONTRAST TECHNIQUE: Contiguous axial images were obtained from the base of the skull through the vertex without intravenous contrast. RADIATION DOSE REDUCTION: This exam was performed according to the departmental dose-optimization program which includes automated exposure control, adjustment of the mA and/or kV according to patient size and/or use of iterative reconstruction technique. COMPARISON:  Head CT 07/29/2022 FINDINGS: Brain: No evidence of acute infarction, hemorrhage, hydrocephalus, extra-axial collection or mass lesion/mass effect. Vascular: No hyperdense vessel or unexpected calcification. Skull: Normal. Negative for fracture or focal lesion.  Sinuses/Orbits: No acute finding. Other: None. IMPRESSION: No acute intracranial abnormality. Electronically Signed   By: Ronney Asters M.D.   On: 08/22/2022 18:10    Procedures Procedures    Medications Ordered in ED Medications  ketorolac (TORADOL) 30 MG/ML injection 30 mg (30 mg Intravenous Given 08/22/22 1628)  ondansetron (ZOFRAN) injection 4 mg (4 mg Intravenous Given 08/22/22 1628)  hydrALAZINE (APRESOLINE) injection 10 mg (10 mg Intravenous Given 08/22/22 1627)  busPIRone (BUSPAR) tablet 15 mg (15 mg Oral Given 08/22/22 1637)  LORazepam (ATIVAN) injection 1 mg (1 mg Intravenous Given 08/22/22 1900)  chlordiazePOXIDE (LIBRIUM) capsule 50 mg (50 mg Oral Given  08/22/22 1859)    ED Course/ Medical Decision Making/ A&P                             Medical Decision Making Amount and/or Complexity of Data Reviewed Labs: ordered. Radiology: ordered.  Risk Prescription drug management.   This patient presents to the ED for hypertension, panic attack, this involves an extensive number of treatment options, and is a complaint that carries with a high risk of complications and morbidity.  The differential diagnosis includes hypertensive urgency/emergency, ICH, primary hypertension, alcohol withdrawal.  This is not an exhaustive list.  Lab tests: I ordered and personally interpreted labs.  The pertinent results include: WBC unremarkable. Hbg unremarkable. Platelets unremarkable. Electrolytes unremarkable. BUN, creatinine unremarkable.  Ethanol level 52.  Urine drug screen positive for tetrahydrocannabinol.  Imaging studies: I ordered imaging studies. I personally reviewed, interpreted imaging and agree with the radiologist's interpretations. The results include: CT head showed no acute abnormalities.  Problem list/ ED course/ Critical interventions/ Medical management: HPI: See above Vital signs within normal range and stable throughout visit. Laboratory/imaging studies significant for:  See above. On physical examination, patient is afebrile and appears in no acute distress. Patient presents in alcohol withdrawal symptoms last drink was 48 hours ago. Patient tachycardic with tremors. Patient denies any tactile, auditor or visual hallucinations, AAOx4. Patient denies any history of withdrawal seizures, ICU admissions, or delirium tremens in past. Patient treated with benzos here and alcohol withdrawal resolved on time of discharge.  Patient with no history of hypertension also complains of high blood pressures at home.  She reports headache, dizziness and pain behind her eyes.  CT scan of the head was negative for bleeding.  Hydralazine ordered.  Patient improved after this medication. I sent an Rx of hydrochlorothiazide for blood pressure control. Advised patient to take Tylenol/ibuprofen/naproxen for pain, follow-up with primary care physician for further evaluation and management, return to the ER if new or worsening symptoms. I have reviewed the patient home medicines and have made adjustments as needed.  Cardiac monitoring/EKG: The patient was maintained on a cardiac monitor.  I personally reviewed and interpreted the cardiac monitor which showed an underlying rhythm of: sinus rhythm.  Additional history obtained: External records from outside source obtained and reviewed including: Chart review including previous notes, labs, imaging.  Disposition Continued outpatient therapy. Follow-up with PCP recommended for reevaluation of symptoms. Treatment plan discussed with patient.  Pt acknowledged understanding was agreeable to the plan. Worrisome signs and symptoms were discussed with patient, and patient acknowledged understanding to return to the ED if they noticed these signs and symptoms. Patient was stable upon discharge.   This chart was dictated using voice recognition software.  Despite best efforts to proofread,  errors can occur which can change the documentation meaning.           Final Clinical Impression(s) / ED Diagnoses Final diagnoses:  Hypertension, unspecified type  Anxiety  Panic attack    Rx / DC Orders ED Discharge Orders          Ordered    hydrochlorothiazide (HYDRODIURIL) 25 MG tablet  Daily        08/22/22 2039              Rex Kras, [place] 08/22/22 2138    Godfrey Pick, MD 08/23/22 0157

## 2022-08-22 NOTE — ED Notes (Signed)
Patient given discharge instructions and follow up care. Patient verbalized understanding. IV removed with catheter intact. Patient ambulatory out of ED with family.

## 2022-08-22 NOTE — ED Triage Notes (Signed)
Patient BIB EMS from home c/o hypertension. Per report pt BP was 198/110 at home. Pt c/o nausea,  vomitting and headache since yesterday. ETOH onboard.   CBG 96 CBG 198/110 HR 70 RR 20 O2sat 99% on RA

## 2022-08-22 NOTE — Discharge Instructions (Signed)
Please take your medications as prescribed. Take tylenol/ibuprofen for pain. I recommend close follow-up with PCP for reevaluation.  Please do not hesitate to return to emergency department if worrisome signs symptoms we discussed become apparent.

## 2023-02-26 ENCOUNTER — Other Ambulatory Visit: Payer: Self-pay

## 2023-02-26 ENCOUNTER — Encounter (HOSPITAL_COMMUNITY): Payer: Self-pay | Admitting: Emergency Medicine

## 2023-02-26 ENCOUNTER — Emergency Department (HOSPITAL_COMMUNITY): Payer: MEDICAID

## 2023-02-26 ENCOUNTER — Emergency Department (HOSPITAL_COMMUNITY)
Admission: EM | Admit: 2023-02-26 | Discharge: 2023-02-26 | Disposition: A | Discharge status: Home / Self Care | Payer: MEDICAID | Attending: Emergency Medicine | Admitting: Emergency Medicine

## 2023-02-26 DIAGNOSIS — S29001A Unspecified injury of muscle and tendon of front wall of thorax, initial encounter: Secondary | ICD-10-CM | POA: Diagnosis present

## 2023-02-26 DIAGNOSIS — Z20822 Contact with and (suspected) exposure to covid-19: Secondary | ICD-10-CM | POA: Diagnosis not present

## 2023-02-26 DIAGNOSIS — R0602 Shortness of breath: Secondary | ICD-10-CM | POA: Insufficient documentation

## 2023-02-26 DIAGNOSIS — R0789 Other chest pain: Secondary | ICD-10-CM

## 2023-02-26 DIAGNOSIS — S20211A Contusion of right front wall of thorax, initial encounter: Secondary | ICD-10-CM | POA: Insufficient documentation

## 2023-02-26 LAB — SARS CORONAVIRUS 2 BY RT PCR: SARS Coronavirus 2 by RT PCR: NEGATIVE

## 2023-02-26 LAB — BASIC METABOLIC PANEL
Anion gap: 19 — ABNORMAL HIGH (ref 5–15)
BUN: 10 mg/dL (ref 6–20)
CO2: 23 mmol/L (ref 22–32)
Calcium: 9.5 mg/dL (ref 8.9–10.3)
Chloride: 99 mmol/L (ref 98–111)
Creatinine, Ser: 0.95 mg/dL (ref 0.44–1.00)
GFR, Estimated: >60 mL/min (ref >60)
Glucose, Bld: 144 mg/dL — ABNORMAL HIGH (ref 70–99)
Potassium: 3.5 mmol/L (ref 3.5–5.1)
Sodium: 141 mmol/L (ref 135–145)

## 2023-02-26 LAB — CBC
HCT: 39 % (ref 36.0–46.0)
Hemoglobin: 13.1 g/dL (ref 12.0–15.0)
MCH: 30.3 pg (ref 26.0–34.0)
MCHC: 33.6 g/dL (ref 30.0–36.0)
MCV: 90.1 fL (ref 80.0–100.0)
Platelets: 282 10*3/uL (ref 150–400)
RBC: 4.33 MIL/uL (ref 3.87–5.11)
RDW: 14.8 % (ref 11.5–15.5)
WBC: 4.8 10*3/uL (ref 4.0–10.5)
nRBC: 0 % (ref 0.0–0.2)

## 2023-02-26 LAB — ETHANOL: Alcohol, Ethyl (B): 233 mg/dL — ABNORMAL HIGH (ref <10)

## 2023-02-26 LAB — HEPATIC FUNCTION PANEL
ALT: 30 U/L (ref 0–44)
AST: 69 U/L — ABNORMAL HIGH (ref 15–41)
Albumin: 4.3 g/dL (ref 3.5–5.0)
Alkaline Phosphatase: 61 U/L (ref 38–126)
Bilirubin, Direct: 0.1 mg/dL (ref 0.0–0.2)
Indirect Bilirubin: 0.9 mg/dL (ref 0.3–0.9)
Total Bilirubin: 1 mg/dL (ref 0.3–1.2)
Total Protein: 8.2 g/dL — ABNORMAL HIGH (ref 6.5–8.1)

## 2023-02-26 LAB — HCG, SERUM, QUALITATIVE: Preg, Serum: NEGATIVE

## 2023-02-26 LAB — TROPONIN I (HIGH SENSITIVITY)
Troponin I (High Sensitivity): 4 ng/L (ref <18)
Troponin I (High Sensitivity): 4 ng/L (ref <18)

## 2023-02-26 LAB — LIPASE, BLOOD: Lipase: 33 U/L (ref 11–51)

## 2023-02-26 MED ORDER — KETOROLAC TROMETHAMINE 30 MG/ML IJ SOLN
30.0000 mg | Freq: Once | INTRAMUSCULAR | Status: AC
  Administered 2023-02-26: 30 mg via INTRAVENOUS
  Filled 2023-02-26: qty 1

## 2023-02-26 MED ORDER — HYDROCORTISONE 1 % EX CREA
TOPICAL_CREAM | Freq: Once | CUTANEOUS | Status: AC
  Filled 2023-02-26: qty 28

## 2023-02-26 MED ORDER — SODIUM CHLORIDE 0.9 % IV BOLUS
1000.0000 mL | Freq: Once | INTRAVENOUS | Status: AC
  Administered 2023-02-26: 1000 mL via INTRAVENOUS

## 2023-02-26 MED ORDER — LIDOCAINE 5 % EX PTCH
1.0000 | MEDICATED_PATCH | CUTANEOUS | Status: DC
  Administered 2023-02-26: 1 via TRANSDERMAL
  Filled 2023-02-26: qty 1

## 2023-02-26 NOTE — Discharge Instructions (Addendum)
As discussed, your imaging was unremarkable.  No signs of head or neck injury.  Your labs and EKG are not indicative of a heart attack.  Alternate between Tylenol and Ibuprofen every 4 hours as needed for chest pain.  You can pick up hydrocortisone cream over-the-counter at any pharmacy if you are your pain persists, despite the Tylenol and Ibuprofen use.  Please follow-up with your primary care provider in 3 to 5 days for reevaluation of your symptoms.  Get help right away if: You feel sick to your stomach (nauseous) or you throw up (vomit). You feel sweaty or light-headed. You have a cough with mucus from your lungs (sputum) or you cough up blood. You are short of breath.
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
## 2023-03-01 ENCOUNTER — Other Ambulatory Visit: Payer: Self-pay

## 2023-03-01 ENCOUNTER — Emergency Department (HOSPITAL_COMMUNITY): Payer: MEDICAID

## 2023-03-01 ENCOUNTER — Encounter (HOSPITAL_COMMUNITY): Payer: Self-pay

## 2023-03-01 ENCOUNTER — Emergency Department (HOSPITAL_COMMUNITY)
Admission: EM | Admit: 2023-03-01 | Discharge: 2023-03-01 | Disposition: A | Discharge status: Home / Self Care | Payer: MEDICAID | Attending: Student | Admitting: Student

## 2023-03-01 DIAGNOSIS — R7401 Elevation of levels of liver transaminase levels: Secondary | ICD-10-CM | POA: Diagnosis not present

## 2023-03-01 DIAGNOSIS — F17210 Nicotine dependence, cigarettes, uncomplicated: Secondary | ICD-10-CM | POA: Insufficient documentation

## 2023-03-01 DIAGNOSIS — R509 Fever, unspecified: Secondary | ICD-10-CM | POA: Diagnosis not present

## 2023-03-01 DIAGNOSIS — R110 Nausea: Secondary | ICD-10-CM | POA: Diagnosis not present

## 2023-03-01 DIAGNOSIS — Z1152 Encounter for screening for COVID-19: Secondary | ICD-10-CM | POA: Diagnosis not present

## 2023-03-01 DIAGNOSIS — S20219A Contusion of unspecified front wall of thorax, initial encounter: Secondary | ICD-10-CM

## 2023-03-01 DIAGNOSIS — R079 Chest pain, unspecified: Secondary | ICD-10-CM | POA: Insufficient documentation

## 2023-03-01 DIAGNOSIS — R0789 Other chest pain: Secondary | ICD-10-CM

## 2023-03-01 DIAGNOSIS — R1011 Right upper quadrant pain: Secondary | ICD-10-CM | POA: Insufficient documentation

## 2023-03-01 LAB — CBC WITH DIFFERENTIAL/PLATELET
Abs Immature Granulocytes: 0.01 10*3/uL (ref 0.00–0.07)
Basophils Absolute: 0.1 10*3/uL (ref 0.0–0.1)
Basophils Relative: 1 %
Eosinophils Absolute: 0 10*3/uL (ref 0.0–0.5)
Eosinophils Relative: 1 %
HCT: 35.3 % — ABNORMAL LOW (ref 36.0–46.0)
Hemoglobin: 11.8 g/dL — ABNORMAL LOW (ref 12.0–15.0)
Immature Granulocytes: 0 %
Lymphocytes Relative: 26 %
Lymphs Abs: 1 10*3/uL (ref 0.7–4.0)
MCH: 30.3 pg (ref 26.0–34.0)
MCHC: 33.4 g/dL (ref 30.0–36.0)
MCV: 90.5 fL (ref 80.0–100.0)
Monocytes Absolute: 0.4 10*3/uL (ref 0.1–1.0)
Monocytes Relative: 9 %
Neutro Abs: 2.5 10*3/uL (ref 1.7–7.7)
Neutrophils Relative %: 63 %
Platelets: 171 10*3/uL (ref 150–400)
RBC: 3.9 MIL/uL (ref 3.87–5.11)
RDW: 14.6 % (ref 11.5–15.5)
WBC: 4 10*3/uL (ref 4.0–10.5)
nRBC: 0 % (ref 0.0–0.2)

## 2023-03-01 LAB — COMPREHENSIVE METABOLIC PANEL
ALT: 27 U/L (ref 0–44)
AST: 61 U/L — ABNORMAL HIGH (ref 15–41)
Albumin: 4.4 g/dL (ref 3.5–5.0)
Alkaline Phosphatase: 62 U/L (ref 38–126)
Anion gap: 17 — ABNORMAL HIGH (ref 5–15)
BUN: 10 mg/dL (ref 6–20)
CO2: 23 mmol/L (ref 22–32)
Calcium: 9.4 mg/dL (ref 8.9–10.3)
Chloride: 97 mmol/L — ABNORMAL LOW (ref 98–111)
Creatinine, Ser: 0.8 mg/dL (ref 0.44–1.00)
GFR, Estimated: >60 mL/min (ref >60)
Glucose, Bld: 100 mg/dL — ABNORMAL HIGH (ref 70–99)
Potassium: 3.6 mmol/L (ref 3.5–5.1)
Sodium: 137 mmol/L (ref 135–145)
Total Bilirubin: 2.6 mg/dL — ABNORMAL HIGH (ref 0.3–1.2)
Total Protein: 7.8 g/dL (ref 6.5–8.1)

## 2023-03-01 LAB — SARS CORONAVIRUS 2 BY RT PCR: SARS Coronavirus 2 by RT PCR: NEGATIVE

## 2023-03-01 LAB — TROPONIN I (HIGH SENSITIVITY)
Troponin I (High Sensitivity): 4 ng/L (ref <18)
Troponin I (High Sensitivity): 6 ng/L (ref <18)

## 2023-03-01 MED ORDER — HYDROCODONE-ACETAMINOPHEN 5-325 MG PO TABS
1.0000 | ORAL_TABLET | Freq: Once | ORAL | Status: AC
  Administered 2023-03-01: 1 via ORAL
  Filled 2023-03-01: qty 1

## 2023-03-01 MED ORDER — LORAZEPAM 2 MG/ML IJ SOLN
0.5000 mg | Freq: Once | INTRAMUSCULAR | Status: AC
  Administered 2023-03-01: 0.5 mg via INTRAVENOUS
  Filled 2023-03-01: qty 1

## 2023-03-01 MED ORDER — IBUPROFEN 600 MG PO TABS: 600.0000 mg | ORAL_TABLET | Freq: Three times a day (TID) | ORAL | 0 refills | Status: DC

## 2023-03-01 MED ORDER — KETOROLAC TROMETHAMINE 15 MG/ML IJ SOLN: 15.0000 mg | Freq: Once | INTRAMUSCULAR | Status: DC

## 2023-03-01 MED ORDER — KETOROLAC TROMETHAMINE 15 MG/ML IJ SOLN
15.0000 mg | Freq: Once | INTRAMUSCULAR | Status: AC
  Administered 2023-03-01: 15 mg via INTRAVENOUS
  Filled 2023-03-01: qty 1
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
## 2023-03-01 NOTE — Discharge Instructions (Addendum)
We saw you in the ER for chest pain and abdominal pain. All the results in the ER are normal, labs and imaging. We are not sure what is causing your symptoms. The workup in the ER is not complete, and is limited to screening for life threatening and emergent conditions only, so please see a primary care doctor for further evaluation.
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
## 2023-03-28 ENCOUNTER — Other Ambulatory Visit: Payer: Self-pay

## 2023-03-28 ENCOUNTER — Ambulatory Visit (HOSPITAL_COMMUNITY)
Admission: EM | Admit: 2023-03-28 | Discharge: 2023-03-29 | Disposition: A | Discharge status: Other Acute Inpatient Hospital | Payer: MEDICAID | Attending: Psychiatry | Admitting: Psychiatry

## 2023-03-28 DIAGNOSIS — F919 Conduct disorder, unspecified: Secondary | ICD-10-CM | POA: Insufficient documentation

## 2023-03-28 DIAGNOSIS — R4689 Other symptoms and signs involving appearance and behavior: Secondary | ICD-10-CM

## 2023-03-28 DIAGNOSIS — F4310 Post-traumatic stress disorder, unspecified: Secondary | ICD-10-CM | POA: Diagnosis not present

## 2023-03-28 DIAGNOSIS — F1010 Alcohol abuse, uncomplicated: Secondary | ICD-10-CM | POA: Insufficient documentation

## 2023-03-28 DIAGNOSIS — R45851 Suicidal ideations: Secondary | ICD-10-CM | POA: Insufficient documentation

## 2023-03-28 DIAGNOSIS — Y908 Blood alcohol level of 240 mg/100 ml or more: Secondary | ICD-10-CM | POA: Insufficient documentation

## 2023-03-28 LAB — POC URINE PREG, ED: Preg Test, Ur: NEGATIVE

## 2023-03-28 LAB — CBC WITH DIFFERENTIAL/PLATELET
Abs Immature Granulocytes: 0.01 10*3/uL (ref 0.00–0.07)
Basophils Absolute: 0.1 10*3/uL (ref 0.0–0.1)
Basophils Relative: 1 %
Eosinophils Absolute: 0.1 10*3/uL (ref 0.0–0.5)
Eosinophils Relative: 3 %
HCT: 36.3 % (ref 36.0–46.0)
Hemoglobin: 12.1 g/dL (ref 12.0–15.0)
Immature Granulocytes: 0 %
Lymphocytes Relative: 39 %
Lymphs Abs: 1.5 10*3/uL (ref 0.7–4.0)
MCH: 30.5 pg (ref 26.0–34.0)
MCHC: 33.3 g/dL (ref 30.0–36.0)
MCV: 91.4 fL (ref 80.0–100.0)
Monocytes Absolute: 0.4 10*3/uL (ref 0.1–1.0)
Monocytes Relative: 10 %
Neutro Abs: 1.8 10*3/uL (ref 1.7–7.7)
Neutrophils Relative %: 47 %
Platelets: 181 10*3/uL (ref 150–400)
RBC: 3.97 MIL/uL (ref 3.87–5.11)
RDW: 14.1 % (ref 11.5–15.5)
WBC: 3.9 10*3/uL — ABNORMAL LOW (ref 4.0–10.5)
nRBC: 0 % (ref 0.0–0.2)

## 2023-03-28 LAB — POCT URINE DRUG SCREEN - MANUAL ENTRY (I-SCREEN)
POC Amphetamine UR: NOT DETECTED
POC Buprenorphine (BUP): NOT DETECTED
POC Cocaine UR: NOT DETECTED
POC Marijuana UR: POSITIVE — AB
POC Methadone UR: NOT DETECTED
POC Methamphetamine UR: NOT DETECTED
POC Morphine: NOT DETECTED
POC Oxazepam (BZO): NOT DETECTED
POC Oxycodone UR: NOT DETECTED
POC Secobarbital (BAR): NOT DETECTED

## 2023-03-28 LAB — COMPREHENSIVE METABOLIC PANEL WITH GFR
ALT: 15 U/L (ref 0–44)
AST: 40 U/L (ref 15–41)
Albumin: 4.1 g/dL (ref 3.5–5.0)
Alkaline Phosphatase: 59 U/L (ref 38–126)
Anion gap: 17 — ABNORMAL HIGH (ref 5–15)
BUN: 12 mg/dL (ref 6–20)
CO2: 25 mmol/L (ref 22–32)
Calcium: 9.1 mg/dL (ref 8.9–10.3)
Chloride: 101 mmol/L (ref 98–111)
Creatinine, Ser: 0.69 mg/dL (ref 0.44–1.00)
GFR, Estimated: >60 mL/min (ref >60)
Glucose, Bld: 102 mg/dL — ABNORMAL HIGH (ref 70–99)
Potassium: 3.2 mmol/L — ABNORMAL LOW (ref 3.5–5.1)
Sodium: 143 mmol/L (ref 135–145)
Total Bilirubin: 1 mg/dL (ref 0.3–1.2)
Total Protein: 7.2 g/dL (ref 6.5–8.1)

## 2023-03-28 LAB — TSH: TSH: 0.857 u[IU]/mL (ref 0.350–4.500)

## 2023-03-28 LAB — ETHANOL: Alcohol, Ethyl (B): 281 mg/dL — ABNORMAL HIGH (ref <10)

## 2023-03-28 MED ORDER — LORAZEPAM 1 MG PO TABS: 1.0000 mg | ORAL_TABLET | Freq: Every day | ORAL | Status: DC

## 2023-03-28 MED ORDER — ONDANSETRON 4 MG PO TBDP
4.0000 mg | ORAL_TABLET | Freq: Four times a day (QID) | ORAL | Status: DC | PRN
  Administered 2023-03-29: 4 mg via ORAL

## 2023-03-28 MED ORDER — LORAZEPAM 1 MG PO TABS: 1.0000 mg | ORAL_TABLET | Freq: Four times a day (QID) | ORAL | Status: DC | PRN

## 2023-03-28 MED ORDER — POTASSIUM CHLORIDE CRYS ER 20 MEQ PO TBCR
20.0000 meq | EXTENDED_RELEASE_TABLET | Freq: Two times a day (BID) | ORAL | Status: AC
  Administered 2023-03-29 (×2): 20 meq via ORAL
  Filled 2023-03-28 (×2): qty 1

## 2023-03-28 MED ORDER — LORAZEPAM 1 MG PO TABS
1.0000 mg | ORAL_TABLET | ORAL | Status: AC | PRN
  Administered 2023-03-29: 1 mg via ORAL
  Filled 2023-03-28: qty 1

## 2023-03-28 MED ORDER — LORAZEPAM 1 MG PO TABS: 1.0000 mg | ORAL_TABLET | Freq: Two times a day (BID) | ORAL | Status: DC

## 2023-03-28 MED ORDER — THIAMINE MONONITRATE 100 MG PO TABS
100.0000 mg | ORAL_TABLET | Freq: Every day | ORAL | Status: DC
  Administered 2023-03-29: 100 mg via ORAL
  Filled 2023-03-28: qty 1

## 2023-03-28 MED ORDER — ALUM & MAG HYDROXIDE-SIMETH 200-200-20 MG/5ML PO SUSP: 30.0000 mL | ORAL | Status: DC | PRN

## 2023-03-28 MED ORDER — BUSPIRONE HCL 15 MG PO TABS
15.0000 mg | ORAL_TABLET | Freq: Three times a day (TID) | ORAL | Status: DC
  Administered 2023-03-29 (×2): 15 mg via ORAL
  Filled 2023-03-28 (×2): qty 1

## 2023-03-28 MED ORDER — LORAZEPAM 1 MG PO TABS: 1.0000 mg | ORAL_TABLET | Freq: Three times a day (TID) | ORAL | Status: DC

## 2023-03-28 MED ORDER — ADULT MULTIVITAMIN W/MINERALS CH
1.0000 | ORAL_TABLET | Freq: Every day | ORAL | Status: DC
  Administered 2023-03-29: 1 via ORAL
  Filled 2023-03-28: qty 1

## 2023-03-28 MED ORDER — HYDROXYZINE HCL 25 MG PO TABS
25.0000 mg | ORAL_TABLET | Freq: Four times a day (QID) | ORAL | Status: DC | PRN
  Administered 2023-03-29: 25 mg via ORAL
  Filled 2023-03-28: qty 1

## 2023-03-28 MED ORDER — LOPERAMIDE HCL 2 MG PO CAPS: 2.0000 mg | ORAL_CAPSULE | ORAL | Status: DC | PRN

## 2023-03-28 MED ORDER — OLANZAPINE 10 MG PO TBDP: 10.0000 mg | ORAL_TABLET | Freq: Three times a day (TID) | ORAL | Status: DC | PRN

## 2023-03-28 MED ORDER — ZIPRASIDONE MESYLATE 20 MG IM SOLR: 20.0000 mg | INTRAMUSCULAR | Status: DC | PRN

## 2023-03-28 MED ORDER — NAPROXEN 500 MG PO TABS
500.0000 mg | ORAL_TABLET | Freq: Two times a day (BID) | ORAL | Status: DC
  Administered 2023-03-29 (×2): 500 mg via ORAL
  Filled 2023-03-28 (×2): qty 1

## 2023-03-28 MED ORDER — SERTRALINE HCL 100 MG PO TABS: 100.0000 mg | ORAL_TABLET | Freq: Every evening | ORAL | Status: DC

## 2023-03-28 MED ORDER — MAGNESIUM HYDROXIDE 400 MG/5ML PO SUSP: 30.0000 mL | Freq: Every day | ORAL | Status: DC | PRN

## 2023-03-28 MED ORDER — THIAMINE HCL 100 MG/ML IJ SOLN
100.0000 mg | Freq: Once | INTRAMUSCULAR | Status: AC
  Administered 2023-03-29: 100 mg via INTRAMUSCULAR
  Filled 2023-03-28: qty 2

## 2023-03-28 MED ORDER — LORAZEPAM 1 MG PO TABS
1.0000 mg | ORAL_TABLET | Freq: Four times a day (QID) | ORAL | Status: DC
  Administered 2023-03-29: 1 mg via ORAL
  Filled 2023-03-28: qty 1

## 2023-03-28 MED ORDER — ONDANSETRON 4 MG PO TBDP
4.0000 mg | ORAL_TABLET | Freq: Three times a day (TID) | ORAL | Status: DC | PRN
  Filled 2023-03-28: qty 1

## 2023-03-28 MED ORDER — ACETAMINOPHEN 325 MG PO TABS
650.0000 mg | ORAL_TABLET | Freq: Four times a day (QID) | ORAL | Status: DC | PRN
  Administered 2023-03-29: 650 mg via ORAL
  Filled 2023-03-28: qty 2
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
## 2023-03-28 NOTE — ED Notes (Signed)
Pt on phone talking loudly , she is upset about being here will continue to monitor for safety

## 2023-03-29 ENCOUNTER — Other Ambulatory Visit (HOSPITAL_COMMUNITY)
Admission: EM | Admit: 2023-03-29 | Discharge: 2023-04-01 | Disposition: A | Discharge status: Home / Self Care | Payer: MEDICAID | Attending: Psychiatry | Admitting: Psychiatry

## 2023-03-29 DIAGNOSIS — F419 Anxiety disorder, unspecified: Secondary | ICD-10-CM | POA: Insufficient documentation

## 2023-03-29 DIAGNOSIS — R110 Nausea: Secondary | ICD-10-CM | POA: Insufficient documentation

## 2023-03-29 DIAGNOSIS — F1010 Alcohol abuse, uncomplicated: Secondary | ICD-10-CM | POA: Diagnosis not present

## 2023-03-29 DIAGNOSIS — F919 Conduct disorder, unspecified: Secondary | ICD-10-CM | POA: Diagnosis not present

## 2023-03-29 DIAGNOSIS — F1020 Alcohol dependence, uncomplicated: Secondary | ICD-10-CM | POA: Diagnosis present

## 2023-03-29 DIAGNOSIS — R45851 Suicidal ideations: Secondary | ICD-10-CM | POA: Diagnosis not present

## 2023-03-29 DIAGNOSIS — F1090 Alcohol use, unspecified, uncomplicated: Secondary | ICD-10-CM

## 2023-03-29 DIAGNOSIS — F4310 Post-traumatic stress disorder, unspecified: Secondary | ICD-10-CM | POA: Diagnosis not present

## 2023-03-29 MED ORDER — TRAZODONE HCL 50 MG PO TABS
50.0000 mg | ORAL_TABLET | Freq: Every evening | ORAL | Status: DC | PRN
  Administered 2023-03-29 – 2023-03-31 (×3): 50 mg via ORAL
  Filled 2023-03-29 (×3): qty 1

## 2023-03-29 MED ORDER — ONDANSETRON 4 MG PO TBDP
4.0000 mg | ORAL_TABLET | Freq: Four times a day (QID) | ORAL | Status: DC | PRN
  Administered 2023-03-29 (×2): 4 mg via ORAL
  Filled 2023-03-29 (×2): qty 1

## 2023-03-29 MED ORDER — MAGNESIUM HYDROXIDE 400 MG/5ML PO SUSP: 30.0000 mL | Freq: Every day | ORAL | Status: DC | PRN

## 2023-03-29 MED ORDER — BUSPIRONE HCL 15 MG PO TABS: 15.0000 mg | ORAL_TABLET | Freq: Three times a day (TID) | ORAL | Status: DC

## 2023-03-29 MED ORDER — ALUM & MAG HYDROXIDE-SIMETH 200-200-20 MG/5ML PO SUSP: 30.0000 mL | ORAL | Status: DC | PRN

## 2023-03-29 MED ORDER — CHLORDIAZEPOXIDE HCL 25 MG PO CAPS
25.0000 mg | ORAL_CAPSULE | Freq: Four times a day (QID) | ORAL | Status: AC
  Administered 2023-03-29 – 2023-03-30 (×4): 25 mg via ORAL
  Filled 2023-03-29 (×4): qty 1

## 2023-03-29 MED ORDER — BUSPIRONE HCL 15 MG PO TABS: 15.0000 mg | ORAL_TABLET | ORAL | Status: DC

## 2023-03-29 MED ORDER — THIAMINE MONONITRATE 100 MG PO TABS
100.0000 mg | ORAL_TABLET | Freq: Every day | ORAL | Status: DC
  Administered 2023-03-30 – 2023-04-01 (×3): 100 mg via ORAL
  Filled 2023-03-29 (×3): qty 1

## 2023-03-29 MED ORDER — CHLORDIAZEPOXIDE HCL 25 MG PO CAPS: 25.0000 mg | ORAL_CAPSULE | Freq: Four times a day (QID) | ORAL | Status: DC | PRN

## 2023-03-29 MED ORDER — ACETAMINOPHEN 325 MG PO TABS
650.0000 mg | ORAL_TABLET | Freq: Four times a day (QID) | ORAL | Status: DC | PRN
  Administered 2023-03-29 – 2023-04-01 (×7): 650 mg via ORAL
  Filled 2023-03-29 (×7): qty 2

## 2023-03-29 MED ORDER — CLONIDINE HCL 0.1 MG PO TABS
0.1000 mg | ORAL_TABLET | Freq: Once | ORAL | Status: AC
  Administered 2023-03-29: 0.1 mg via ORAL
  Filled 2023-03-29: qty 1

## 2023-03-29 MED ORDER — LOPERAMIDE HCL 2 MG PO CAPS: 2.0000 mg | ORAL_CAPSULE | ORAL | Status: DC | PRN

## 2023-03-29 MED ORDER — CHLORDIAZEPOXIDE HCL 25 MG PO CAPS
25.0000 mg | ORAL_CAPSULE | ORAL | Status: DC
  Administered 2023-03-31: 25 mg via ORAL
  Filled 2023-03-29 (×2): qty 1

## 2023-03-29 MED ORDER — HYDROXYZINE HCL 25 MG PO TABS
25.0000 mg | ORAL_TABLET | Freq: Three times a day (TID) | ORAL | Status: DC | PRN
  Administered 2023-03-29 – 2023-03-31 (×3): 25 mg via ORAL
  Filled 2023-03-29 (×3): qty 1

## 2023-03-29 MED ORDER — POTASSIUM CHLORIDE CRYS ER 20 MEQ PO TBCR
40.0000 meq | EXTENDED_RELEASE_TABLET | Freq: Once | ORAL | Status: AC
  Administered 2023-03-29: 40 meq via ORAL
  Filled 2023-03-29: qty 2

## 2023-03-29 MED ORDER — ADULT MULTIVITAMIN W/MINERALS CH
1.0000 | ORAL_TABLET | Freq: Every day | ORAL | Status: DC
  Administered 2023-03-30 – 2023-04-01 (×3): 1 via ORAL
  Filled 2023-03-29 (×3): qty 1

## 2023-03-29 MED ORDER — CHLORDIAZEPOXIDE HCL 25 MG PO CAPS
25.0000 mg | ORAL_CAPSULE | Freq: Three times a day (TID) | ORAL | Status: AC
  Administered 2023-03-30 – 2023-03-31 (×3): 25 mg via ORAL
  Filled 2023-03-29 (×3): qty 1

## 2023-03-29 MED ORDER — CHLORDIAZEPOXIDE HCL 25 MG PO CAPS
25.0000 mg | ORAL_CAPSULE | Freq: Every day | ORAL | Status: AC
  Administered 2023-04-01: 25 mg via ORAL
  Filled 2023-03-29: qty 1

## 2023-03-29 NOTE — ED Provider Notes (Signed)
Facility Based Crisis Admission H&P  Date: 03/29/23 Patient Name: [name] MRN: [id number] Chief Complaint: alcohol detox  Diagnoses:  Final diagnoses:  Alcohol use disorder    HPI: [name] is a 47 year old female with alcohol use disorder who presents involuntary (IVC from her son) to the [facility] for alcohol detox.  She remains under IVC at this time. She denies having drinking issues but states her son, daughter, and father are all concerned about how much she drinks. Patient states she consumes a wine cooler or a glass of wine from time to time. [name] states she feels bullied by her family instead of supported, and she wants them to understand that "this journey to recovery is mine". She reports that she attends zoom/ phone meetings with her sponsor but she hasn't been able to attend one in the past two weeks because she is in the process of relocating. Patient states she only had  glass of wine yesterday, but she was informed that her blood alcohol level was 284 on admission, indicating she may have drunk more than she admits to. She admits to getting 5-6 hours of sleep per night and states her appetite has been good. [name] lives with her boyfriend and her 84 year old son, while her 15-yr. old son stays with his grandmother. She also states she is not taking any medications at this time.   Patient seen this AM. Patient is agreeable to detoxing from alcohol in the [facility] and going to [facility] afterwards. She notes that she does not prefer pursuing a residential treat at this time because she has a 46 year old she has to care for. She denies SI, HI, and AVH. Discussed how we will have medications to aid with the detox process. Patient seems to be minimizing symptoms and alcohol use history which is evident from the reported amount of alcohol use and EtOH level upon presenting.   PHQ 2-9:   Flowsheet Row ED from 03/29/2023 in [facility] ED from  03/28/2023 in [facility] ED from 03/01/2023 in [facility] Emergency Department at [facility]  C-SSRS RISK CATEGORY No Risk No Risk No Risk         Total Time spent with patient: 30 minutes  Musculoskeletal  Strength & Muscle Tone: within normal limits Gait & Station: normal Patient leans: N/A  Psychiatric Specialty Exam  Presentation General Appearance:  Casual; Appropriate for Environment  Eye Contact: Good  Speech: Clear and Coherent  Speech Volume: Normal  Handedness: Right   Mood and Affect  Mood: Euthymic  Affect: Congruent   Thought Process  Thought Processes: Coherent  Descriptions of Associations:Intact  Orientation:Full (Time, Place and Person)  Thought Content:Logical; WDL  Diagnosis of Schizophrenia or Schizoaffective disorder in past: No   Hallucinations:Hallucinations: None  Ideas of Reference:None  Suicidal Thoughts:Suicidal Thoughts: No  Homicidal Thoughts:Homicidal Thoughts: No   Sensorium  Memory: Remote Good  Judgment: Impaired  Insight: Lacking   Executive Functions  Concentration: Good  Attention Span: Good  Recall: Good  Fund of Knowledge: Good  Language: Good   Psychomotor Activity  Psychomotor Activity: Psychomotor Activity: Normal   Assets  Assets: Communication Skills   Sleep  Sleep: Sleep: Good Number of Hours of Sleep: 8   Nutritional Assessment (For OBS and FBC admissions only) Has the patient had a weight loss or gain of 10 pounds or more in the last 3 months?: No Has the patient had a decrease in  food intake/or appetite?: No Does the patient have dental problems?: No Does the patient have eating habits or behaviors that may be indicators of an eating disorder including binging or inducing vomiting?: No Has the patient recently lost weight without trying?: 0 Has the patient been eating poorly because of a decreased appetite?: 0 Malnutrition  Screening Tool Score: 0    Physical Exam Vitals reviewed.  Constitutional:      Appearance: Normal appearance.  HENT:     Head: Normocephalic and atraumatic.  Pulmonary:     Effort: Pulmonary effort is normal.  Musculoskeletal:        General: Normal range of motion.  Neurological:     Mental Status: She is alert.    Review of Systems  Constitutional:  Negative for chills and fever.  Respiratory:  Negative for cough.   Cardiovascular:  Negative for chest pain and palpitations.  Gastrointestinal:  Negative for nausea and vomiting.  Neurological:  Negative for headaches.    There were no vitals taken for this visit. There is no height or weight on file to calculate BMI.  Past Psychiatric History: AUD (tried Zoloft in the past and had fair repsonse. Did not want to restart at this time)  Is the patient at risk to self? Yes  Has the patient been a risk to self in the past 6 months? No .    Has the patient been a risk to self within the distant past? No   Is the patient a risk to others? No   Has the patient been a risk to others in the past 6 months? No   Has the patient been a risk to others within the distant past? No   Past Medical History: Rheumatological condition with hands Family History: Per patient her father has a history of PTSD  Social History: Lives in [place] with her father, 2 sons (ages 21 and 61) Unemployed Partner of 3 years is her support system Denies legal history  Last Labs:  Admission on 03/28/2023, Discharged on 03/29/2023  Component Date Value Ref Range Status   WBC 03/28/2023 3.9 (L)  4.0 - 10.5 K/uL Final   RBC 03/28/2023 3.97  3.87 - 5.11 MIL/uL Final   Hemoglobin 03/28/2023 12.1  12.0 - 15.0 g/dL Final   HCT 16/04/9603 36.3  36.0 - 46.0 % Final   MCV 03/28/2023 91.4  80.0 - 100.0 fL Final   MCH 03/28/2023 30.5  26.0 - 34.0 pg Final   MCHC 03/28/2023 33.3  30.0 - 36.0 g/dL Final   RDW 54/03/8118 14.1  11.5 - 15.5 % Final   Platelets  03/28/2023 181  150 - 400 K/uL Final   nRBC 03/28/2023 0.0  0.0 - 0.2 % Final   Neutrophils Relative % 03/28/2023 47  % Final   Neutro Abs 03/28/2023 1.8  1.7 - 7.7 K/uL Final   Lymphocytes Relative 03/28/2023 39  % Final   Lymphs Abs 03/28/2023 1.5  0.7 - 4.0 K/uL Final   Monocytes Relative 03/28/2023 10  % Final   Monocytes Absolute 03/28/2023 0.4  0.1 - 1.0 K/uL Final   Eosinophils Relative 03/28/2023 3  % Final   Eosinophils Absolute 03/28/2023 0.1  0.0 - 0.5 K/uL Final   Basophils Relative 03/28/2023 1  % Final   Basophils Absolute 03/28/2023 0.1  0.0 - 0.1 K/uL Final   Immature Granulocytes 03/28/2023 0  % Final   Abs Immature Granulocytes 03/28/2023 0.01  0.00 - 0.07 K/uL Final   Performed  at [facility] Lab, [address], [place], [place] [address]   Sodium 03/28/2023 143  135 - 145 mmol/L Final   Potassium 03/28/2023 3.2 (L)  3.5 - 5.1 mmol/L Final   Chloride 03/28/2023 101  98 - 111 mmol/L Final   CO2 03/28/2023 25  22 - 32 mmol/L Final   Glucose, Bld 03/28/2023 102 (H)  70 - 99 mg/dL Final   Glucose reference range applies only to samples taken after fasting for at least 8 hours.   BUN 03/28/2023 12  6 - 20 mg/dL Final   Creatinine, Ser 03/28/2023 0.69  0.44 - 1.00 mg/dL Final   Calcium 60/45/4098 9.1  8.9 - 10.3 mg/dL Final   Total Protein 11/91/4782 7.2  6.5 - 8.1 g/dL Final   Albumin 95/62/1308 4.1  3.5 - 5.0 g/dL Final   AST 65/78/4696 40  15 - 41 U/L Final   ALT 03/28/2023 15  0 - 44 U/L Final   Alkaline Phosphatase 03/28/2023 59  38 - 126 U/L Final   Total Bilirubin 03/28/2023 1.0  0.3 - 1.2 mg/dL Final   GFR, Estimated 03/28/2023 >60  >60 mL/min Final   Comment: (NOTE) Calculated using the CKD-EPI Creatinine Equation (2021)    Anion gap 03/28/2023 17 (H)  5 - 15 Final   Performed at [facility], [address] [place], [place] [address]   Alcohol, Ethyl (B) 03/28/2023 281 (H)  <10 mg/dL Final   Comment: (NOTE) Lowest detectable limit for serum  alcohol is 10 mg/dL.  For medical purposes only. Performed at [facility], [address], [place], [place] [address]    TSH 03/28/2023 0.857  0.350 - 4.500 uIU/mL Final   Comment: Performed by a 3rd Generation assay with a functional sensitivity of <=0.01 uIU/mL. Performed at [facility], [address], [place], [place] [address]    Preg Test, Ur 03/28/2023 Negative  Negative Final   POC Amphetamine UR 03/28/2023 None Detected  NONE DETECTED (Cut Off Level 1000 ng/mL) Final   POC Secobarbital (BAR) 03/28/2023 None Detected  NONE DETECTED (Cut Off Level 300 ng/mL) Final   POC Buprenorphine (BUP) 03/28/2023 None Detected  NONE DETECTED (Cut Off Level 10 ng/mL) Final   POC Oxazepam (BZO) 03/28/2023 None Detected  NONE DETECTED (Cut Off Level 300 ng/mL) Final   POC Cocaine UR 03/28/2023 None Detected  NONE DETECTED (Cut Off Level 300 ng/mL) Final   POC Methamphetamine UR 03/28/2023 None Detected  NONE DETECTED (Cut Off Level 1000 ng/mL) Final   POC Morphine 03/28/2023 None Detected  NONE DETECTED (Cut Off Level 300 ng/mL) Final   POC Methadone UR 03/28/2023 None Detected  NONE DETECTED (Cut Off Level 300 ng/mL) Final   POC Oxycodone UR 03/28/2023 None Detected  NONE DETECTED (Cut Off Level 100 ng/mL) Final   POC Marijuana UR 03/28/2023 Positive (A)  NONE DETECTED (Cut Off Level 50 ng/mL) Final  Admission on 03/01/2023, Discharged on 03/01/2023  Component Date Value Ref Range Status   SARS Coronavirus 2 by RT PCR 03/01/2023 NEGATIVE  NEGATIVE Final   Performed at [facility] Lab, [address] [place], [place] [address]   Sodium 03/01/2023 137  135 - 145 mmol/L Final   Potassium 03/01/2023 3.6  3.5 - 5.1 mmol/L Final   Chloride 03/01/2023 97 (L)  98 - 111 mmol/L Final   CO2 03/01/2023 23  22 - 32 mmol/L Final   Glucose, Bld 03/01/2023 100 (H)  70 -  99 mg/dL Final   Glucose reference range applies only to samples taken after fasting for at least 8 hours.   BUN 03/01/2023  10  6 - 20 mg/dL Final   Creatinine, Ser 03/01/2023 0.80  0.44 - 1.00 mg/dL Final   Calcium 63/07/6008 9.4  8.9 - 10.3 mg/dL Final   Total Protein 93/23/5573 7.8  6.5 - 8.1 g/dL Final   Albumin 22/08/5425 4.4  3.5 - 5.0 g/dL Final   AST 12/31/7626 61 (H)  15 - 41 U/L Final   ALT 03/01/2023 27  0 - 44 U/L Final   Alkaline Phosphatase 03/01/2023 62  38 - 126 U/L Final   Total Bilirubin 03/01/2023 2.6 (H)  0.3 - 1.2 mg/dL Final   GFR, Estimated 03/01/2023 >60  >60 mL/min Final   Comment: (NOTE) Calculated using the CKD-EPI Creatinine Equation (2021)    Anion gap 03/01/2023 17 (H)  5 - 15 Final   Performed at [facility] Lab, [address] [place], [place] [address]   WBC 03/01/2023 4.0  4.0 - 10.5 K/uL Final   RBC 03/01/2023 3.90  3.87 - 5.11 MIL/uL Final   Hemoglobin 03/01/2023 11.8 (L)  12.0 - 15.0 g/dL Final   HCT 61/60/7371 35.3 (L)  36.0 - 46.0 % Final   MCV 03/01/2023 90.5  80.0 - 100.0 fL Final   MCH 03/01/2023 30.3  26.0 - 34.0 pg Final   MCHC 03/01/2023 33.4  30.0 - 36.0 g/dL Final   RDW 01/03/9484 14.6  11.5 - 15.5 % Final   Platelets 03/01/2023 171  150 - 400 K/uL Final   nRBC 03/01/2023 0.0  0.0 - 0.2 % Final   Neutrophils Relative % 03/01/2023 63  % Final   Neutro Abs 03/01/2023 2.5  1.7 - 7.7 K/uL Final   Lymphocytes Relative 03/01/2023 26  % Final   Lymphs Abs 03/01/2023 1.0  0.7 - 4.0 K/uL Final   Monocytes Relative 03/01/2023 9  % Final   Monocytes Absolute 03/01/2023 0.4  0.1 - 1.0 K/uL Final   Eosinophils Relative 03/01/2023 1  % Final   Eosinophils Absolute 03/01/2023 0.0  0.0 - 0.5 K/uL Final   Basophils Relative 03/01/2023 1  % Final   Basophils Absolute 03/01/2023 0.1  0.0 - 0.1 K/uL Final   Immature Granulocytes 03/01/2023 0  % Final   Abs Immature Granulocytes 03/01/2023 0.01  0.00 - 0.07 K/uL Final   Performed at [facility] Lab, [address] [place], [place] [address]   Troponin I (High Sensitivity) 03/01/2023 4  <18 ng/L Final   Comment:  (NOTE) Elevated high sensitivity troponin I (hsTnI) values and significant  changes across serial measurements may suggest ACS but many other  chronic and acute conditions are known to elevate hsTnI results.  Refer to the "Links" section for chest pain algorithms and additional  guidance. Performed at [facility] Lab, [address], [place], [place] [address]    Troponin I (High Sensitivity) 03/01/2023 6  <18 ng/L Final   Comment: (NOTE) Elevated high sensitivity troponin I (hsTnI) values and significant  changes across serial measurements may suggest ACS but many other  chronic and acute conditions are known to elevate hsTnI results.  Refer to the "Links" section for chest pain algorithms and additional  guidance. Performed at [facility], [address] [place], [place] [address]   Admission on 02/26/2023, Discharged on 02/26/2023  Component Date Value Ref Range Status   SARS Coronavirus 2  by RT PCR 02/26/2023 NEGATIVE  NEGATIVE Final   Performed at [facility], [address], [place], [place] [address]   Sodium 02/26/2023 141  135 - 145 mmol/L Final   Potassium 02/26/2023 3.5  3.5 - 5.1 mmol/L Final   Chloride 02/26/2023 99  98 - 111 mmol/L Final   CO2 02/26/2023 23  22 - 32 mmol/L Final   Glucose, Bld 02/26/2023 144 (H)  70 - 99 mg/dL Final   Glucose reference range applies only to samples taken after fasting for at least 8 hours.   BUN 02/26/2023 10  6 - 20 mg/dL Final   Creatinine, Ser 02/26/2023 0.95  0.44 - 1.00 mg/dL Final   Calcium 32/35/5732 9.5  8.9 - 10.3 mg/dL Final   GFR, Estimated 02/26/2023 >60  >60 mL/min Final   Comment: (NOTE) Calculated using the CKD-EPI Creatinine Equation (2021)    Anion gap 02/26/2023 19 (H)  5 - 15 Final   Performed at [facility], [address], [place], [place] [address]   WBC 02/26/2023 4.8  4.0 - 10.5 K/uL Final   RBC 02/26/2023 4.33  3.87 - 5.11 MIL/uL Final   Hemoglobin 02/26/2023 13.1  12.0 - 15.0  g/dL Final   HCT 27/12/2374 39.0  36.0 - 46.0 % Final   MCV 02/26/2023 90.1  80.0 - 100.0 fL Final   MCH 02/26/2023 30.3  26.0 - 34.0 pg Final   MCHC 02/26/2023 33.6  30.0 - 36.0 g/dL Final   RDW 28/31/5176 14.8  11.5 - 15.5 % Final   Platelets 02/26/2023 282  150 - 400 K/uL Final   nRBC 02/26/2023 0.0  0.0 - 0.2 % Final   Performed at [facility], [address], [place], [place] [address]   Troponin I (High Sensitivity) 02/26/2023 4  <18 ng/L Final   Comment: (NOTE) Elevated high sensitivity troponin I (hsTnI) values and significant  changes across serial measurements may suggest ACS but many other  chronic and acute conditions are known to elevate hsTnI results.  Refer to the "Links" section for chest pain algorithms and additional  guidance. Performed at [facility] Lab, [address] [place], [place] [address]    Preg, Serum 02/26/2023 NEGATIVE  NEGATIVE Final   Comment:        THE SENSITIVITY OF THIS METHODOLOGY IS >10 mIU/mL. Performed at [facility], [address] [place], [place] [address]    Alcohol, Ethyl (B) 02/26/2023 233 (H)  <10 mg/dL Final   Comment: (NOTE) Lowest detectable limit for serum alcohol is 10 mg/dL.  For medical purposes only. Performed at [facility], [address] [place], [place] [address]    Troponin I (High Sensitivity) 02/26/2023 4  <18 ng/L Final   Comment: (NOTE) Elevated high sensitivity troponin I (hsTnI) values and significant  changes across serial measurements may suggest ACS but many other  chronic and acute conditions are known to elevate hsTnI results.  Refer to the "Links" section for chest pain algorithms and additional  guidance. Performed at [facility], [address], [place], [place] [address]    Total Protein 02/26/2023 8.2 (H)  6.5 - 8.1 g/dL Final   Albumin 38/18/2993 4.3  3.5 - 5.0 g/dL Final   AST 71/69/6789 69 (H)  15 - 41 U/L Final   ALT 02/26/2023 30  0 - 44 U/L Final    Alkaline Phosphatase 02/26/2023 61  38 - 126 U/L  Final   Total Bilirubin 02/26/2023 1.0  0.3 - 1.2 mg/dL Final   Bilirubin, Direct 02/26/2023 0.1  0.0 - 0.2 mg/dL Final   Indirect Bilirubin 02/26/2023 0.9  0.3 - 0.9 mg/dL Final   Performed at [facility] Lab, [address] [place], [place] [address]   Lipase 02/26/2023 33  11 - 51 U/L Final   Performed at [facility] Lab, [address], [place], [place] [address]    Allergies: Patient has no known allergies.  Medications:  Facility Ordered Medications  Medication   [COMPLETED] LORazepam (ATIVAN) tablet 1 mg   [COMPLETED] thiamine (VITAMIN B1) injection 100 mg   [COMPLETED] potassium chloride SA (KLOR-CON M) CR tablet 20 mEq   acetaminophen (TYLENOL) tablet 650 mg   alum & mag hydroxide-simeth (MAALOX/MYLANTA) 200-200-20 MG/5ML suspension 30 mL   magnesium hydroxide (MILK OF MAGNESIA) suspension 30 mL   hydrOXYzine (ATARAX) tablet 25 mg   traZODone (DESYREL) tablet 50 mg   [START ON 03/30/2023] multivitamin with minerals tablet 1 tablet   chlordiazePOXIDE (LIBRIUM) capsule 25 mg   loperamide (IMODIUM) capsule 2-4 mg   ondansetron (ZOFRAN-ODT) disintegrating tablet 4 mg   [START ON 03/30/2023] thiamine (VITAMIN B1) tablet 100 mg   chlordiazePOXIDE (LIBRIUM) capsule 25 mg   Followed by   Melene Muller ON 03/30/2023] chlordiazePOXIDE (LIBRIUM) capsule 25 mg   Followed by   Melene Muller ON 03/31/2023] chlordiazePOXIDE (LIBRIUM) capsule 25 mg   Followed by   Melene Muller ON 04/01/2023] chlordiazePOXIDE (LIBRIUM) capsule 25 mg   PTA Medications  Medication Sig   busPIRone (BUSPAR) 15 MG tablet Take 1 tablet (15 mg total) by mouth See admin instructions. Take one tablet by mouth three times daily and one additional tablet if needed    Long Term Goals: Improvement in symptoms so as ready for discharge  Short Term Goals: Patient will verbalize feelings in meetings with treatment team members., Patient will attend at least of 50% of the groups  daily., Pt will complete the PHQ9 on admission, day 3 and discharge., Patient will participate in completing the [place] Suicide Severity Rating Scale, Patient will score a low risk of violence for 24 hours prior to discharge, and Patient will take medications as prescribed daily.  Medical Decision Making  Alcohol Use Disorder Alcohol Withdrawal EtOH 281. LFTs WNL.  -Librium taper -CIWA with Ativan as needed for CIWA greater than 10 -Thiamine 100 mg IM first day and PO after that -Multivitamin with minerals daily -Tylenol 650 mg every 6 hours as needed for pain -Zofran 4 mg every 6 hours as needed for nausea or vomiting -Imodium 2 to 4 mg as needed for diarrhea or loose stools  -Maalox/Mylanta 30 mL every 4 hours as needed for indigestion -Milk of Mag 30 mL as needed for constipation  -Hydroxyzine 25 mg q6h PRN for anxiety -Trazodone 50 mg qhs PRN for sleep  Hypokalemia K is 3.2. Repleting with 40 meq KCl    Recommendations  Based on my evaluation the patient does not appear to have an emergency medical condition.  Lance Muss, MD 03/29/23  12:45 PM

## 2023-03-29 NOTE — ED Notes (Signed)
With provider in assessment rooms

## 2023-03-29 NOTE — ED Notes (Signed)
Pt is in the dayroom watching TV with peers. Pt denies SI/HI/AVH. No acute distress noted. Will continue to monitor for safety.

## 2023-03-29 NOTE — Progress Notes (Signed)
Pt was visible in the milieu and interacted peers. No distress noted or concerns voiced. Staff will monitor for pt's safety.
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
## 2023-03-29 NOTE — ED Provider Notes (Addendum)
FBC/OBS ASAP Discharge Summary  Date and Time: 03/29/2023 11:13 AM  Name: [name]  MRN:  [id number]   Discharge Diagnoses:  Final diagnoses:  Suicidal ideation  Alcohol abuse  Aggression aggravated    Subjective: On evaluation, patient is alert and oriented x 4. She speaks in a normal tone and speech is coherent. She maintains good eye contact. Her mood is euthymic and affect is anxious.Patient denies SI, HI, AVH or paranoia. She does not appear to be under the influence of any substance at this time or in acute distress.   [name] is a 47 year old AA female with a significant history of alcohol use disorder and PTSD. She remains under IVC at this time. She denies having drinking issues but states her son, daughter, and father are all concerned about how much she drinks. Patient states she consumes "a wine cooler or a glass of wine from time to time." [name] states she feels bullied by her family instead of supported, and she wants them to understand that "this journey to recovery is mine". She reports being in recovery since 2009. She reports that she attends zoom/ phone meetings with her sponsor but she hasn't been able to attend one in the past two weeks because she is in the process of relocating. Patient states she only had  glass of wine yesterday, but she was informed that her blood alcohol level was 284 on admission, indicating she may have consumed more than she admits to. She reports getting 5-6 hours of sleep per night and states her appetite has been good. [name] lives with her boyfriend and her 28 year old son, while her 15-yr. old son stays with his grandmother to attend school.   This provider Michelle Perna, NP), spoke to the patient's son and IVC petitioner to obtain collateral information. [name] states that his mother has been an alcoholic for 35 years. He states that his mom made threats to harm herself yesterday and threatened to kill herself while she was  intoxicated. He states that his mom has been drinking everyday and is under a lot of stress and tries to mask her problems with alcohol.  Stay Summary: [name] presents to the [facility] health urgent care facility via law enforcement under involuntary commitment.   Per IVC, -Respondent stated she is going to kill herself. -She drinks all day everyday from sun up to sun down. -She is under doctor's care.  Patient was kept overnight for observation and transferred to the [facility] unit for alcohol detox treatment and safety precautions.   Total Time spent with patient: 20 minutes  Past Psychiatric History: history of alcohol abuse, anxiety, and PTSD  Past Medical History: Vaginal wall cyst removal 2010, and appendicitis   Family Psychiatric History: Per patient her father has a history of PTSD  Social History: Lives with her 75 year old son and her boyfriend. Unemployed. .   Tobacco Cessation:  Prescription not provided because: patient declined   Current Medications:  No current facility-administered medications for this encounter.   Current Outpatient Medications  Medication Sig Dispense Refill   busPIRone (BUSPAR) 15 MG tablet Take 1 tablet (15 mg total) by mouth See admin instructions. Take one tablet by mouth three times daily and one additional tablet if needed      PTA Medications:  Facility Ordered Medications  Medication   [COMPLETED] LORazepam (ATIVAN) tablet 1 mg   [COMPLETED] thiamine (VITAMIN B1) injection 100 mg   [COMPLETED] potassium chloride SA (KLOR-CON M) CR tablet  20 mEq   PTA Medications  Medication Sig   busPIRone (BUSPAR) 15 MG tablet Take 1 tablet (15 mg total) by mouth See admin instructions. Take one tablet by mouth three times daily and one additional tablet if needed    Flowsheet Row ED from 03/28/2023 in [facility] ED from 03/01/2023 in [facility] Emergency Department at [facility] ED from  02/26/2023 in [facility] Emergency Department at [facility]  C-SSRS RISK CATEGORY No Risk No Risk No Risk       Musculoskeletal  Strength & Muscle Tone: within normal limits Gait & Station: normal Patient leans: N/A  Psychiatric Specialty Exam  Presentation  General Appearance:  Casual   Eye Contact: Good   Speech: Clear and Coherent   Speech Volume: Normal   Handedness: Right    Mood and Affect  Mood: Euthymic   Affect: Congruent    Thought Process  Thought Processes: Coherent   Descriptions of Associations:Intact   Orientation:Full (Time, Place and Person)   Thought Content:WDL   Diagnosis of Schizophrenia or Schizoaffective disorder in past: No   Duration of Psychotic Symptoms: No data recorded   Hallucinations:Hallucinations: None   Ideas of Reference:None   Suicidal Thoughts:Suicidal Thoughts: No   Homicidal Thoughts:Homicidal Thoughts: No    Sensorium  Memory: Immediate Good   Judgment: Poor   Insight: Fair    Art therapist  Concentration: Fair   Attention Span: Good   Recall: Good   Fund of Knowledge: Good   Language: Good    Psychomotor Activity  Psychomotor Activity: Psychomotor Activity: Normal   Assets  Assets: Communication Skills   Sleep  Sleep: Sleep: Good Number of Hours of Sleep: 8    Nutritional Assessment (For OBS and FBC admissions only) Has the patient had a weight loss or gain of 10 pounds or more in the last 3 months?: No Has the patient had a decrease in food intake/or appetite?: No Does the patient have dental problems?: No Does the patient have eating habits or behaviors that may be indicators of an eating disorder including binging or inducing vomiting?: No Has the patient recently lost weight without trying?: 0 Has the patient been eating poorly because of a decreased appetite?: 0 Malnutrition Screening Tool Score: 0   Physical Exam   Physical Exam Vitals reviewed.  HENT:     Head: Normocephalic.     Nose: Nose normal.  Eyes:     Pupils: Pupils are equal, round, and reactive to light.  Cardiovascular:     Rate and Rhythm: Normal rate.     Pulses: Normal pulses.  Pulmonary:     Effort: Pulmonary effort is normal.  Abdominal:     General: Abdomen is flat.  Musculoskeletal:        General: Normal range of motion.     Cervical back: Normal range of motion.  Neurological:     General: No focal deficit present.     Mental Status: She is alert.    Review of Systems  Constitutional: Negative.   HENT: Negative.    Eyes: Negative.   Respiratory: Negative.    Cardiovascular: Negative.   Gastrointestinal: Negative.   Genitourinary: Negative.   Musculoskeletal: Negative.   Skin: Negative.   Neurological: Negative.   Endo/Heme/Allergies: Negative.   Psychiatric/Behavioral:  The patient is nervous/anxious.    Blood pressure (!) 146/89, pulse 81, temperature 98.6 F (37 C), resp. rate 16, SpO2 100%. There is no height or weight  on file to calculate BMI.  Plan Of Care/Follow-up recommendations:  Activity:  as tolerated   Disposition: Patient to transfer to [facility] unit for alcohol detox treatment and safety precautions. First exam completed on 03/29/23 by this provider.   Flonnie Hailstone, RN 03/29/2023, 11:13 AM     Crissie Reese, RN 03/29/23 [phone]

## 2023-03-29 NOTE — Group Note (Signed)
Group Topic: Positive Affirmations  Group Date: 03/29/2023 Start Time: 0815 End Time: 0835 Facilitators: Debe Coder, NT  Department: [facility]  Number of Participants: 7  Group Focus: affirmation Treatment Modality:  Solution-Focused Therapy Interventions utilized were group exercise Purpose: express feelings  Name: [name] Date of Birth: 23-Sep-1975  MR: [id number]    Level of Participation: active Quality of Participation: engaged Interactions with others: gave feedback Mood/Affect: positive Triggers (if applicable):  Cognition: coherent/clear Progress: Moderate Response:  Plan: follow-up needed  Patients Problems:  Patient Active Problem List   Diagnosis Date Noted   Alcohol dependence (HCC) 03/29/2023   Alcohol withdrawal (HCC) 04/13/2021   Alcohol use disorder, severe, dependence (HCC) 04/12/2021

## 2023-03-29 NOTE — BH Assessment (Signed)
Comprehensive Clinical Assessment (CCA) Note  03/29/2023 [name] [id number]  Disposition: Sindy Guadeloupe, NP, recommends continuous observation for safety and stabilization with psych reassessment in the AM.   The patient demonstrates the following risk factors for suicide: Chronic risk factors for suicide include: substance use disorder. Acute risk factors for suicide include: family or marital conflict. Protective factors for this patient include: responsibility to others (children, family), coping skills, and hope for the future. Considering these factors, the overall suicide risk at this point appears to be moderate. Patient is not appropriate for outpatient follow up.  [name] is a 47 year old female presenting under IVC to [facility] due to alcohol detox and SI. Patient denied SI, HI, psychosis and drug usage. Patient states "my son is upset and called cops on me, this is his way of getting back at me". Patient states she needs no help. Patient gave no additional information. Patient gave consent to speak with son, [name], [phone]. [name] stated today while patient was intoxicated she stated she was going to kill herself, no plan stated. [name] stated patient drinks 4 locos and mini bottles of wine daily. [name] stated patient was diagnosed with Stage 4 Cancer and is currently in remission and because of the physical pain she self-medicates with alcohol. [name] stated patient also has ongoing pain in her hands that wakes her up in the middle of the night.                     Per IVC, Respondent stated she is going to kill herself. She drinks all day everyday from sun up to sun down. She is under doctor's care.   Chief Complaint:  Chief Complaint  Patient presents with   IVC   Alcohol Problem   Visit Diagnosis:  Alcohol Dependence    CCA Screening, Triage and Referral (STR)  Patient Reported Information How did you hear about [place]? Family/Friend  What Is the  Reason for Your Visit/Call Today? [name] is a 47 year old female presenting under IVC to [facility] due to alcohol detox and SI. Patient denied SI, HI, psychosis and drug usage. Patient states "my son is upset and called cops on me, this is his way of getting back at me". Patient gave no additional information. Patient gave consent to speak with son, [name], [phone]. [name] stated today while patient was intoxicated she stated she was going to kill herself, no plan stated. [name] stated patient drinks 4 locos and mini bottles of wine daily. [name] stated patient was diagnosed with Stage 4 Cancer and is currently in remission and because of the physical pain she self-medicates with alcohol. [name] stated patient also has ongoing pain in her hands that wakes her up in the middle of the night.                   Per IVC, Respondent stated she is going to kill herself. She drinks all day everyday from sun up to sun down. She is under doctor's care.  How Long Has This Been Causing You Problems? > than 6 months  What Do You Feel Would Help You the Most Today? Alcohol or Drug Use Treatment   Have You Recently Had Any Thoughts About Hurting Yourself? No  Are You Planning to Commit Suicide/Harm Yourself At This time? No   Flowsheet Row ED from 03/28/2023 in [facility] ED from 03/01/2023 in [facility] Emergency Department at [facility] ED  from 02/26/2023 in [facility] Emergency Department at [facility]  C-SSRS RISK CATEGORY No Risk No Risk No Risk       Have you Recently Had Thoughts About Hurting Someone [name]? No  Are You Planning to Harm Someone at This Time? No  Explanation: n/a   Have You Used Any Alcohol or Drugs in the Past 24 Hours? Yes  What Did You Use and How Much? 4 locos   Do You Currently Have a Therapist/Psychiatrist? No  Name of Therapist/Psychiatrist: Name of Therapist/Psychiatrist: n/a   Have You Been Recently  Discharged From Any Office Practice or Programs? No  Explanation of Discharge From Practice/Program: n/a     CCA Screening Triage Referral Assessment Type of Contact: Face-to-Face  Telemedicine Service Delivery:   Is this Initial or Reassessment?   Date Telepsych consult ordered in [facility]:    Time Telepsych consult ordered in [facility]:    Location of Assessment: [facility] Assessment Services  Provider Location: [facility] Assessment Services   Collateral Involvement: [name], son, [phone]   Does Patient Have a Court Appointed Legal Guardian? No  Legal Guardian Contact Information: n/a  Copy of Legal Guardianship Form: -- (n/a)  Legal Guardian Notified of Arrival: -- (n/a)  Legal Guardian Notified of Pending Discharge: -- (n/a)  If Minor and Not Living with Parent(s), Who has Custody? n/a  Is CPS involved or ever been involved? Never  Is APS involved or ever been involved? Never   Patient Determined To Be At Risk for Harm To Self or Others Based on Review of Patient Reported Information or Presenting Complaint? No  Method: No Plan  Availability of Means: No access or NA  Intent: Vague intent or NA  Notification Required: No need or identified person  Additional Information for Danger to Others Potential: -- (n/a)  Additional Comments for Danger to Others Potential: n/a  Are There Guns or Other Weapons in Your Home? No  Types of Guns/Weapons: n/a  Are These Weapons Safely Secured?                            -- (n/a)  Who Could Verify You Are Able To Have These Secured: n/a  Do You Have any Outstanding Charges, Pending Court Dates, Parole/Probation? none reported  Contacted To Inform of Risk of Harm To Self or Others: Patent examiner; Family/Significant Other:    Does Patient Present under Involuntary Commitment? Yes    [place] of Residence: [place]   Patient Currently Receiving the Following Services: Not Receiving Services   Determination of Need:  Urgent (48 hours)   Options For Referral: Facility-Based Crisis; BH Urgent Care     CCA Biopsychosocial Patient Reported Schizophrenia/Schizoaffective Diagnosis in Past: No   Strengths: Self awareness   Mental Health Symptoms Depression:   None   Duration of Depressive symptoms:    Mania:   None   Anxiety:    None   Psychosis:   None   Duration of Psychotic symptoms:    Trauma:   None   Obsessions:   None   Compulsions:   None   Inattention:   None   Hyperactivity/Impulsivity:   None   Oppositional/Defiant Behaviors:   None   Emotional Irregularity:   None   Other Mood/Personality Symptoms:   n/a    Mental Status Exam Appearance and self-care  Stature:   Average   Weight:   Average weight   Clothing:   Age-appropriate  Grooming:   Normal   Cosmetic use:   None   Posture/gait:   Normal   Motor activity:   Not Remarkable   Sensorium  Attention:   Normal   Concentration:   Normal   Orientation:   X5   Recall/memory:   Normal   Affect and Mood  Affect:   Anxious   Mood:   Anxious   Relating  Eye contact:   Normal   Facial expression:   Anxious   Attitude toward examiner:   Cooperative   Thought and Language  Speech flow:  Normal   Thought content:   Appropriate to Mood and Circumstances   Preoccupation:   None   Hallucinations:   None   Organization:   Coherent   [employer] of Knowledge:   Average   Intelligence:   Average   Abstraction:   Normal   Judgement:   Impaired   Reality Testing:   Adequate   Insight:   Lacking   Decision Making:   Impulsive   Social Functioning  Social Maturity:   Isolates   Social Judgement:   Naive   Stress  Stressors:   Family conflict   Coping Ability:   Human resources officer Deficits:   Self-control   Supports:   Family     Religion: Religion/Spirituality Are You A Religious Person?: Yes What is Your  Religious Affiliation?: Christian How Might This Affect Treatment?: none  Leisure/Recreation: Leisure / Recreation Do You Have Hobbies?: Yes Leisure and Hobbies: puzzles  Exercise/Diet: Exercise/Diet Do You Exercise?: No Have You Gained or Lost A Significant Amount of Weight in the Past Six Months?: No Do You Follow a Special Diet?: No Do You Have Any Trouble Sleeping?: Yes Explanation of Sleeping Difficulties: poor due to hand pain   CCA Employment/Education Employment/Work Situation: Employment / Work Situation Employment Situation: Unemployed Patient's Job has Been Impacted by Current Illness:  (n/a) Has Patient ever Been in Equities trader?: No  Education: Education Is Patient Currently Attending School?: No Last Grade Completed: 12 Did You Product manager?:  (unable to assess) Did You Have An Individualized Education Program (IIEP): No Did You Have Any Difficulty At School?: No Patient's Education Has Been Impacted by Current Illness: No   CCA Family/Childhood History Family and Relationship History: Family history Marital status: Single Does patient have children?: Yes How many children?: 4 How is patient's relationship with their children?: good  Childhood History:  Childhood History By whom was/is the patient raised?: Mother Did patient suffer any verbal/emotional/physical/sexual abuse as a child?: No Did patient suffer from severe childhood neglect?: No Has patient ever been sexually abused/assaulted/raped as an adolescent or adult?: No Was the patient ever a victim of a crime or a disaster?: No Witnessed domestic violence?: No Has patient been affected by domestic violence as an adult?: No       CCA Substance Use Alcohol/Drug Use: Alcohol / Drug Use Pain Medications: see MAR Prescriptions: see MAR Over the Counter: see MAR History of alcohol / drug use?: Yes Longest period of sobriety (when/how long): n/a Negative Consequences of Use: Personal  relationships Withdrawal Symptoms: None Substance #1 Name of Substance 1: alcohol 1 - Age of First Use: unknown 1 - Amount (size/oz): "4 locos and mini wine bottles" 1 - Frequency: daily 1 - Last Use / Amount: today                       ASAM's:  Six Dimensions of Multidimensional Assessment  Dimension 1:  Acute Intoxication and/or Withdrawal Potential:   Dimension 1:  Description of individual's past and current experiences of substance use and withdrawal: Ongoing alcohol  Dimension 2:  Biomedical Conditions and Complications:   Dimension 2:  Description of patient's biomedical conditions and  complications: Body pain, patient currently in Remission from Stage 4 cancer  Dimension 3:  Emotional, Behavioral, or Cognitive Conditions and Complications:  Dimension 3:  Description of emotional, behavioral, or cognitive conditions and complications: Worsening depression.  Dimension 4:  Readiness to Change:  Dimension 4:  Description of Readiness to Change criteria: Patient does not recognize problem.  Dimension 5:  Relapse, Continued use, or Continued Problem Potential:  Dimension 5:  Relapse, continued use, or continued problem potential critiera description: Continued usage  Dimension 6:  Recovery/Living Environment:  Dimension 6:  Recovery/Iiving environment criteria description: Supportive home environment  ASAM Severity Score: ASAM's Severity Rating Score: 7  ASAM Recommended Level of Treatment: ASAM Recommended Level of Treatment: Level III Residential Treatment   Substance use Disorder (SUD) Substance Use Disorder (SUD)  Checklist Symptoms of Substance Use: Continued use despite persistent or recurrent social, interpersonal problems, caused or exacerbated by use, Continued use despite having a persistent/recurrent physical/psychological problem caused/exacerbated by use, Substance(s) often taken in larger amounts or over longer times than was intended, Social, occupational,  recreational activities given up or reduced due to use  Recommendations for Services/Supports/Treatments: Recommendations for Services/Supports/Treatments Recommendations For Services/Supports/Treatments: Detox, Individual Therapy, Facility Based Crisis  Discharge Disposition: Discharge Disposition Medical Exam completed: Yes  DSM5 Diagnoses: Patient Active Problem List   Diagnosis Date Noted   Alcohol withdrawal (HCC) 04/13/2021   Alcohol use disorder, severe, dependence (HCC) 04/12/2021     Referrals to Alternative Service(s): Referred to Alternative Service(s):   Place:   Date:   Time:    Referred to Alternative Service(s):   Place:   Date:   Time:    Referred to Alternative Service(s):   Place:   Date:   Time:    Referred to Alternative Service(s):   Place:   Date:   Time:     Burnetta Sabin, [facility]

## 2023-03-29 NOTE — Discharge Instructions (Signed)
Transfer to [facility]

## 2023-03-29 NOTE — ED Notes (Signed)
Pt sleeping at present, no distress noted.  Monitoring for safety.

## 2023-03-29 NOTE — Group Note (Signed)
Group Topic: Positive Affirmations  Group Date: 03/29/2023 Start Time: 1000 End Time: 1020 Facilitators: Priscille Kluver, NT  Department: [facility]  Number of Participants: 4  Group Focus: affirmation Treatment Modality:  Solution-Focused Therapy Interventions utilized were support Purpose: increase insight  Name: [name] Date of Birth: [birth year]  MR: [id number]    Level of Participation: Pt did not attend group. Arrived on the unit during group.  Patients Problems:  Patient Active Problem List   Diagnosis Date Noted   Alcohol withdrawal (HCC) 04/13/2021   Alcohol use disorder, severe, dependence (HCC) 04/12/2021

## 2023-03-29 NOTE — ED Notes (Signed)
Pt A&O x 4, IVCed by son.  Son reports pt made suicidal statements and drinks alcohol in excess. Pt denies SI.  Comfort measures given.  Monitoring for safety.

## 2023-03-30 DIAGNOSIS — R110 Nausea: Secondary | ICD-10-CM | POA: Diagnosis not present

## 2023-03-30 DIAGNOSIS — F419 Anxiety disorder, unspecified: Secondary | ICD-10-CM | POA: Insufficient documentation

## 2023-03-30 DIAGNOSIS — F1010 Alcohol abuse, uncomplicated: Secondary | ICD-10-CM | POA: Insufficient documentation

## 2023-03-30 MED ORDER — CLONIDINE HCL 0.1 MG PO TABS
0.1000 mg | ORAL_TABLET | Freq: Two times a day (BID) | ORAL | Status: AC
  Administered 2023-03-30 – 2023-03-31 (×4): 0.1 mg via ORAL
  Filled 2023-03-30 (×4): qty 1

## 2023-03-30 MED ORDER — NALTREXONE HCL 50 MG PO TABS
50.0000 mg | ORAL_TABLET | Freq: Every day | ORAL | Status: DC
  Administered 2023-03-31 – 2023-04-01 (×2): 50 mg via ORAL
  Filled 2023-03-30 (×2): qty 1

## 2023-03-30 NOTE — ED Notes (Signed)
Pt in dayroom working puzzle and talking with peers. Denies SI/HI/AVH. Denies s/s of withdrawal. She is pleasant, engaged. Contracts for safety. No noted distress. Will continue to monitor for safety

## 2023-03-30 NOTE — Group Note (Signed)
Group Topic: Relaxation  Group Date: 03/30/2023 Start Time: 1640 End Time: 1700 Facilitators: Dickie La, RN  Department: [facility]  Number of Participants: 4  Group Focus: relaxation Treatment Modality:  Leisure Development Interventions utilized were clarification and support Purpose: increase insight  Name: [name] Date of Birth: 1975/10/18  MR: [id number]    Level of Participation: active Quality of Participation: attentive, cooperative, and engaged Interactions with others: gave feedback Mood/Affect: appropriate Triggers (if applicable): none Cognition: coherent/clear and logical Progress: Gaining insight Response: Pt went to the courtyard and participated in relaxation group. Pt stated that she like to walk and do puzzles for relaxation. Plan: follow-up needed  Patients Problems:  Patient Active Problem List   Diagnosis Date Noted   Alcohol dependence (HCC) 03/29/2023   Alcohol withdrawal (HCC) 04/13/2021   Alcohol use disorder, severe, dependence (HCC) 04/12/2021

## 2023-03-30 NOTE — Progress Notes (Signed)
Pt is awake, alert and oriented X3. Pt complained of pain on her fingers and anxiety. No signs of acute distress noted. PRN Acetaminophen, Hydroxyzine and scheduled meds administered per order. Pt denies current SI/HI/AVH, plan or intent. Staff will monitor for pt's safety.

## 2023-03-30 NOTE — Group Note (Signed)
Group Topic: Change and Accountability  Group Date: 03/30/2023 Start Time: 0745 End Time: 0820 Facilitators: Debe Coder, NT  Department: [facility]  Number of Participants: 3  Group Focus: goals/reality orientation Treatment Modality:  Individual Therapy Interventions utilized were group exercise Purpose: enhance coping skills  Name: HAMPTON SCHEFFLER Date of Birth: [birth year]  MR: [id number]    Level of Participation: active Quality of Participation: engaged Interactions with others: gave feedback Mood/Affect: appropriate Triggers (if applicable):  Cognition: coherent/clear Progress: Significant Response:  Plan: referral / recommendations  Patients Problems:  Patient Active Problem List   Diagnosis Date Noted   Alcohol dependence (HCC) 03/29/2023   Alcohol withdrawal (HCC) 04/13/2021   Alcohol use disorder, severe, dependence (HCC) 04/12/2021

## 2023-03-30 NOTE — Progress Notes (Signed)
Pt reported pain 7/10 in hands. PRN pain meds given. No further complaints at this time.

## 2023-03-30 NOTE — Discharge Planning (Signed)
Intensive outpatient services have been arranged for the patient with [facility]. Intake appt has been scheduled for Monday 04/02/23 at 8:00am. Patient to be informed of plan once group is complete with Chaplain. No other needs to report at this time.    Fernande Boyden, LCSW Clinical Social Worker [facility] Ph: [phone]

## 2023-03-30 NOTE — Tx Team (Signed)
LCSW, MD, and Resident met with patient to assess current mood, affect, physical state, and inquire about needs/goals while here in [facility] and after discharge. Patient presents under IVC due to intoxication and threats to harm herself. Patient reports she presented due to needing to detox from alcohol. Patient reports she has attempted to detox on her own before, however reports experiencing adverse withdrawals and elevated BP so she came to the [facility] for help. Patient reports she lives in [place] in a family home. Patient reports having support from her children, parents, and partner. Patient reports her partner is a huge help with ensuring she does not over indulge in alcohol and typically monitors her due to her past use. Patient denies ever receiving residential treatment before, other than detox. Patient reports she is consistent with her PCP follow up at [facility]. Patient reports a history of anxiety and reports that being a contributing factor to her alcohol use. Patient denies any legal charges or upcoming court dates. Patient was informed of treatment teams current recommendation for residential placement. Patient declined stating she does not believe she needs that at this time, and also reports she is not able to be away for 28-30 days with a 46 year old in the house. Patient reports her goal would be to follow up outpatient for alcohol use and reports an interest in exploring family therapy so that her and her children are able to talk through stressors. Patient aware that referral will be sent for CDIOP services and she will be provided an update once received. No other needs were reported by the patient. Patient has interacted well with staff and peers and has participated in programming on unit.   LCSW sent secure chat to Mrs. Rudd regarding CDIOP referral. LCSW will provide updates as received.    Michelle Boyden, LCSW Clinical Social Worker [facility] Ph: [phone]

## 2023-03-30 NOTE — ED Provider Notes (Addendum)
Behavioral Health Progress Note  Date and Time: 03/30/2023 11:15 AM Name: [name] MRN:  [id number]  Subjective:  [name] is a 47 year old female with alcohol use disorder who presents involuntary (IVC from her son) to the [facility] for alcohol detox.   Patient reports past day she has felt "very good". States she was "a little nervous and afraid" when she arrived, but "the staff has been very supportive and helpful with my anxiety". Endorses no current depression, anxiety, SI, HI, or AVH. Patient reports she slept well last night and has had a good appetite but with mild nausea last night after eating.  Patient states she would like "something to reduce my cravings that I can take daily outpatient". Discussed starting naltrexone, to which patient was amenable. Patient states she feels like "this time is different", she is more ready to quit drinking. Patient's goals for today include finishing paperwork and staying busy with puzzles and reading.  Diagnosis:  Final diagnoses:  Alcohol use disorder    Total Time spent with patient: 30 minutes  Past Psychiatric History: AUD (tried Zoloft in the past and had fair repsonse. Did not want to restart at this time)   Is the patient at risk to self? Yes  Has the patient been a risk to self in the past 6 months? No .    Has the patient been a risk to self within the distant past? No   Is the patient a risk to others? No   Has the patient been a risk to others in the past 6 months? No   Has the patient been a risk to others within the distant past? No    Past Medical History: Rheumatological condition with hands Family History: Per patient her father has a history of PTSD  Social History: Lives in [place] with her father, 2 sons (ages 82 and 60) Unemployed Partner of 3 years is her support system Denies legal history  Additional Social History:                         Sleep: Good  Appetite:  Good  Current Medications:   Current Facility-Administered Medications  Medication Dose Route Frequency Provider Last Rate Last Admin   acetaminophen (TYLENOL) tablet 650 mg  650 mg Oral Q6H PRN White, Patrice L, NP   650 mg at 03/30/23 0838   alum & mag hydroxide-simeth (MAALOX/MYLANTA) 200-200-20 MG/5ML suspension 30 mL  30 mL Oral Q4H PRN White, Patrice L, NP       chlordiazePOXIDE (LIBRIUM) capsule 25 mg  25 mg Oral Q6H PRN White, Patrice L, NP       chlordiazePOXIDE (LIBRIUM) capsule 25 mg  25 mg Oral TID White, Patrice L, NP       Followed by   Melene Muller ON 03/31/2023] chlordiazePOXIDE (LIBRIUM) capsule 25 mg  25 mg Oral BH-qamhs White, Patrice L, NP       Followed by   Melene Muller ON 04/01/2023] chlordiazePOXIDE (LIBRIUM) capsule 25 mg  25 mg Oral Daily White, Patrice L, NP       cloNIDine (CATAPRES) tablet 0.1 mg  0.1 mg Oral BID Kizzie Ide B, MD   0.1 mg at 03/30/23 0836   hydrOXYzine (ATARAX) tablet 25 mg  25 mg Oral TID PRN White, Patrice L, NP   25 mg at 03/30/23 0837   loperamide (IMODIUM) capsule 2-4 mg  2-4 mg Oral PRN Layla Barter, NP  magnesium hydroxide (MILK OF MAGNESIA) suspension 30 mL  30 mL Oral Daily PRN White, Patrice L, NP       multivitamin with minerals tablet 1 tablet  1 tablet Oral Daily White, Patrice L, NP   1 tablet at 03/30/23 0836   ondansetron (ZOFRAN-ODT) disintegrating tablet 4 mg  4 mg Oral Q6H PRN White, Patrice L, NP   4 mg at 03/29/23 2119   thiamine (VITAMIN B1) tablet 100 mg  100 mg Oral Daily White, Patrice L, NP   100 mg at 03/30/23 0836   traZODone (DESYREL) tablet 50 mg  50 mg Oral QHS PRN White, Patrice L, NP   50 mg at 03/29/23 2117   Current Outpatient Medications  Medication Sig Dispense Refill   busPIRone (BUSPAR) 15 MG tablet Take 1 tablet (15 mg total) by mouth See admin instructions. Take one tablet by mouth three times daily and one additional tablet if needed      Labs  Lab Results:  Admission on 03/28/2023, Discharged on 03/29/2023  Component Date Value  Ref Range Status   WBC 03/28/2023 3.9 (L)  4.0 - 10.5 K/uL Final   RBC 03/28/2023 3.97  3.87 - 5.11 MIL/uL Final   Hemoglobin 03/28/2023 12.1  12.0 - 15.0 g/dL Final   HCT 08/65/7846 36.3  36.0 - 46.0 % Final   MCV 03/28/2023 91.4  80.0 - 100.0 fL Final   MCH 03/28/2023 30.5  26.0 - 34.0 pg Final   MCHC 03/28/2023 33.3  30.0 - 36.0 g/dL Final   RDW 96/29/5284 14.1  11.5 - 15.5 % Final   Platelets 03/28/2023 181  150 - 400 K/uL Final   nRBC 03/28/2023 0.0  0.0 - 0.2 % Final   Neutrophils Relative % 03/28/2023 47  % Final   Neutro Abs 03/28/2023 1.8  1.7 - 7.7 K/uL Final   Lymphocytes Relative 03/28/2023 39  % Final   Lymphs Abs 03/28/2023 1.5  0.7 - 4.0 K/uL Final   Monocytes Relative 03/28/2023 10  % Final   Monocytes Absolute 03/28/2023 0.4  0.1 - 1.0 K/uL Final   Eosinophils Relative 03/28/2023 3  % Final   Eosinophils Absolute 03/28/2023 0.1  0.0 - 0.5 K/uL Final   Basophils Relative 03/28/2023 1  % Final   Basophils Absolute 03/28/2023 0.1  0.0 - 0.1 K/uL Final   Immature Granulocytes 03/28/2023 0  % Final   Abs Immature Granulocytes 03/28/2023 0.01  0.00 - 0.07 K/uL Final   Performed at [facility], [address], [place], [place] [address]   Sodium 03/28/2023 143  135 - 145 mmol/L Final   Potassium 03/28/2023 3.2 (L)  3.5 - 5.1 mmol/L Final   Chloride 03/28/2023 101  98 - 111 mmol/L Final   CO2 03/28/2023 25  22 - 32 mmol/L Final   Glucose, Bld 03/28/2023 102 (H)  70 - 99 mg/dL Final   Glucose reference range applies only to samples taken after fasting for at least 8 hours.   BUN 03/28/2023 12  6 - 20 mg/dL Final   Creatinine, Ser 03/28/2023 0.69  0.44 - 1.00 mg/dL Final   Calcium 07/12/7251 9.1  8.9 - 10.3 mg/dL Final   Total Protein 66/44/0347 7.2  6.5 - 8.1 g/dL Final   Albumin 42/59/5638 4.1  3.5 - 5.0 g/dL Final   AST 75/64/3329 40  15 - 41 U/L Final   ALT 03/28/2023 15  0 - 44 U/L Final   Alkaline Phosphatase 03/28/2023 59  38 - 126 U/L Final   Total  Bilirubin 03/28/2023 1.0  0.3 - 1.2 mg/dL Final   GFR, Estimated 03/28/2023 >60  >60 mL/min Final   Comment: (NOTE) Calculated using the CKD-EPI Creatinine Equation (2021)    Anion gap 03/28/2023 17 (H)  5 - 15 Final   Performed at [facility], [address], [place], [place] [address]   Alcohol, Ethyl (B) 03/28/2023 281 (H)  <10 mg/dL Final   Comment: (NOTE) Lowest detectable limit for serum alcohol is 10 mg/dL.  For medical purposes only. Performed at [facility], [address] [place], [place] [address]    TSH 03/28/2023 0.857  0.350 - 4.500 uIU/mL Final   Comment: Performed by a 3rd Generation assay with a functional sensitivity of <=0.01 uIU/mL. Performed at [facility], [address], [place], [place] [address]    Preg Test, Ur 03/28/2023 Negative  Negative Final   POC Amphetamine UR 03/28/2023 None Detected  NONE DETECTED (Cut Off Level 1000 ng/mL) Final   POC Secobarbital (BAR) 03/28/2023 None Detected  NONE DETECTED (Cut Off Level 300 ng/mL) Final   POC Buprenorphine (BUP) 03/28/2023 None Detected  NONE DETECTED (Cut Off Level 10 ng/mL) Final   POC Oxazepam (BZO) 03/28/2023 None Detected  NONE DETECTED (Cut Off Level 300 ng/mL) Final   POC Cocaine UR 03/28/2023 None Detected  NONE DETECTED (Cut Off Level 300 ng/mL) Final   POC Methamphetamine UR 03/28/2023 None Detected  NONE DETECTED (Cut Off Level 1000 ng/mL) Final   POC Morphine 03/28/2023 None Detected  NONE DETECTED (Cut Off Level 300 ng/mL) Final   POC Methadone UR 03/28/2023 None Detected  NONE DETECTED (Cut Off Level 300 ng/mL) Final   POC Oxycodone UR 03/28/2023 None Detected  NONE DETECTED (Cut Off Level 100 ng/mL) Final   POC Marijuana UR 03/28/2023 Positive (A)  NONE DETECTED (Cut Off Level 50 ng/mL) Final  Admission on 03/01/2023, Discharged on 03/01/2023  Component Date Value Ref Range Status   SARS Coronavirus 2 by RT PCR 03/01/2023 NEGATIVE  NEGATIVE Final   Performed at [facility], [address] [place], [place] [address]   Sodium 03/01/2023 137  135 - 145 mmol/L Final   Potassium 03/01/2023 3.6  3.5 - 5.1 mmol/L Final   Chloride 03/01/2023 97 (L)  98 - 111 mmol/L Final   CO2 03/01/2023 23  22 - 32 mmol/L Final   Glucose, Bld 03/01/2023 100 (H)  70 - 99 mg/dL Final   Glucose reference range applies only to samples taken after fasting for at least 8 hours.   BUN 03/01/2023 10  6 - 20 mg/dL Final   Creatinine, Ser 03/01/2023 0.80  0.44 - 1.00 mg/dL Final   Calcium 29/56/2130 9.4  8.9 - 10.3 mg/dL Final   Total Protein 86/57/8469 7.8  6.5 - 8.1 g/dL Final   Albumin 62/95/2841 4.4  3.5 - 5.0 g/dL Final   AST 32/44/0102 61 (H)  15 - 41 U/L Final   ALT 03/01/2023 27  0 - 44 U/L Final   Alkaline Phosphatase 03/01/2023 62  38 - 126 U/L Final   Total Bilirubin 03/01/2023 2.6 (H)  0.3 - 1.2 mg/dL Final   GFR, Estimated 03/01/2023 >60  >60 mL/min Final   Comment: (NOTE) Calculated using the CKD-EPI Creatinine Equation (2021)    Anion gap 03/01/2023 17 (H)  5 - 15 Final   Performed at [facility], [address]  [place], [place] [address]   WBC 03/01/2023 4.0  4.0 - 10.5 K/uL Final   RBC 03/01/2023 3.90  3.87 - 5.11 MIL/uL Final   Hemoglobin 03/01/2023 11.8 (L)  12.0 - 15.0 g/dL Final   HCT 19/14/7829 35.3 (L)  36.0 - 46.0 % Final   MCV 03/01/2023 90.5  80.0 - 100.0 fL Final   MCH 03/01/2023 30.3  26.0 - 34.0 pg Final   MCHC 03/01/2023 33.4  30.0 - 36.0 g/dL Final   RDW 56/21/3086 14.6  11.5 - 15.5 % Final   Platelets 03/01/2023 171  150 - 400 K/uL Final   nRBC 03/01/2023 0.0  0.0 - 0.2 % Final   Neutrophils Relative % 03/01/2023 63  % Final   Neutro Abs 03/01/2023 2.5  1.7 - 7.7 K/uL Final   Lymphocytes Relative 03/01/2023 26  % Final   Lymphs Abs 03/01/2023 1.0  0.7 - 4.0 K/uL Final   Monocytes Relative 03/01/2023 9  % Final   Monocytes Absolute 03/01/2023 0.4  0.1 - 1.0 K/uL Final   Eosinophils Relative 03/01/2023 1  % Final   Eosinophils  Absolute 03/01/2023 0.0  0.0 - 0.5 K/uL Final   Basophils Relative 03/01/2023 1  % Final   Basophils Absolute 03/01/2023 0.1  0.0 - 0.1 K/uL Final   Immature Granulocytes 03/01/2023 0  % Final   Abs Immature Granulocytes 03/01/2023 0.01  0.00 - 0.07 K/uL Final   Performed at [facility], [address], [place], [place] [address]   Troponin I (High Sensitivity) 03/01/2023 4  <18 ng/L Final   Comment: (NOTE) Elevated high sensitivity troponin I (hsTnI) values and significant  changes across serial measurements may suggest ACS but many other  chronic and acute conditions are known to elevate hsTnI results.  Refer to the "Links" section for chest pain algorithms and additional  guidance. Performed at [facility], [address] [place], [place] [address]    Troponin I (High Sensitivity) 03/01/2023 6  <18 ng/L Final   Comment: (NOTE) Elevated high sensitivity troponin I (hsTnI) values and significant  changes across serial measurements may suggest ACS but many other  chronic and acute conditions are known to elevate hsTnI results.  Refer to the "Links" section for chest pain algorithms and additional  guidance. Performed at [facility], [address], [place], [place] [address]   Admission on 02/26/2023, Discharged on 02/26/2023  Component Date Value Ref Range Status   SARS Coronavirus 2 by RT PCR 02/26/2023 NEGATIVE  NEGATIVE Final   Performed at [facility], [address] [place], [place] [address]   Sodium 02/26/2023 141  135 - 145 mmol/L Final   Potassium 02/26/2023 3.5  3.5 - 5.1 mmol/L Final   Chloride 02/26/2023 99  98 - 111 mmol/L Final   CO2 02/26/2023 23  22 - 32 mmol/L Final   Glucose, Bld 02/26/2023 144 (H)  70 - 99 mg/dL Final   Glucose reference range applies only to samples taken after fasting for at least 8 hours.   BUN 02/26/2023 10  6 - 20 mg/dL Final   Creatinine, Ser 02/26/2023 0.95  0.44 - 1.00 mg/dL Final   Calcium 02/72/5366 9.5   8.9 - 10.3 mg/dL Final   GFR, Estimated 02/26/2023 >60  >60 mL/min Final   Comment: (NOTE) Calculated using the CKD-EPI Creatinine Equation (2021)    Anion gap 02/26/2023 19 (H)  5 - 15 Final   Performed at [facility], [address] [place], [place] [address]   WBC 02/26/2023 4.8  4.0 - 10.5 K/uL Final   RBC 02/26/2023 4.33  3.87 - 5.11 MIL/uL Final   Hemoglobin 02/26/2023 13.1  12.0 - 15.0 g/dL Final   HCT 60/45/4098 39.0  36.0 - 46.0 % Final   MCV 02/26/2023 90.1  80.0 - 100.0 fL Final   MCH 02/26/2023 30.3  26.0 - 34.0 pg Final   MCHC 02/26/2023 33.6  30.0 - 36.0 g/dL Final   RDW 11/91/4782 14.8  11.5 - 15.5 % Final   Platelets 02/26/2023 282  150 - 400 K/uL Final   nRBC 02/26/2023 0.0  0.0 - 0.2 % Final   Performed at [facility], [address] [place], [place] [address]   Troponin I (High Sensitivity) 02/26/2023 4  <18 ng/L Final   Comment: (NOTE) Elevated high sensitivity troponin I (hsTnI) values and significant  changes across serial measurements may suggest ACS but many other  chronic and acute conditions are known to elevate hsTnI results.  Refer to the "Links" section for chest pain algorithms and additional  guidance. Performed at [facility], [address] [place], [place] [address]    Preg, Serum 02/26/2023 NEGATIVE  NEGATIVE Final   Comment:        THE SENSITIVITY OF THIS METHODOLOGY IS >10 mIU/mL. Performed at [facility], [address] [place], [place] [address]    Alcohol, Ethyl (B) 02/26/2023 233 (H)  <10 mg/dL Final   Comment: (NOTE) Lowest detectable limit for serum alcohol is 10 mg/dL.  For medical purposes only. Performed at [facility], [address], [place], [place] [address]    Troponin I (High Sensitivity) 02/26/2023 4  <18 ng/L Final   Comment: (NOTE) Elevated high sensitivity troponin I (hsTnI) values and significant  changes across serial measurements may suggest ACS but many other  chronic and  acute conditions are known to elevate hsTnI results.  Refer to the "Links" section for chest pain algorithms and additional  guidance. Performed at [facility], [address], [place], [place] [address]    Total Protein 02/26/2023 8.2 (H)  6.5 - 8.1 g/dL Final   Albumin 44/07/270 4.3  3.5 - 5.0 g/dL Final   AST 53/66/4403 69 (H)  15 - 41 U/L Final   ALT 02/26/2023 30  0 - 44 U/L Final   Alkaline Phosphatase 02/26/2023 61  38 - 126 U/L Final   Total Bilirubin 02/26/2023 1.0  0.3 - 1.2 mg/dL Final   Bilirubin, Direct 02/26/2023 0.1  0.0 - 0.2 mg/dL Final   Indirect Bilirubin 02/26/2023 0.9  0.3 - 0.9 mg/dL Final   Performed at [facility], [address], [place], [place] [address]   Lipase 02/26/2023 33  11 - 51 U/L Final   Performed at [facility], [address] [place], [place] [address]    Blood Alcohol level:  Lab Results  Component Value Date   ETH 281 (H) 03/28/2023   ETH 233 (H) 02/26/2023    Metabolic Disorder Labs: Lab Results  Component Value Date   HGBA1C 5.2 04/12/2021   MPG 102.54 04/12/2021   No results found for: "PROLACTIN" Lab Results  Component Value Date   CHOL 207 (H) 04/12/2021   TRIG 51 04/12/2021   HDL 120 04/12/2021   CHOLHDL 1.7 04/12/2021   VLDL 10 04/12/2021   LDLCALC 77 04/12/2021  Therapeutic Lab Levels: No results found for: "LITHIUM" No results found for: "VALPROATE" No results found for: "CBMZ"  Physical Findings   PHQ2-9    Flowsheet Row ED from 03/29/2023 in [facility]  PHQ-2 Total Score 0      Flowsheet Row ED from 03/29/2023 in [facility] from 03/28/2023 in [facility] ED from 03/01/2023 in [facility] at [facility]  C-SSRS RISK CATEGORY No Risk No Risk No Risk        Musculoskeletal  Strength & Muscle Tone: within normal limits Gait & Station: normal Patient leans:  N/A  Psychiatric Specialty Exam  Presentation  General Appearance:  Casual; Appropriate for Environment  Eye Contact: Good  Speech: Clear and Coherent  Speech Volume: Normal  Handedness: Right   Mood and Affect  Mood: Euthymic  Affect: Congruent   Thought Process  Thought Processes: Coherent  Descriptions of Associations:Intact  Orientation:Full (Time, Place and Person)  Thought Content:Logical; WDL  Diagnosis of Schizophrenia or Schizoaffective disorder in past: No    Hallucinations:Hallucinations: None  Ideas of Reference:None  Suicidal Thoughts:Suicidal Thoughts: No  Homicidal Thoughts:Homicidal Thoughts: No   Sensorium  Memory: Remote Good  Judgment: Impaired  Insight: Lacking   Executive Functions  Concentration: Good  Attention Span: Good  Recall: Good  Fund of Knowledge: Good  Language: Good   Psychomotor Activity  Psychomotor Activity: Psychomotor Activity: Normal   Assets  Assets: Communication Skills; Resilience; Desire for Improvement; Leisure Time; Physical Health   Sleep  Sleep: Sleep: Good   Nutritional Assessment (For OBS and FBC admissions only) Has the patient had a weight loss or gain of 10 pounds or more in the last 3 months?: No Has the patient had a decrease in food intake/or appetite?: No Does the patient have dental problems?: No Does the patient have eating habits or behaviors that may be indicators of an eating disorder including binging or inducing vomiting?: No Has the patient recently lost weight without trying?: 0 Has the patient been eating poorly because of a decreased appetite?: 0 Malnutrition Screening Tool Score: 0    Physical Exam  Physical Exam Constitutional:      General: She is not in acute distress.    Appearance: Normal appearance.  HENT:     Head: Normocephalic and atraumatic.     Nose: Nose normal.     Mouth/Throat:     Mouth: Mucous membranes are moist.      Pharynx: Oropharynx is clear.  Eyes:     Extraocular Movements: Extraocular movements intact.     Conjunctiva/sclera: Conjunctivae normal.     Pupils: Pupils are equal, round, and reactive to light.  Cardiovascular:     Rate and Rhythm: Normal rate.  Pulmonary:     Effort: Pulmonary effort is normal.  Abdominal:     General: Abdomen is flat.     Palpations: Abdomen is soft.  Musculoskeletal:        General: Normal range of motion.     Cervical back: Normal range of motion and neck supple.  Skin:    General: Skin is warm and dry.  Neurological:     General: No focal deficit present.     Mental Status: She is alert and oriented to person, place, and time.  Psychiatric:        Mood and Affect: Mood normal.    Review of Systems  Constitutional: Negative.   HENT: Negative.  Eyes: Negative.   Respiratory: Negative.    Cardiovascular: Negative.   Gastrointestinal: Negative.   Genitourinary: Negative.   Musculoskeletal: Negative.   Skin: Negative.   Neurological: Negative.   Psychiatric/Behavioral:  Positive for substance abuse. Negative for depression, hallucinations, memory loss and suicidal ideas. The patient is not nervous/anxious and does not have insomnia.    Blood pressure (!) 144/100, pulse 71, temperature 98.2 F (36.8 C), temperature source Oral, resp. rate 18, SpO2 100%. There is no height or weight on file to calculate BMI.  Treatment Plan Summary:  Alcohol Use Disorder Alcohol Withdrawal EtOH 281 9/19. LFTs WNL.  -Continue librium taper (started 9/19) -Clonidine 0.1 mg scheduled bid for elevated BP- last day on 9/21, renew if BP continues to be elevated -Naltrexone 50mg  daily starting 9/20 to reduce cravings -CIWA with Ativan as needed for CIWA greater than 10 -Thiamine 100 mg IM first day and PO after that -Multivitamin with minerals daily -Tylenol 650 mg every 6 hours as needed for pain -Zofran 4 mg every 6 hours as needed for nausea or vomiting -Imodium  2 to 4 mg as needed for diarrhea or loose stools  -Maalox/Mylanta 30 mL every 4 hours as needed for indigestion -Milk of Mag 30 mL as needed for constipation  -Hydroxyzine 25 mg q6h PRN for anxiety -Trazodone 50 mg qhs PRN for sleep   Hypokalemia K is 3.2. Treated 40 meq KCl   Dispo: plan to discharge Monday with referral for CDIOP  -Get collateral today from family for AUD safety planning  Daily contact with patient to assess and evaluate symptoms and progress in treatment and Medication management  Hanley Ben, Medical Student 03/30/2023 11:15 AM  I personally was present and performed or re-performed the history and medical decision-making activities of this service and have verified that the service and findings are accurately documented in the student's note.  Kizzie Ide, MD, PGY-2

## 2023-03-30 NOTE — Progress Notes (Signed)
SPIRITUALITY GROUP NOTE  Spirituality group facilitated by Wilkie Aye, MDiv, BCC.  Group Description: Group focused on topic of hope. Patients participated in facilitated discussion around topic, connecting with one another around experiences and definitions for hope. Group members engaged with visual explorer photos, reflecting on what hope looks like for them today. Group engaged in discussion around how their definitions of hope are present today in hospital.  Modalities: Psycho-social ed, Adlerian, Narrative, MI  Patient Progress: Present throughout.  Actively engaged in group discussion.  Oriented to topic with appropriate responses

## 2023-03-30 NOTE — ED Notes (Signed)
Pt observed/assessed in room sleeping. RR even and unlabored, appearing in no noted distress. Environmental check complete, will continue to monitor for safety 

## 2023-03-30 NOTE — Discharge Planning (Signed)
LCSW contacted the patient's son Lorelle Gibbs [phone] to safety plan. Per Lorelle Gibbs, he currently does not have any safety issues with the patient returning back home once she is stable for discharge and has the proper resources in place. Son reports the patient has been struggling with an alcohol addiction for 35 years. Son reports having multiple verbal altercations with his mother when she is intoxicated, and son reports she then becomes aggressive. Son reports due to the aggression, he researched ways to help his mother and learned about the IVC process and submitted to have her committed for help. Son reports the patient minimizes her use, however reports when she is under the influence the patient is a totally different person. Son reports he believes she uses alcohol to mask things and to cover up pain. Son reports he knew that the patient would not get help on her own so he went through with the IVC process. Son was made aware of the treatment teams recommendation for residential placement, however patient declined. Son was informed of alternative option of CDIOP for the patient, who reports she is willing to commit to that program. Son reports he believes that would be a good idea at this point. Son asked for additional resources just in case the CDIOP is not successful. LCSW to send additional resources to email provided: [email or web address]. Son reports the family is fine with the patient returning home. No safety concerns and reports he and the patient's boyfriend will transport the patient when she is ready for discharge. No other needs to report at this time.   Fernande Boyden, LCSW Clinical Social Worker [facility] Ph: [phone]

## 2023-03-30 NOTE — Group Note (Signed)
Group Topic: Wellness  Group Date: 03/30/2023 Start Time: 1000 End Time: 1030 Facilitators: Priscille Kluver, NT  Department: [facility]  Number of Participants: 4  Group Focus: goals/reality orientation Treatment Modality:  Skills Training Interventions utilized were support Purpose: reinforce self-care  Name: Gean Quint Date of Birth: 1976/01/10  MR: [id number]    Level of Participation: active Quality of Participation: attentive Interactions with others: gave feedback Mood/Affect: positive Cognition: insightful Progress: Significant Response: Achieve her goals   Patients Problems:  Patient Active Problem List   Diagnosis Date Noted   Alcohol dependence (HCC) 03/29/2023   Alcohol withdrawal (HCC) 04/13/2021   Alcohol use disorder, severe, dependence (HCC) 04/12/2021

## 2023-03-30 NOTE — Progress Notes (Signed)
Pt was visible in the milieu and interacted with peers and staff. Pt was appropriate on the unit. No distress noted or concerns voiced. Staff will monitor for pt's safety.

## 2023-03-30 NOTE — Discharge Instructions (Addendum)
Patient to present to [facility] for intake appt at 8:00am with Les 04/02/23: [address] [place], [place] [address]; [phone]  [facility] [address], [place], [address] [phone] phone  New Patient Assessment/Therapy Walk-Ins:  Monday and Wednesday: 8 am until slots are full. Every 1st and 2nd Fridays of the month: 1 pm - 5 pm.  NO ASSESSMENT/THERAPY WALK-INS ON TUESDAYS OR THURSDAYS  New Patient Assessment/Medication Management Walk-Ins:  Monday - Friday:  8 am - 11 am.  For all walk-ins, we ask that you arrive by 7:30 am because patients will be seen in the order of arrival.  Availability is limited; therefore, you may not be seen on the same day that you walk-in.  Our goal is to serve and meet the needs of our community to the best of our ability.  SUBSTANCE USE TREATMENT for Medicaid and State Funded/IPRS  [facility] [address], [place], [address] [phone] phone NOTE: [facility] is no longer offering IOP services.  Serves those who are low-income or have no insurance.  [facility] [address], [place], [place], [address] [phone] phone [phone] fax NOTE: Does have Substance Abuse-Intensive Outpatient Program [facility]) as well as transitional housing if eligible.  [facility] [address] [place], [place], [address] [phone] phone [phone] fax  [facility] [phone] [place], [place], [address] [phone] phone [phone] fax  HALFWAY HOUSES:  [facility] [phone]  [email or web address]  12 STEP PROGRAMS:  Alcoholics Anonymous of [place] Software[employer]alet.be  Narcotics Anonymous of [place] [employer]ct.dk  Al-Anon of [place], [place] www.[place]alanon.org/find-meetings.html  Nar-Anon  https://nar-anon.org/find-a-meetin  List of Residential placements:   [facility] in [place]: [phone]  [facility]: [phone]  [place], [place] [phone]: Female and female facility; 30-day program: (uninsured and Medicaid such as Laurena Bering, [place], [facility], partners)  [facility]: [phone]; men and women's facility; 28 days; Can have Medicaid tailored plan Tour manager or Partners)  [facility]: [phone] Karoline [place] or Larita Fife; 28 day program; must be fully detox; tailored Medicaid or no insurance  [address] in [place], [place]; [phone]; 28 day all males program; no insurance accepted  [facility] in [place]: Gabriel Rung [phone] (no insurance or Medicaid only); 90 days; outpatient services but provide housing in apartments [place]  [facility] Admission: [phone]: Patient must complete phone screening for placement: [place], [place]; 6 month program; uninsured, Medicaid, and [employer].   [facility]: no insurance required; [phone]  [facility]: [phone]; Intake: Molly Maduro; Must fill out application online; Alecia Lemming Delay [phone] x [phone] [facility] in [place], [place]: [phone]; Admissions Coordinators Mr. Maurine Minister or Barron Alvine; 90 day program.  [facility]: [place], [place] [phone]; Co-Ed 9 month to a year program; Online application; Men entry fee is $500 (6-30months);  [employer]: [address] [place], [place] [address]; no fee or insurance required; minimum of 2 years; Highly structured; work based; Intake Coordinator is Thayer Ohm [phone]  [facility] in [place], [place]: [phone]; Fax number is [phone]; website: www.[facility].org; Requires 3-6 page autobiography; 2 year program (18 months and then 74month transitional housing); Admission fee is $300; no insurance needed; work  Automotive engineer in [place], [place]: [employer] Desk Staff: Danise Edge [phone]: They have a Men's Regenerations Program 6-64months. Free program; There is an initial $300 fee however, they are willing to  work with patients regarding that. Application is online.  [facility]: Admissions [phone] Doran Heater ext [phone]; Any 7-90 day program is out of pocket; 12 month program is free of charge; there is a $275 entry fee; Patient is responsible for own transportation

## 2023-03-30 NOTE — ED Notes (Signed)
Patient is sleeping. Respirations equal and unlabored, skin warm and dry. No change in assessment or acuity. Routine safety checks conducted according to facility protocol. Will continue to monitor for safety.   

## 2023-03-30 NOTE — Progress Notes (Signed)
Patient reported pain rating 7/10 in hands upon scheduled med pass. PRN medication given. No further complaints at this time.

## 2023-03-31 DIAGNOSIS — F419 Anxiety disorder, unspecified: Secondary | ICD-10-CM | POA: Diagnosis not present

## 2023-03-31 DIAGNOSIS — R110 Nausea: Secondary | ICD-10-CM | POA: Diagnosis not present

## 2023-03-31 DIAGNOSIS — F1010 Alcohol abuse, uncomplicated: Secondary | ICD-10-CM | POA: Diagnosis not present

## 2023-03-31 NOTE — ED Notes (Signed)
Pt awake with c/o pain in her hand, requested tylenol. PRN med provided

## 2023-03-31 NOTE — ED Notes (Signed)
Patient was provided breakfast

## 2023-03-31 NOTE — ED Provider Notes (Signed)
Behavioral Health Progress Note  Date and Time: 03/31/2023 8:33 AM Name: [name] MRN:  [id number]  Subjective:  [name] is a 47 year old female with alcohol use disorder who presented involuntary (IVC from her son) to the [facility] for alcohol detox and arrangement for treatment.   Patient reports feeling "okay" today, although "disappointed as I thought I would be able to return home today." The detox protocol is explained to her, particularly ensuring completion under IVC, and she voices understanding. We do plan to give her final dose tomorrow morning, prior to discharge, and she is agreeable with this. Per LCSW note, son feels safe with her returning home once detox is complete tomorrow. She states that she feels physically well and denies any withdrawal symptoms nor AE of medications. She is motivated for treatment and plans to go to [facility] first thing Monday morning, as scheduled. Endorses no current depression, anxiety, SI, HI, or AVH. Patient reports she slept well last night and has a good appetite.  Diagnosis:  Final diagnoses:  Alcohol use disorder    Total Time spent with patient: 30 minutes  Past Psychiatric History: AUD (tried Zoloft in the past and had fair repsonse. Did not want to restart at this time)   Is the patient at risk to self? Yes  Has the patient been a risk to self in the past 6 months? No .    Has the patient been a risk to self within the distant past? No   Is the patient a risk to others? No   Has the patient been a risk to others in the past 6 months? No   Has the patient been a risk to others within the distant past? No    Past Medical History: Rheumatological condition with hands Family History: Per patient her father has a history of PTSD  Social History: Lives in [place] with her father, 2 sons (ages 49 and 68) Unemployed Partner of 3 years is her support system Denies legal history  Additional Social History:                          Sleep: Good  Appetite:  Good  Current Medications:  Current Facility-Administered Medications  Medication Dose Route Frequency Provider Last Rate Last Admin   acetaminophen (TYLENOL) tablet 650 mg  650 mg Oral Q6H PRN White, Patrice L, NP   650 mg at 03/31/23 0401   alum & mag hydroxide-simeth (MAALOX/MYLANTA) 200-200-20 MG/5ML suspension 30 mL  30 mL Oral Q4H PRN White, Patrice L, NP       chlordiazePOXIDE (LIBRIUM) capsule 25 mg  25 mg Oral Q6H PRN White, Patrice L, NP       chlordiazePOXIDE (LIBRIUM) capsule 25 mg  25 mg Oral TID White, Patrice L, NP   25 mg at 03/30/23 2138   Followed by   chlordiazePOXIDE (LIBRIUM) capsule 25 mg  25 mg Oral BH-qamhs White, Patrice L, NP       Followed by   Melene Muller ON 04/01/2023] chlordiazePOXIDE (LIBRIUM) capsule 25 mg  25 mg Oral Daily White, Patrice L, NP       cloNIDine (CATAPRES) tablet 0.1 mg  0.1 mg Oral BID Kizzie Ide B, MD   0.1 mg at 03/30/23 2138   hydrOXYzine (ATARAX) tablet 25 mg  25 mg Oral TID PRN White, Patrice L, NP   25 mg at 03/30/23 0837   loperamide (IMODIUM) capsule 2-4 mg  2-4 mg  Oral PRN White, Patrice L, NP       magnesium hydroxide (MILK OF MAGNESIA) suspension 30 mL  30 mL Oral Daily PRN White, Patrice L, NP       multivitamin with minerals tablet 1 tablet  1 tablet Oral Daily White, Patrice L, NP   1 tablet at 03/30/23 0836   naltrexone (DEPADE) tablet 50 mg  50 mg Oral Daily Kizzie Ide B, MD       ondansetron (ZOFRAN-ODT) disintegrating tablet 4 mg  4 mg Oral Q6H PRN White, Patrice L, NP   4 mg at 03/29/23 2119   thiamine (VITAMIN B1) tablet 100 mg  100 mg Oral Daily White, Patrice L, NP   100 mg at 03/30/23 0836   traZODone (DESYREL) tablet 50 mg  50 mg Oral QHS PRN White, Patrice L, NP   50 mg at 03/30/23 2138   Current Outpatient Medications  Medication Sig Dispense Refill   busPIRone (BUSPAR) 15 MG tablet Take 1 tablet (15 mg total) by mouth See admin instructions. Take one tablet by mouth three times daily  and one additional tablet if needed      Labs  Lab Results:  Admission on 03/28/2023, Discharged on 03/29/2023  Component Date Value Ref Range Status   WBC 03/28/2023 3.9 (L)  4.0 - 10.5 K/uL Final   RBC 03/28/2023 3.97  3.87 - 5.11 MIL/uL Final   Hemoglobin 03/28/2023 12.1  12.0 - 15.0 g/dL Final   HCT 41/32/4401 36.3  36.0 - 46.0 % Final   MCV 03/28/2023 91.4  80.0 - 100.0 fL Final   MCH 03/28/2023 30.5  26.0 - 34.0 pg Final   MCHC 03/28/2023 33.3  30.0 - 36.0 g/dL Final   RDW 02/72/5366 14.1  11.5 - 15.5 % Final   Platelets 03/28/2023 181  150 - 400 K/uL Final   nRBC 03/28/2023 0.0  0.0 - 0.2 % Final   Neutrophils Relative % 03/28/2023 47  % Final   Neutro Abs 03/28/2023 1.8  1.7 - 7.7 K/uL Final   Lymphocytes Relative 03/28/2023 39  % Final   Lymphs Abs 03/28/2023 1.5  0.7 - 4.0 K/uL Final   Monocytes Relative 03/28/2023 10  % Final   Monocytes Absolute 03/28/2023 0.4  0.1 - 1.0 K/uL Final   Eosinophils Relative 03/28/2023 3  % Final   Eosinophils Absolute 03/28/2023 0.1  0.0 - 0.5 K/uL Final   Basophils Relative 03/28/2023 1  % Final   Basophils Absolute 03/28/2023 0.1  0.0 - 0.1 K/uL Final   Immature Granulocytes 03/28/2023 0  % Final   Abs Immature Granulocytes 03/28/2023 0.01  0.00 - 0.07 K/uL Final   Performed at [facility], [address], [place], [place] [address]   Sodium 03/28/2023 143  135 - 145 mmol/L Final   Potassium 03/28/2023 3.2 (L)  3.5 - 5.1 mmol/L Final   Chloride 03/28/2023 101  98 - 111 mmol/L Final   CO2 03/28/2023 25  22 - 32 mmol/L Final   Glucose, Bld 03/28/2023 102 (H)  70 - 99 mg/dL Final   Glucose reference range applies only to samples taken after fasting for at least 8 hours.   BUN 03/28/2023 12  6 - 20 mg/dL Final   Creatinine, Ser 03/28/2023 0.69  0.44 - 1.00 mg/dL Final   Calcium 74/25/9563 9.1  8.9 - 10.3 mg/dL Final   Total Protein 87/56/4332 7.2  6.5 - 8.1 g/dL Final   Albumin 95/18/8416 4.1  3.5 -  5.0 g/dL Final   AST  78/29/5621 40  15 - 41 U/L Final   ALT 03/28/2023 15  0 - 44 U/L Final   Alkaline Phosphatase 03/28/2023 59  38 - 126 U/L Final   Total Bilirubin 03/28/2023 1.0  0.3 - 1.2 mg/dL Final   GFR, Estimated 03/28/2023 >60  >60 mL/min Final   Comment: (NOTE) Calculated using the CKD-EPI Creatinine Equation (2021)    Anion gap 03/28/2023 17 (H)  5 - 15 Final   Performed at [facility], [address] [place], [place] [address]   Alcohol, Ethyl (B) 03/28/2023 281 (H)  <10 mg/dL Final   Comment: (NOTE) Lowest detectable limit for serum alcohol is 10 mg/dL.  For medical purposes only. Performed at [facility], [address], [place], [place] [address]    TSH 03/28/2023 0.857  0.350 - 4.500 uIU/mL Final   Comment: Performed by a 3rd Generation assay with a functional sensitivity of <=0.01 uIU/mL. Performed at [facility], [address] [place], [place] [address]    Preg Test, Ur 03/28/2023 Negative  Negative Final   POC Amphetamine UR 03/28/2023 None Detected  NONE DETECTED (Cut Off Level 1000 ng/mL) Final   POC Secobarbital (BAR) 03/28/2023 None Detected  NONE DETECTED (Cut Off Level 300 ng/mL) Final   POC Buprenorphine (BUP) 03/28/2023 None Detected  NONE DETECTED (Cut Off Level 10 ng/mL) Final   POC Oxazepam (BZO) 03/28/2023 None Detected  NONE DETECTED (Cut Off Level 300 ng/mL) Final   POC Cocaine UR 03/28/2023 None Detected  NONE DETECTED (Cut Off Level 300 ng/mL) Final   POC Methamphetamine UR 03/28/2023 None Detected  NONE DETECTED (Cut Off Level 1000 ng/mL) Final   POC Morphine 03/28/2023 None Detected  NONE DETECTED (Cut Off Level 300 ng/mL) Final   POC Methadone UR 03/28/2023 None Detected  NONE DETECTED (Cut Off Level 300 ng/mL) Final   POC Oxycodone UR 03/28/2023 None Detected  NONE DETECTED (Cut Off Level 100 ng/mL) Final   POC Marijuana UR 03/28/2023 Positive (A)  NONE DETECTED (Cut Off Level 50 ng/mL) Final  Admission on 03/01/2023, Discharged on  03/01/2023  Component Date Value Ref Range Status   SARS Coronavirus 2 by RT PCR 03/01/2023 NEGATIVE  NEGATIVE Final   Performed at [facility], [address], [place], [place] [address]   Sodium 03/01/2023 137  135 - 145 mmol/L Final   Potassium 03/01/2023 3.6  3.5 - 5.1 mmol/L Final   Chloride 03/01/2023 97 (L)  98 - 111 mmol/L Final   CO2 03/01/2023 23  22 - 32 mmol/L Final   Glucose, Bld 03/01/2023 100 (H)  70 - 99 mg/dL Final   Glucose reference range applies only to samples taken after fasting for at least 8 hours.   BUN 03/01/2023 10  6 - 20 mg/dL Final   Creatinine, Ser 03/01/2023 0.80  0.44 - 1.00 mg/dL Final   Calcium 44/07/270 9.4  8.9 - 10.3 mg/dL Final   Total Protein 53/66/4403 7.8  6.5 - 8.1 g/dL Final   Albumin 47/42/5956 4.4  3.5 - 5.0 g/dL Final   AST 38/75/6433 61 (H)  15 - 41 U/L Final   ALT 03/01/2023 27  0 - 44 U/L Final   Alkaline Phosphatase 03/01/2023 62  38 - 126 U/L Final   Total Bilirubin 03/01/2023 2.6 (H)  0.3 - 1.2 mg/dL Final   GFR, Estimated 03/01/2023 >60  >60 mL/min Final   Comment: (NOTE)  Calculated using the CKD-EPI Creatinine Equation (2021)    Anion gap 03/01/2023 17 (H)  5 - 15 Final   Performed at [facility], [address], [place], [place] [address]   WBC 03/01/2023 4.0  4.0 - 10.5 K/uL Final   RBC 03/01/2023 3.90  3.87 - 5.11 MIL/uL Final   Hemoglobin 03/01/2023 11.8 (L)  12.0 - 15.0 g/dL Final   HCT 60/45/4098 35.3 (L)  36.0 - 46.0 % Final   MCV 03/01/2023 90.5  80.0 - 100.0 fL Final   MCH 03/01/2023 30.3  26.0 - 34.0 pg Final   MCHC 03/01/2023 33.4  30.0 - 36.0 g/dL Final   RDW 11/91/4782 14.6  11.5 - 15.5 % Final   Platelets 03/01/2023 171  150 - 400 K/uL Final   nRBC 03/01/2023 0.0  0.0 - 0.2 % Final   Neutrophils Relative % 03/01/2023 63  % Final   Neutro Abs 03/01/2023 2.5  1.7 - 7.7 K/uL Final   Lymphocytes Relative 03/01/2023 26  % Final   Lymphs Abs 03/01/2023 1.0  0.7 - 4.0 K/uL Final   Monocytes Relative  03/01/2023 9  % Final   Monocytes Absolute 03/01/2023 0.4  0.1 - 1.0 K/uL Final   Eosinophils Relative 03/01/2023 1  % Final   Eosinophils Absolute 03/01/2023 0.0  0.0 - 0.5 K/uL Final   Basophils Relative 03/01/2023 1  % Final   Basophils Absolute 03/01/2023 0.1  0.0 - 0.1 K/uL Final   Immature Granulocytes 03/01/2023 0  % Final   Abs Immature Granulocytes 03/01/2023 0.01  0.00 - 0.07 K/uL Final   Performed at [facility], [address] [place], [place] [address]   Troponin I (High Sensitivity) 03/01/2023 4  <18 ng/L Final   Comment: (NOTE) Elevated high sensitivity troponin I (hsTnI) values and significant  changes across serial measurements may suggest ACS but many other  chronic and acute conditions are known to elevate hsTnI results.  Refer to the "Links" section for chest pain algorithms and additional  guidance. Performed at [facility], [address], [place], [place] [address]    Troponin I (High Sensitivity) 03/01/2023 6  <18 ng/L Final   Comment: (NOTE) Elevated high sensitivity troponin I (hsTnI) values and significant  changes across serial measurements may suggest ACS but many other  chronic and acute conditions are known to elevate hsTnI results.  Refer to the "Links" section for chest pain algorithms and additional  guidance. Performed at [facility], [address] [place], [place] [address]   Admission on 02/26/2023, Discharged on 02/26/2023  Component Date Value Ref Range Status   SARS Coronavirus 2 by RT PCR 02/26/2023 NEGATIVE  NEGATIVE Final   Performed at [facility], [address], [place], [place] [address]   Sodium 02/26/2023 141  135 - 145 mmol/L Final   Potassium 02/26/2023 3.5  3.5 - 5.1 mmol/L Final   Chloride 02/26/2023 99  98 - 111 mmol/L Final   CO2 02/26/2023 23  22 - 32 mmol/L Final   Glucose, Bld 02/26/2023 144 (H)  70 - 99 mg/dL Final   Glucose reference range applies only to samples taken after fasting for at  least 8 hours.   BUN 02/26/2023 10  6 - 20 mg/dL Final   Creatinine, Ser 02/26/2023 0.95  0.44 - 1.00 mg/dL Final   Calcium 84/13/2440 9.5  8.9 - 10.3 mg/dL Final   GFR, Estimated 02/26/2023 >60  >60  mL/min Final   Comment: (NOTE) Calculated using the CKD-EPI Creatinine Equation (2021)    Anion gap 02/26/2023 19 (H)  5 - 15 Final   Performed at [facility], [address] [place], [place] [address]   WBC 02/26/2023 4.8  4.0 - 10.5 K/uL Final   RBC 02/26/2023 4.33  3.87 - 5.11 MIL/uL Final   Hemoglobin 02/26/2023 13.1  12.0 - 15.0 g/dL Final   HCT 60/45/4098 39.0  36.0 - 46.0 % Final   MCV 02/26/2023 90.1  80.0 - 100.0 fL Final   MCH 02/26/2023 30.3  26.0 - 34.0 pg Final   MCHC 02/26/2023 33.6  30.0 - 36.0 g/dL Final   RDW 11/91/4782 14.8  11.5 - 15.5 % Final   Platelets 02/26/2023 282  150 - 400 K/uL Final   nRBC 02/26/2023 0.0  0.0 - 0.2 % Final   Performed at [facility], [address] [place], [place] [address]   Troponin I (High Sensitivity) 02/26/2023 4  <18 ng/L Final   Comment: (NOTE) Elevated high sensitivity troponin I (hsTnI) values and significant  changes across serial measurements may suggest ACS but many other  chronic and acute conditions are known to elevate hsTnI results.  Refer to the "Links" section for chest pain algorithms and additional  guidance. Performed at [facility], [address], [place], [place] [address]    Preg, Serum 02/26/2023 NEGATIVE  NEGATIVE Final   Comment:        THE SENSITIVITY OF THIS METHODOLOGY IS >10 mIU/mL. Performed at [facility], [address] [place], [place] [address]    Alcohol, Ethyl (B) 02/26/2023 233 (H)  <10 mg/dL Final   Comment: (NOTE) Lowest detectable limit for serum alcohol is 10 mg/dL.  For medical purposes only. Performed at [facility], [address] [place], [place] [address]    Troponin I (High Sensitivity) 02/26/2023 4  <18 ng/L Final   Comment: (NOTE) Elevated  high sensitivity troponin I (hsTnI) values and significant  changes across serial measurements may suggest ACS but many other  chronic and acute conditions are known to elevate hsTnI results.  Refer to the "Links" section for chest pain algorithms and additional  guidance. Performed at [facility], [address], [place], [place] [address]    Total Protein 02/26/2023 8.2 (H)  6.5 - 8.1 g/dL Final   Albumin 44/07/270 4.3  3.5 - 5.0 g/dL Final   AST 53/66/4403 69 (H)  15 - 41 U/L Final   ALT 02/26/2023 30  0 - 44 U/L Final   Alkaline Phosphatase 02/26/2023 61  38 - 126 U/L Final   Total Bilirubin 02/26/2023 1.0  0.3 - 1.2 mg/dL Final   Bilirubin, Direct 02/26/2023 0.1  0.0 - 0.2 mg/dL Final   Indirect Bilirubin 02/26/2023 0.9  0.3 - 0.9 mg/dL Final   Performed at [facility], [address], [place], [place] [address]   Lipase 02/26/2023 33  11 - 51 U/L Final   Performed at [facility], [address], [place], [place] [address]    Blood Alcohol level:  Lab Results  Component Value Date   ETH 281 (H) 03/28/2023   ETH 233 (H) 02/26/2023    Metabolic Disorder Labs: Lab Results  Component Value Date   HGBA1C 5.2 04/12/2021   MPG 102.54 04/12/2021   No results found for: "PROLACTIN" Lab Results  Component Value Date  CHOL 207 (H) 04/12/2021   TRIG 51 04/12/2021   HDL 120 04/12/2021   CHOLHDL 1.7 04/12/2021   VLDL 10 04/12/2021   LDLCALC 77 04/12/2021    Therapeutic Lab Levels: No results found for: "LITHIUM" No results found for: "VALPROATE" No results found for: "CBMZ"  Physical Findings   PHQ2-9    Flowsheet Row ED from 03/29/2023 in [facility]  PHQ-2 Total Score 0      Flowsheet Row ED from 03/29/2023 in [facility] ED from 03/28/2023 in [facility] ED from 03/01/2023 in [facility] Emergency Department at [facility]  C-SSRS RISK CATEGORY No  Risk No Risk No Risk        Musculoskeletal  Strength & Muscle Tone: within normal limits Gait & Station: normal Patient leans: N/A  Psychiatric Specialty Exam  Presentation  General Appearance:  Appropriate for Environment; Well Groomed  Eye Contact: Good  Speech: Clear and Coherent  Speech Volume: Normal  Handedness: Right   Mood and Affect  Mood: Euthymic But somewhat irritable, as she thought she would return home today.   Affect: Congruent   Thought Process  Thought Processes: Coherent; Linear  Descriptions of Associations:Intact  Orientation:Full (Time, Place and Person)  Thought Content:Logical  Diagnosis of Schizophrenia or Schizoaffective disorder in past: No    Hallucinations:Hallucinations: None  Ideas of Reference:None  Suicidal Thoughts:Suicidal Thoughts: No  Homicidal Thoughts:Homicidal Thoughts: No   Sensorium  Memory: Immediate Good; Recent Good; Remote Good  Judgment: Good  Insight: Good   Executive Functions  Concentration: Good  Attention Span: Good  Recall: Good  Fund of Knowledge: Good  Language: Good   Psychomotor Activity  Psychomotor Activity: Psychomotor Activity: Normal   Assets  Assets: Communication Skills; Resilience; Desire for Improvement; Leisure Time; Physical Health   Sleep  Sleep: Sleep: Good   Nutritional Assessment (For OBS and FBC admissions only) Has the patient had a weight loss or gain of 10 pounds or more in the last 3 months?: No Has the patient had a decrease in food intake/or appetite?: No Does the patient have dental problems?: No Does the patient have eating habits or behaviors that may be indicators of an eating disorder including binging or inducing vomiting?: No Has the patient recently lost weight without trying?: 0 Has the patient been eating poorly because of a decreased appetite?: 0 Malnutrition Screening Tool Score: 0    Physical Exam  Physical  Exam Constitutional:      General: She is not in acute distress.    Appearance: Normal appearance.  HENT:     Head: Normocephalic and atraumatic.     Nose: Nose normal.     Mouth/Throat:     Mouth: Mucous membranes are moist.     Pharynx: Oropharynx is clear.  Eyes:     Extraocular Movements: Extraocular movements intact.     Conjunctiva/sclera: Conjunctivae normal.     Pupils: Pupils are equal, round, and reactive to light.  Cardiovascular:     Rate and Rhythm: Normal rate.  Pulmonary:     Effort: Pulmonary effort is normal.  Abdominal:     General: Abdomen is flat.     Palpations: Abdomen is soft.  Musculoskeletal:        General: Normal range of motion.     Cervical back: Normal range of motion and neck supple.  Skin:    General: Skin is warm and dry.  Neurological:  General: No focal deficit present.     Mental Status: She is alert and oriented to person, place, and time.  Psychiatric:        Mood and Affect: Mood normal.   Review of Systems  Constitutional: Negative.   HENT: Negative.    Eyes: Negative.   Respiratory: Negative.    Cardiovascular: Negative.   Gastrointestinal: Negative.   Genitourinary: Negative.   Musculoskeletal: Negative.   Skin: Negative.   Neurological: Negative.   Psychiatric/Behavioral:  Positive for substance abuse. Negative for depression, hallucinations, memory loss and suicidal ideas. The patient is not nervous/anxious and does not have insomnia.    Blood pressure (!) 132/91, pulse (!) 58, temperature 98.1 F (36.7 C), temperature source Oral, resp. rate 18, SpO2 100%. There is no height or weight on file to calculate BMI.  Treatment Plan Summary: Ryn Ogrady is a 47 year old female with alcohol use disorder admitted to [facility] involuntarily for alcohol detox and arrangement of follow-up treatment. She remains motivated to go to [facility] on Monday for CDIOP. She denies concerns or complaints today. She is agreeable with the plan for detox  completion tomorrow with subsequent discharge. She poses no safety concerns to herself nor others. Safety planning completed with son by Georga Kaufmann.   Alcohol Use Disorder Alcohol Withdrawal EtOH 281 9/19. LFTs WNL.  -Continue librium taper (started 9/19); will complete tomorrow -Clonidine 0.1 mg scheduled bid for elevated BP- last day on 9/21, renew if BP continues to be elevated - Continue Naltrexone 50mg  daily to reduce alcohol cravings -D/c CIWA monitoring, as scores <4 for > 48 hours.  -Thiamine 100 mg IM first day and PO after that -Multivitamin with minerals daily -Tylenol 650 mg every 6 hours as needed for pain -Zofran 4 mg every 6 hours as needed for nausea or vomiting -Imodium 2 to 4 mg as needed for diarrhea or loose stools  -Maalox/Mylanta 30 mL every 4 hours as needed for indigestion -Milk of Mag 30 mL as needed for constipation  -Hydroxyzine 25 mg q6h PRN for anxiety -Trazodone 50 mg qhs PRN for sleep   Hypokalemia K is 3.2. Treated 40 meq KCl   Dispo: plan to discharge Sunday with referral for CDIOP scheduled for Monday  -Safety planning completed with son by LCSW on 9/20.  Daily contact with patient to assess and evaluate symptoms and progress in treatment and Medication management  Lamar Sprinkles, MD 03/31/2023 8:33 AM

## 2023-03-31 NOTE — ED Notes (Signed)
Pt observed/assessed in room sleeping. RR even and unlabored, appearing in no noted distress. Environmental check complete, will continue to monitor for safety 

## 2023-03-31 NOTE — ED Notes (Signed)
Patient was provided lunch.

## 2023-03-31 NOTE — ED Notes (Signed)
Pt sleeping at present, no distress noted.  Monitoring for safety. 

## 2023-03-31 NOTE — ED Notes (Signed)
 Patient was provided dinner

## 2023-03-31 NOTE — Group Note (Signed)
Group Topic: Positive Affirmations  Group Date: 03/31/2023 Start Time: 1100 End Time: 1145 Facilitators: Cassandria Anger  Department: [facility]  Number of Participants: 6  Group Focus: affirmation Treatment Modality:  Psychoeducation Interventions utilized were patient education Purpose: express feelings and increase insight  Name: [name] Date of Birth: 03-31-1976  MR: [id number]    Level of Participation: moderate Quality of Participation: attentive, cooperative, engaged, motivated, and offered feedback Interactions with others: gave feedback Mood/Affect: appropriate, bright, and positive Triggers (if applicable): N/A Cognition: coherent/clear, goal directed, and insightful Progress: Moderate Response: Patient expressed goal to achieve awareness. Awareness to future responses and how to take it day by day Plan: patient will be encouraged to continue to attend group  Patients Problems:  Patient Active Problem List   Diagnosis Date Noted   Alcohol dependence (HCC) 03/29/2023   Alcohol withdrawal (HCC) 04/13/2021   Alcohol use disorder, severe, dependence (HCC) 04/12/2021

## 2023-03-31 NOTE — ED Notes (Signed)
Pt sitting in dayroom interacting with peers and watching tv. No acute distress noted. No concerns voiced. Informed pt to notify staff with any needs or assistance. Pt verbalized understanding or agreement. Will continue to monitor for safety.

## 2023-03-31 NOTE — ED Notes (Signed)
Pt A& O x 4 watching TV in dayroom, calm & cooperative, no distress noted.  Monitoring for safety.

## 2023-03-31 NOTE — ED Notes (Signed)
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 Tylenol 650mg  given. Pt tolerated well. Pt returned to Dayroom. Interacting well with peers. Will continue to monitor for safety.

## 2023-03-31 NOTE — ED Notes (Signed)
Patient A&Ox4. Denies intent to harm self/others when asked. Denies A/VH. Patient denies any physical complaints when asked. No acute distress noted. Support and encouragement provided. Routine safety checks conducted according to facility protocol. Encouraged patient to notify staff if thoughts of harm toward self or others arise. Patient verbalize understanding and agreement. Will continue to monitor for safety.    

## 2023-04-01 DIAGNOSIS — R110 Nausea: Secondary | ICD-10-CM | POA: Diagnosis not present

## 2023-04-01 DIAGNOSIS — F1010 Alcohol abuse, uncomplicated: Secondary | ICD-10-CM | POA: Diagnosis not present

## 2023-04-01 DIAGNOSIS — F419 Anxiety disorder, unspecified: Secondary | ICD-10-CM | POA: Diagnosis not present

## 2023-04-01 MED ORDER — NALTREXONE HCL 50 MG PO TABS: 50.0000 mg | ORAL_TABLET | Freq: Every day | ORAL | 0 refills | Status: AC

## 2023-04-01 MED ORDER — TRAZODONE HCL 50 MG PO TABS: 50.0000 mg | ORAL_TABLET | Freq: Every evening | ORAL | 0 refills | Status: DC | PRN

## 2023-04-01 MED ORDER — HYDROXYZINE HCL 25 MG PO TABS: 25.0000 mg | ORAL_TABLET | Freq: Three times a day (TID) | ORAL | 0 refills | Status: DC | PRN
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
## 2023-04-01 NOTE — ED Notes (Signed)
Pt sleeping at present, no distress noted.  Monitoring for safety. 

## 2023-04-01 NOTE — ED Notes (Signed)
Patient A&Ox4. Denies intent to harm self/others when asked. Denies A/VH. Patient denies any physical complaints when asked. No acute distress noted. Pt states, "I slept so good last night. I don't even remember dreaming. I'm ready to go home and prepare for my first meeting tomorrow". Praise and support provided. Routine safety checks conducted according to facility protocol. Encouraged patient to notify staff if thoughts of harm toward self or others arise. Patient verbalize understanding and agreement. Will continue to monitor for safety.

## 2023-04-01 NOTE — ED Notes (Signed)
Patient A&O x 4, ambulatory. Patient discharged in no acute distress. Patient denied SI/HI, A/VH upon discharge. Patient verbalized understanding of all discharge instructions explained by staff, to include follow up appointments, RX's and safety plan. Patient reported mood 10/10.  Pt belongings returned to patient from locker # 9 intact. Patient escorted to lobby via staff for transport to home via her boyfriend. Safety maintained.

## 2023-04-26 ENCOUNTER — Other Ambulatory Visit: Payer: Self-pay | Admitting: *Deleted

## 2023-04-26 ENCOUNTER — Other Ambulatory Visit: Payer: Self-pay | Admitting: Vascular Surgery

## 2023-04-26 ENCOUNTER — Encounter: Payer: MEDICAID | Admitting: Vascular Surgery

## 2023-04-26 ENCOUNTER — Telehealth: Payer: Self-pay | Admitting: Vascular Surgery

## 2023-04-26 ENCOUNTER — Ambulatory Visit (HOSPITAL_COMMUNITY)
Admission: RE | Admit: 2023-04-26 | Discharge: 2023-04-26 | Disposition: A | Discharge status: Home / Self Care | Payer: MEDICAID | Source: Ambulatory Visit | Attending: Vascular Surgery | Admitting: Vascular Surgery

## 2023-04-26 DIAGNOSIS — M79603 Pain in arm, unspecified: Secondary | ICD-10-CM

## 2023-04-27 ENCOUNTER — Ambulatory Visit (INDEPENDENT_AMBULATORY_CARE_PROVIDER_SITE_OTHER): Payer: MEDICAID | Admitting: Vascular Surgery

## 2023-04-27 ENCOUNTER — Other Ambulatory Visit: Payer: Self-pay

## 2023-04-27 ENCOUNTER — Encounter: Payer: Self-pay | Admitting: Vascular Surgery

## 2023-04-27 VITALS — BP 156/96 | HR 85 | Temp 98.4°F | Ht 63.0 in | Wt 113.0 lb

## 2023-04-27 DIAGNOSIS — S61209A Unspecified open wound of unspecified finger without damage to nail, initial encounter: Secondary | ICD-10-CM

## 2023-04-27 MED ORDER — ASPIRIN 81 MG PO TBEC: 81.0000 mg | DELAYED_RELEASE_TABLET | Freq: Every day | ORAL | 12 refills | Status: DC

## 2023-04-27 MED ORDER — ATORVASTATIN CALCIUM 20 MG PO TABS: 20.0000 mg | ORAL_TABLET | Freq: Every day | ORAL | 2 refills | Status: DC

## 2023-04-27 NOTE — Progress Notes (Addendum)
Patient ID: [name], female   DOB: April 23, 1976, 47 y.o.   MRN: [id number]  Reason for Consult: New Patient (Initial Visit)   Referred by Morrell Riddle, PA-C  Subjective:     HPI  [name] is a 47 y.o. female without significant past medical history who presents for bilateral finger wounds.  She reports she has pain in her fingers as well especially in the cold.  She develops wounds on her fingertips for which she does local wound care and have been able to get them to heal.  She also has some tightness and resistance in her range of motion on a few fingers.  She reports spots under her fingernails that she has noticed for a long time and she does remember having random episodes of pain in her arms and legs as a child as well.  She denies IV drug use.  She does smoke cigarettes on most days.  She is not on aspirin or statin.  Past Medical History:  Diagnosis Date   History of cervical dysplasia    20 yrs ago    PTSD (post-traumatic stress disorder)    Sarcoma (HCC)    Family History  Problem Relation Age of Onset   Hypertension Mother    Breast cancer Mother    Diabetes Father    Hypertension Father    Breast cancer Paternal Grandmother    Past Surgical History:  Procedure Laterality Date   APPENDECTOMY     CESAREAN SECTION      Short Social History:  Social History   Tobacco Use   Smoking status: Some Days    Types: Cigarettes   Smokeless tobacco: Never  Substance Use Topics   Alcohol use: Yes    Comment: 1/2 pint daily    No Known Allergies  Current Outpatient Medications  Medication Sig Dispense Refill   amLODipine (NORVASC) 2.5 MG tablet Take 2.5 mg by mouth daily.     aspirin EC 81 MG tablet Take 1 tablet (81 mg total) by mouth daily. Swallow whole. 30 tablet 12   atorvastatin (LIPITOR) 20 MG tablet Take 1 tablet (20 mg total) by mouth daily. 30 tablet 2   hydrOXYzine (VISTARIL) 25 MG capsule Take by mouth.     naltrexone (DEPADE) 50 MG  tablet Take 1 tablet (50 mg total) by mouth daily. 30 tablet 0   naproxen (NAPROSYN) 500 MG tablet Take 500 mg by mouth 2 (two) times daily with a meal.     traZODone (DESYREL) 50 MG tablet Take 1 tablet (50 mg total) by mouth at bedtime as needed for sleep. 30 tablet 0   No current facility-administered medications for this visit.    REVIEW OF SYSTEMS  Negative other than noted in HPI     Objective:  Objective   Vitals:   04/27/23 1420  BP: (!) 156/96  Pulse: 85  Temp: 98.4 F (36.9 C)  SpO2: 97%  Weight: 113 lb (51.3 kg)  Height: 5\' 3"  (1.6 m)   Body mass index is 20.02 kg/m.  Physical Exam General: no acute distress Cardiac: hemodynamically stable, nontachycardic Pulm: normal work of breathing Neuro: alert, no focal deficit Extremities: Clubbing of all 10 fingers.  Dry gangrenous wound of tip of left pinky.  Stiffness in multiple fingers. Vascular:   Right: Palpable brachial, radial, ulnar, DP and PT  Left: Brachial, radial, ulnar, DP and PT   Data: Right Doppler Findings:  +---------------+----------+----------+--------+--------+  Site  PSV (cm/s)Waveform  StenosisComments  +---------------+----------+----------+--------+--------+  Subclavian Prox94        triphasic                   +---------------+----------+----------+--------+--------+  Subclavian Mid 44        triphasic                   +---------------+----------+----------+--------+--------+  Subclavian Dist71        monophasic                  +---------------+----------+----------+--------+--------+  Axillary      86        triphasic                   +---------------+----------+----------+--------+--------+  Brachial Prox  126       triphasic                   +---------------+----------+----------+--------+--------+  Brachial Mid   52        triphasic                   +---------------+----------+----------+--------+--------+  Brachial Dist  79         triphasic                   +---------------+----------+----------+--------+--------+  Radial Prox    70        biphasic                    +---------------+----------+----------+--------+--------+  Radial Mid     64        biphasic                    +---------------+----------+----------+--------+--------+  Radial Dist    36        monophasic        brisk     +---------------+----------+----------+--------+--------+  Ulnar Prox     68        triphasic                   +---------------+----------+----------+--------+--------+  Ulnar Mid      33        biphasic                    +---------------+----------+----------+--------+--------+  Ulnar Dist     19        monophasic        brisk     +---------------+----------+----------+--------+--------+  Palmar Arch    15        monophasic        patent    +---------------+----------+----------+--------+--------+     Left Doppler Findings:  +---------------+----------+----------+--------+--------+  Site          PSV (cm/s)Waveform  StenosisComments  +---------------+----------+----------+--------+--------+  Subclavian Prox120       triphasic                   +---------------+----------+----------+--------+--------+  Subclavian Mid 82        triphasic                   +---------------+----------+----------+--------+--------+  Subclavian Dist57        triphasic                   +---------------+----------+----------+--------+--------+  Axillary      106       triphasic                   +---------------+----------+----------+--------+--------+  Brachial Prox  107       triphasic                   +---------------+----------+----------+--------+--------+  Brachial Mid   109       triphasic                   +---------------+----------+----------+--------+--------+  Brachial Dist  86        triphasic                    +---------------+----------+----------+--------+--------+  Radial Prox    55        triphasic                   +---------------+----------+----------+--------+--------+  Radial Mid     37        triphasic                   +---------------+----------+----------+--------+--------+  Radial Dist    35        triphasic                   +---------------+----------+----------+--------+--------+  Ulnar Prox     54        triphasic                   +---------------+----------+----------+--------+--------+  Ulnar Mid      55        triphasic                   +---------------+----------+----------+--------+--------+  Ulnar Dist     46        biphasic                    +---------------+----------+----------+--------+--------+  Palmar Arch    40        monophasic        patent    +---------------+----------+----------+--------+--------+      Assessment/Plan:     [name] is a 47 y.o. female presenting for bilateral finger wounds and pain.  I explained that given her normal pulse exam as well as her noninvasive arterial studies of her arms and I am not concerned for any macrovascular issue.  She definitely has a microvascular problem with wounds at the tips of her fingers.  I explained that given the history of splinter hemorrhages under fingernails as well as pain episodes as a child that I am concerned for sickle cell or a rheumatologic cause.  We discussed the importance of abstinence from tobacco as nicotine can cause spasm which could worsen her symptoms I also explained I had like to start her on aspirin and statin until we find an underlying cause.  This note will be routed to her PCP Betha Loa at [facility]. I think she needs an expedited workup for sickle cell and to be referred to a rheumatologist.     Daria Pastures MD [facility]

## 2023-04-27 NOTE — Addendum Note (Signed)
Addended by: Carolynn Sayers on: 04/27/2023 04:24 PM   Modules accepted: Orders

## 2023-05-17 ENCOUNTER — Encounter (HOSPITAL_BASED_OUTPATIENT_CLINIC_OR_DEPARTMENT_OTHER): Payer: Self-pay | Admitting: Orthopedic Surgery

## 2023-05-17 ENCOUNTER — Encounter: Payer: MEDICAID | Admitting: Vascular Surgery

## 2023-05-17 ENCOUNTER — Other Ambulatory Visit: Payer: Self-pay | Admitting: Orthopedic Surgery

## 2023-05-17 ENCOUNTER — Other Ambulatory Visit: Payer: Self-pay

## 2023-05-17 NOTE — Progress Notes (Signed)
   05/17/23 1023  PAT Phone Screen  Is the patient taking a GLP-1 receptor agonist? No  Do You Have Diabetes? No  Do You Have Hypertension? Yes  Have You Ever Been to the ER for Asthma? No  Have You Taken Oral Steroids in the Past 3 Months? No  Do you Take Phenteramine or any Other Diet Drugs? No  Recent  Lab Work, EKG, CXR? Yes  Where was this test performed? (S)  [facility] 05/11/23 EKG 05/09/23  Do you have a history of heart problems? No  Cardiologist Name (S)  Consult w/ Cardiology Dr Fransisco Beau 05/09/23 HTN and to rule out vascular disease as cause for finger necrosis.  Any Recent Hospitalizations? (S)  No (IVC by son for ETOH use 9/18-9/22/24 . Pt states that she is currently abstaining from alcohol.)  Height 5\' 3"  (1.6 m)  Weight 51 kg  Pat Appointment Scheduled No  Reason for No Appointment Not Needed

## 2023-05-21 ENCOUNTER — Inpatient Hospital Stay: Payer: MEDICAID | Admitting: Oncology

## 2023-05-24 ENCOUNTER — Encounter (HOSPITAL_BASED_OUTPATIENT_CLINIC_OR_DEPARTMENT_OTHER)
Admission: RE | Disposition: A | Discharge status: Home / Self Care | Payer: Self-pay | Source: Home / Self Care | Attending: Orthopedic Surgery

## 2023-05-24 ENCOUNTER — Ambulatory Visit (HOSPITAL_BASED_OUTPATIENT_CLINIC_OR_DEPARTMENT_OTHER)
Admission: RE | Admit: 2023-05-24 | Discharge: 2023-05-24 | Disposition: A | Discharge status: Home / Self Care | Payer: MEDICAID | Attending: Orthopedic Surgery | Admitting: Orthopedic Surgery

## 2023-05-24 ENCOUNTER — Other Ambulatory Visit: Payer: Self-pay

## 2023-05-24 ENCOUNTER — Other Ambulatory Visit (HOSPITAL_COMMUNITY): Payer: Self-pay

## 2023-05-24 ENCOUNTER — Ambulatory Visit (HOSPITAL_BASED_OUTPATIENT_CLINIC_OR_DEPARTMENT_OTHER): Payer: MEDICAID | Admitting: Anesthesiology

## 2023-05-24 ENCOUNTER — Encounter (HOSPITAL_BASED_OUTPATIENT_CLINIC_OR_DEPARTMENT_OTHER): Payer: Self-pay | Admitting: Orthopedic Surgery

## 2023-05-24 DIAGNOSIS — L98498 Non-pressure chronic ulcer of skin of other sites with other specified severity: Secondary | ICD-10-CM | POA: Diagnosis present

## 2023-05-24 DIAGNOSIS — F17210 Nicotine dependence, cigarettes, uncomplicated: Secondary | ICD-10-CM | POA: Diagnosis not present

## 2023-05-24 DIAGNOSIS — I96 Gangrene, not elsewhere classified: Secondary | ICD-10-CM | POA: Diagnosis not present

## 2023-05-24 DIAGNOSIS — E039 Hypothyroidism, unspecified: Secondary | ICD-10-CM | POA: Diagnosis not present

## 2023-05-24 DIAGNOSIS — I10 Essential (primary) hypertension: Secondary | ICD-10-CM | POA: Insufficient documentation

## 2023-05-24 DIAGNOSIS — Z01818 Encounter for other preprocedural examination: Secondary | ICD-10-CM

## 2023-05-24 HISTORY — DX: Hypothyroidism, unspecified: E03.9

## 2023-05-24 HISTORY — DX: Essential (primary) hypertension: I10

## 2023-05-24 HISTORY — PX: AMPUTATION: SHX166

## 2023-05-24 LAB — POCT PREGNANCY, URINE: Preg Test, Ur: NEGATIVE

## 2023-05-24 SURGERY — AMPUTATION DIGIT
Anesthesia: General | Site: Finger | Laterality: Left

## 2023-05-24 MED ORDER — ONDANSETRON HCL 4 MG/2ML IJ SOLN
INTRAMUSCULAR | Status: DC | PRN
  Administered 2023-05-24: 4 mg via INTRAVENOUS

## 2023-05-24 MED ORDER — GABAPENTIN 300 MG PO CAPS
ORAL_CAPSULE | ORAL | Status: AC
  Filled 2023-05-24: qty 1

## 2023-05-24 MED ORDER — ACETAMINOPHEN 500 MG PO TABS
ORAL_TABLET | ORAL | Status: AC
  Filled 2023-05-24: qty 2

## 2023-05-24 MED ORDER — MIDAZOLAM HCL 2 MG/2ML IJ SOLN
INTRAMUSCULAR | Status: AC
  Filled 2023-05-24: qty 2

## 2023-05-24 MED ORDER — MEPERIDINE HCL 25 MG/ML IJ SOLN: 6.2500 mg | INTRAMUSCULAR | Status: DC | PRN

## 2023-05-24 MED ORDER — LIDOCAINE 2% (20 MG/ML) 5 ML SYRINGE
INTRAMUSCULAR | Status: AC
  Filled 2023-05-24: qty 5

## 2023-05-24 MED ORDER — HYDROCODONE-ACETAMINOPHEN 5-325 MG PO TABS
1.0000 | ORAL_TABLET | Freq: Four times a day (QID) | ORAL | 0 refills | Status: DC | PRN
  Filled 2023-05-24: qty 20, 3d supply, fill #0

## 2023-05-24 MED ORDER — KETOROLAC TROMETHAMINE 30 MG/ML IJ SOLN
INTRAMUSCULAR | Status: AC
  Filled 2023-05-24: qty 1

## 2023-05-24 MED ORDER — SODIUM CHLORIDE 0.9 % IV SOLN: INTRAVENOUS | Status: DC | PRN

## 2023-05-24 MED ORDER — FENTANYL CITRATE (PF) 100 MCG/2ML IJ SOLN
INTRAMUSCULAR | Status: AC
  Filled 2023-05-24: qty 2

## 2023-05-24 MED ORDER — 0.9 % SODIUM CHLORIDE (POUR BTL) OPTIME
TOPICAL | Status: DC | PRN
  Administered 2023-05-24: 100 mL

## 2023-05-24 MED ORDER — ONDANSETRON HCL 4 MG/2ML IJ SOLN
INTRAMUSCULAR | Status: AC
  Filled 2023-05-24: qty 2

## 2023-05-24 MED ORDER — ACETAMINOPHEN 500 MG PO TABS
1000.0000 mg | ORAL_TABLET | Freq: Once | ORAL | Status: AC
  Administered 2023-05-24: 1000 mg via ORAL

## 2023-05-24 MED ORDER — FENTANYL CITRATE (PF) 100 MCG/2ML IJ SOLN
INTRAMUSCULAR | Status: DC | PRN
  Administered 2023-05-24 (×2): 50 ug via INTRAVENOUS

## 2023-05-24 MED ORDER — PROPOFOL 10 MG/ML IV BOLUS
INTRAVENOUS | Status: AC
  Filled 2023-05-24: qty 20

## 2023-05-24 MED ORDER — DEXAMETHASONE SODIUM PHOSPHATE 4 MG/ML IJ SOLN
INTRAMUSCULAR | Status: DC | PRN
  Administered 2023-05-24: 5 mg via INTRAVENOUS

## 2023-05-24 MED ORDER — OXYCODONE HCL 5 MG/5ML PO SOLN: 5.0000 mg | Freq: Once | ORAL | Status: DC | PRN

## 2023-05-24 MED ORDER — DEXAMETHASONE SODIUM PHOSPHATE 10 MG/ML IJ SOLN
INTRAMUSCULAR | Status: AC
  Filled 2023-05-24: qty 1

## 2023-05-24 MED ORDER — FENTANYL CITRATE (PF) 100 MCG/2ML IJ SOLN: 25.0000 ug | INTRAMUSCULAR | Status: DC | PRN

## 2023-05-24 MED ORDER — CEFAZOLIN SODIUM-DEXTROSE 2-4 GM/100ML-% IV SOLN
2.0000 g | INTRAVENOUS | Status: AC
Start: 2023-05-24 — End: 2023-05-24
  Administered 2023-05-24: 2 g via INTRAVENOUS

## 2023-05-24 MED ORDER — CEFAZOLIN SODIUM-DEXTROSE 2-4 GM/100ML-% IV SOLN
INTRAVENOUS | Status: AC
  Filled 2023-05-24: qty 100

## 2023-05-24 MED ORDER — LACTATED RINGERS IV SOLN: INTRAVENOUS | Status: DC

## 2023-05-24 MED ORDER — PROPOFOL 10 MG/ML IV BOLUS
INTRAVENOUS | Status: DC | PRN
  Administered 2023-05-24: 200 mg via INTRAVENOUS

## 2023-05-24 MED ORDER — MIDAZOLAM HCL 5 MG/5ML IJ SOLN
INTRAMUSCULAR | Status: DC | PRN
  Administered 2023-05-24: 2 mg via INTRAVENOUS

## 2023-05-24 MED ORDER — BUPIVACAINE HCL (PF) 0.25 % IJ SOLN
INTRAMUSCULAR | Status: DC | PRN
  Administered 2023-05-24: 9 mL

## 2023-05-24 MED ORDER — OXYCODONE HCL 5 MG PO TABS: 5.0000 mg | ORAL_TABLET | Freq: Once | ORAL | Status: DC | PRN

## 2023-05-24 MED ORDER — DROPERIDOL 2.5 MG/ML IJ SOLN
0.6250 mg | Freq: Once | INTRAMUSCULAR | Status: DC | PRN
Start: 2023-05-24 — End: 2023-05-24

## 2023-05-24 MED ORDER — KETOROLAC TROMETHAMINE 30 MG/ML IJ SOLN
INTRAMUSCULAR | Status: DC | PRN
  Administered 2023-05-24: 30 mg via INTRAVENOUS

## 2023-05-24 MED ORDER — LIDOCAINE HCL (CARDIAC) PF 100 MG/5ML IV SOSY
PREFILLED_SYRINGE | INTRAVENOUS | Status: DC | PRN
  Administered 2023-05-24: 50 mg via INTRAVENOUS

## 2023-05-24 MED ORDER — GABAPENTIN 300 MG PO CAPS
300.0000 mg | ORAL_CAPSULE | Freq: Once | ORAL | Status: AC
Start: 2023-05-24 — End: 2023-05-24
  Administered 2023-05-24: 300 mg via ORAL

## 2023-05-24 SURGICAL SUPPLY — 45 items
BANDAGE GAUZE 1X75IN STRL (MISCELLANEOUS) IMPLANT
BLADE MINI RND TIP GREEN BEAV (BLADE) IMPLANT
BLADE SURG 15 STRL LF DISP TIS (BLADE) ×2 IMPLANT
BLADE SURG 15 STRL SS (BLADE) ×2
BNDG COHESIVE 1X5 TAN STRL LF (GAUZE/BANDAGES/DRESSINGS) IMPLANT
BNDG ELASTIC 2INX 5YD STR LF (GAUZE/BANDAGES/DRESSINGS) IMPLANT
BNDG ELASTIC 3INX 5YD STR LF (GAUZE/BANDAGES/DRESSINGS) IMPLANT
BNDG ESMARK 4X9 LF (GAUZE/BANDAGES/DRESSINGS) IMPLANT
BNDG GAUZE 1X75IN STRL (MISCELLANEOUS)
CHLORAPREP W/TINT 26 (MISCELLANEOUS) IMPLANT
CORD BIPOLAR FORCEPS 12FT (ELECTRODE) ×1 IMPLANT
COVER BACK TABLE 60X90IN (DRAPES) ×1 IMPLANT
COVER MAYO STAND STRL (DRAPES) ×1 IMPLANT
DRAIN PENROSE 12X.25 LTX STRL (MISCELLANEOUS) IMPLANT
DRAPE EXTREMITY T 121X128X90 (DISPOSABLE) ×1 IMPLANT
DRAPE OEC MINIVIEW 54X84 (DRAPES) IMPLANT
DRAPE SURG 17X23 STRL (DRAPES) ×1 IMPLANT
GAUZE SPONGE 4X4 12PLY STRL (GAUZE/BANDAGES/DRESSINGS) ×1 IMPLANT
GAUZE SPONGE 4X4 12PLY STRL LF (GAUZE/BANDAGES/DRESSINGS) IMPLANT
GAUZE XEROFORM 1X8 LF (GAUZE/BANDAGES/DRESSINGS) ×1 IMPLANT
GLOVE BIO SURGEON STRL SZ7.5 (GLOVE) ×1 IMPLANT
GLOVE BIOGEL PI IND STRL 8 (GLOVE) ×1 IMPLANT
GOWN STRL REUS W/ TWL LRG LVL3 (GOWN DISPOSABLE) ×1 IMPLANT
GOWN STRL REUS W/TWL LRG LVL3 (GOWN DISPOSABLE) ×1
GOWN STRL REUS W/TWL XL LVL3 (GOWN DISPOSABLE) ×1 IMPLANT
NDL HYPO 25X1 1.5 SAFETY (NEEDLE) IMPLANT
NEEDLE HYPO 25X1 1.5 SAFETY (NEEDLE) ×1
NS IRRIG 1000ML POUR BTL (IV SOLUTION) ×1 IMPLANT
PACK BASIN DAY SURGERY FS (CUSTOM PROCEDURE TRAY) ×1 IMPLANT
PADDING CAST ABS COTTON 4X4 ST (CAST SUPPLIES) ×1 IMPLANT
SPIKE FLUID TRANSFER (MISCELLANEOUS) IMPLANT
SPLINT FINGER 3.25 BULB 911905 (SOFTGOODS) IMPLANT
STOCKINETTE 4X48 STRL (DRAPES) ×1 IMPLANT
SUT CHROMIC 4 0 P 3 18 (SUTURE) IMPLANT
SUT ETHILON 3 0 PS 1 (SUTURE) IMPLANT
SUT ETHILON 4 0 PS 2 18 (SUTURE) IMPLANT
SUT MERSILENE 4 0 P 3 (SUTURE) IMPLANT
SUT MON AB 5-0 P3 18 (SUTURE) IMPLANT
SUT VIC AB 4-0 P-3 18XBRD (SUTURE) IMPLANT
SUT VIC AB 4-0 P3 18 (SUTURE)
SYR BULB EAR ULCER 3OZ GRN STR (SYRINGE) ×1 IMPLANT
SYR CONTROL 10ML LL (SYRINGE) IMPLANT
TOWEL GREEN STERILE FF (TOWEL DISPOSABLE) ×1 IMPLANT
TRAY DSU PREP LF (CUSTOM PROCEDURE TRAY) ×1 IMPLANT
UNDERPAD 30X36 HEAVY ABSORB (UNDERPADS AND DIAPERS) ×1 IMPLANT

## 2023-05-24 NOTE — H&P (Signed)
[name] is an 47 y.o. female.   Chief Complaint: fingertip necrosis HPI: 47 yo female with necrosis of the tip of the left small finger.  It has become progressively more black and necrotic.  It is painful.  She wishes to proceed with amputation of the distal aspect of the left small finger.  Allergies: No Known Allergies  Past Medical History:  Diagnosis Date   History of cervical dysplasia    20 yrs ago    Hypertension    Hypothyroidism    PTSD (post-traumatic stress disorder)    Sarcoma (HCC)     Past Surgical History:  Procedure Laterality Date   APPENDECTOMY     CESAREAN SECTION     LYMPH NODE BIOPSY      Family History: Family History  Problem Relation Age of Onset   Hypertension Mother    Breast cancer Mother    Diabetes Father    Hypertension Father    Breast cancer Paternal Grandmother     Social History:   reports that she has been smoking cigarettes. She has never used smokeless tobacco. She reports that she does not currently use alcohol. She reports that she does not use drugs.  Medications: Medications Prior to Admission  Medication Sig Dispense Refill   amLODipine (NORVASC) 2.5 MG tablet Take 2.5 mg by mouth daily.     aspirin EC 81 MG tablet Take 1 tablet (81 mg total) by mouth daily. Swallow whole. 30 tablet 12   atorvastatin (LIPITOR) 20 MG tablet Take 1 tablet (20 mg total) by mouth daily. 30 tablet 2   meloxicam (MOBIC) 15 MG tablet Take 15 mg by mouth daily.     naproxen (NAPROSYN) 500 MG tablet Take 500 mg by mouth 2 (two) times daily with a meal.     traMADol (ULTRAM) 50 MG tablet Take by mouth every 6 (six) hours as needed.     traZODone (DESYREL) 50 MG tablet Take 1 tablet (50 mg total) by mouth at bedtime as needed for sleep. 30 tablet 0    No results found for this or any previous visit (from the past 48 hour(s)).  No results found.    Height 5\' 3"  (1.6 m), weight 51 kg.  General appearance: alert, cooperative, and appears  stated age Head: Normocephalic, without obvious abnormality, atraumatic Neck: supple, symmetrical, trachea midline Extremities: Intact sensation and capillary refill all digits with the exception of left small fingertip which is necrotic.  +epl/fpl/io.   Pulses: 2+ and symmetric Skin: Skin color, texture, turgor normal. No rashes or lesions Neurologic: Grossly normal Incision/Wound: as above  Assessment/Plan Left small fingertip necrosis.  Non operative and operative treatment options have been discussed with the patient and patient wishes to proceed with operative treatment. Risks, benefits and alternatives of surgery were discussed including risks of blood loss, infection, damage to nerves/vessels/tendons/ligament/bone, failure of surgery, need for additional surgery, complication with wound healing, stiffness, need for further amputation.  She voiced understanding of these risks and elected to proceed.    Betha Loa 05/24/2023, 11:34 AM

## 2023-05-24 NOTE — Anesthesia Procedure Notes (Signed)
Procedure Name: LMA Insertion Date/Time: 05/24/2023 1:14 PM  Performed by: Cleda Clarks, CRNAPre-anesthesia Checklist: Patient identified, Emergency Drugs available, Suction available and Patient being monitored Patient Re-evaluated:Patient Re-evaluated prior to induction Oxygen Delivery Method: Circle system utilized Preoxygenation: Pre-oxygenation with 100% oxygen Induction Type: IV induction Ventilation: Mask ventilation without difficulty LMA: LMA inserted LMA Size: 4.0 Number of attempts: 1 Airway Equipment and Method: Bite block Placement Confirmation: positive ETCO2 Tube secured with: Tape Dental Injury: Teeth and Oropharynx as per pre-operative assessment

## 2023-05-24 NOTE — Op Note (Signed)
NAME: [name] MEDICAL RECORD NO: [id number] DATE OF BIRTH: July 26, 1975 FACILITY: [facility] LOCATION: [facility] PHYSICIAN: Tami Ribas, MD   OPERATIVE REPORT   DATE OF PROCEDURE: 05/24/23    PREOPERATIVE DIAGNOSIS: Left small finger necrosis   POSTOPERATIVE DIAGNOSIS: Left small finger necrosis   PROCEDURE: Amputation left small finger through middle phalanx   SURGEON:  Betha Loa, M.D.   ASSISTANT: none   ANESTHESIA:  General   INTRAVENOUS FLUIDS:  Per anesthesia flow sheet.   ESTIMATED BLOOD LOSS:  Minimal.   COMPLICATIONS:  None.   SPECIMENS: Left small finger to pathology for gross exam   TOURNIQUET TIME:    Total Tourniquet Time Documented: Forearm (Left) - 18 minutes Total: Forearm (Left) - 18 minutes    DISPOSITION:  Stable to PACU.   INDICATIONS: 47 year old female with necrosis of distal aspect of left small finger.  She wishes to proceed with amputation of the small finger to achieve healing.  Risks, benefits and alternatives of surgery were discussed including the risks of blood loss, infection, damage to nerves, vessels, tendons, ligaments, bone for surgery, need for additional surgery, complications with wound healing, continued pain, stiffness, need for further amputation.  She voiced understanding of these risks and elected to proceed.  OPERATIVE COURSE:  After being identified preoperatively by myself,  the patient and I agreed on the procedure and site of the procedure.  The surgical site was marked.  Surgical consent had been signed. Preoperative IV antibiotic prophylaxis was given. She was transferred to the operating room and placed on the operating table in supine position with the Left upper extremity on an arm board.  General anesthesia was induced by the anesthesiologist.  Left upper extremity was prepped and draped in normal sterile orthopedic fashion.  A surgical pause was performed between the surgeons, anesthesia, and  operating room staff and all were in agreement as to the patient, procedure, and site of procedure.  Tourniquet at the proximal aspect of the forearm was inflated to 250 mmHg after exsanguination of the arm with an Esmarch bandage.  A fishmouth shaped incision was made preserving as much viable skin that appeared to have healing potential as possible.  This was carried in subcutaneous tissues by spreading technique.  The radial and ulnar digital nerve and artery were identified.  The nerves were placed under traction bipolar and allowed to retract.  The digital arteries were diminutive.  These were bipolared.  The flexor and extensor tendons were sharply incised.  The finger was amputated through the DIP joint.  The amputated portion was sent to pathology for gross exam only.  Rongeurs were used to shorten the condyles of the middle phalanx to the point that the skin could easily close over the end.  The bone edges were rounded.  Once the skin edges could be easily reapproximated without tension the wound was copiously irrigated with sterile saline.  The skin edges were reapproximated with 5-0 Monocryl suture in an interrupted fashion.  Good tension-free apposition was obtained.  Digital block was performed with quarter percent plain Marcaine.  The wound was dressed with sterile Xeroform and 4 x 4 and wrapped with a Coban dressing lightly.  An AlumaFoam splint was placed and wrapped lightly with Coban dressing.  The tourniquet was deflated at 18 minutes.  Fingertips were pink with brisk capillary refill after deflation of tourniquet.  The operative  drapes were broken down.  The patient was awoken from anesthesia safely.  She  was transferred back to the stretcher and taken to PACU in stable condition.  I will see her back in the office in 1 week for postoperative followup.  I will give her a prescription for Norco 5/325 1-2 tabs PO q6 hours prn pain, dispense # 20.   Betha Loa, MD Electronically signed,  05/24/23

## 2023-05-24 NOTE — Anesthesia Preprocedure Evaluation (Addendum)
Anesthesia Evaluation  Patient identified by MRN, date of birth, ID band Patient awake    Reviewed: Allergy & Precautions, NPO status , Patient's Chart, lab work & pertinent test results  Airway Mallampati: II  TM Distance: >3 FB Neck ROM: Full    Dental  (+) Dental Advisory Given, Missing   Pulmonary Current Smoker and Patient abstained from smoking.   Pulmonary exam normal breath sounds clear to auscultation       Cardiovascular hypertension, Pt. on medications Normal cardiovascular exam Rhythm:Regular Rate:Normal     Neuro/Psych  PSYCHIATRIC DISORDERS Anxiety     negative neurological ROS     GI/Hepatic negative GI ROS,,,(+)     substance abuse  alcohol use  Endo/Other  Hypothyroidism    Renal/GU negative Renal ROS     Musculoskeletal negative musculoskeletal ROS (+)    Abdominal   Peds  Hematology negative hematology ROS (+)   Anesthesia Other Findings   Reproductive/Obstetrics                             Anesthesia Physical Anesthesia Plan  ASA: 2  Anesthesia Plan: General   Post-op Pain Management: Tylenol PO (pre-op)* and Gabapentin PO (pre-op)*   Induction: Intravenous  PONV Risk Score and Plan: 3 and Ondansetron, Dexamethasone, Treatment may vary due to age or medical condition and Midazolam  Airway Management Planned:   Additional Equipment: None  Intra-op Plan:   Post-operative Plan: Extubation in OR  Informed Consent: I have reviewed the patients History and Physical, chart, labs and discussed the procedure including the risks, benefits and alternatives for the proposed anesthesia with the patient or authorized representative who has indicated his/her understanding and acceptance.     Dental advisory given  Plan Discussed with: CRNA  Anesthesia Plan Comments:         Anesthesia Quick Evaluation

## 2023-05-24 NOTE — Transfer of Care (Signed)
Immediate Anesthesia Transfer of Care Note  Patient: Padme A Ancrum  Procedure(s) Performed: revision amputation left small finger (Left: Finger)  Patient Location: PACU  Anesthesia Type:General  Level of Consciousness: awake, alert , and oriented  Airway & Oxygen Therapy: Patient Spontanous Breathing and Patient connected to face mask oxygen  Post-op Assessment: Report given to RN and Post -op Vital signs reviewed and stable  Post vital signs: Reviewed and stable  Last Vitals:  Vitals Value Taken Time  BP    Temp    Pulse 54 05/24/23 1355  Resp 15 05/24/23 1355  SpO2 100 % 05/24/23 1355  Vitals shown include unfiled device data.  Last Pain:  Vitals:   05/24/23 1138  TempSrc: Temporal  PainSc: 9       Patients Stated Pain Goal: 5 (05/24/23 1138)  Complications: No notable events documented.

## 2023-05-24 NOTE — Anesthesia Postprocedure Evaluation (Signed)
Anesthesia Post Note  Patient: [name]  Procedure(s) Performed: revision amputation left small finger (Left: Finger)     Patient location during evaluation: PACU Anesthesia Type: General Level of consciousness: sedated and patient cooperative Pain management: pain level controlled Vital Signs Assessment: post-procedure vital signs reviewed and stable Respiratory status: spontaneous breathing Cardiovascular status: stable Anesthetic complications: no   No notable events documented.  Last Vitals:  Vitals:   05/24/23 1415 05/24/23 1438  BP: (!) 138/91 (!) 140/88  Pulse:  (!) 58  Resp:    Temp:  (!) 36.3 C  SpO2:  99%    Last Pain:  Vitals:   05/24/23 1438  TempSrc:   PainSc: 0-No pain                 Lewie Loron

## 2023-05-24 NOTE — Discharge Instructions (Addendum)

## 2023-05-25 ENCOUNTER — Encounter (HOSPITAL_BASED_OUTPATIENT_CLINIC_OR_DEPARTMENT_OTHER): Payer: Self-pay | Admitting: Orthopedic Surgery

## 2023-05-25 ENCOUNTER — Other Ambulatory Visit (HOSPITAL_COMMUNITY): Payer: Self-pay

## 2023-05-28 LAB — SURGICAL PATHOLOGY

## 2023-06-04 ENCOUNTER — Inpatient Hospital Stay: Payer: MEDICAID | Admitting: Oncology

## 2023-06-04 NOTE — Progress Notes (Deleted)
[facility]  HEMATOLOGY CLINIC CONSULTATION NOTE    PATIENT NAME: [name]   MR#: [id number] DOB: 11-May-1976  DATE OF SERVICE: 06/04/2023   REFERRING PHYSICIAN  Carolynn Sayers, MD, Vascular Surgery  Patient Care Team: Larkin Ina as PCP - General (Physician Assistant) Daria Pastures, MD as Consulting Physician (Vascular Surgery)   REASON FOR CONSULTATION/ CHIEF COMPLAINT:  Evaluate for possible sickle cell anemia  HISTORY OF PRESENT ILLNESS:  [name] is a 47 y.o. lady with a past medical history of alcohol abuse, pancreatitis, nicotine dependence, hypertension, hypothyroidism, PTSD, chronic bilateral hand pain, Raynaud's disease with gangrene, was referred to our service for evaluation of possible underlying sickle cell disease.    Discussed the use of AI scribe software for clinical note transcription with the patient, who gave verbal consent to proceed.   She has had several year history of painful fingertips, digital ulcers, clubbing and history consistent with Raynaud's disease.  She was recently seen by vascular surgeon Dr. Hetty Blend for evaluation of this issue.  She had a normal pulse exam as well as noninvasive arterial studies of her arms.  Hence macrovascular problem was not felt to be likely and it was felt that she may have microvascular problem and hence she was referred to rheumatology.  She was also referred to hematology service to rule out any underlying sickle cell disease.  She denies fever, cough, diarrhea, or other infectious symptoms.  She denies epistaxis, bloody stool, melena, hematuria, bruising or other bleeding symptoms. She also denies unintentional weight loss, night sweats or other constitutional symptoms.  MEDICAL HISTORY Past Medical History:  Diagnosis Date   History of cervical dysplasia    20 yrs ago    Hypertension    Hypothyroidism    PTSD (post-traumatic stress disorder)    Sarcoma (HCC)       SURGICAL HISTORY Past Surgical History:  Procedure Laterality Date   AMPUTATION Left 05/24/2023   Procedure: revision amputation left small finger;  Surgeon: Betha Loa, MD;  Location: [facility];  Service: Orthopedics;  Laterality: Left;  time need 45 minutes   APPENDECTOMY     CESAREAN SECTION     LYMPH NODE BIOPSY       SOCIAL HISTORY: She reports that she has been smoking cigarettes. She has never used smokeless tobacco. She reports that she does not currently use alcohol. She reports that she does not use drugs. Social History   Socioeconomic History   Marital status: Single    Spouse name: Not on file   Number of children: Not on file   Years of education: Not on file   Highest education level: Not on file  Occupational History   Not on file  Tobacco Use   Smoking status: Some Days    Types: Cigarettes   Smokeless tobacco: Never  Vaping Use   Vaping status: Never Used  Substance and Sexual Activity   Alcohol use: Not Currently   Drug use: No   Sexual activity: Yes    Birth control/protection: Implant  Other Topics Concern   Not on file  Social History Narrative   Not on file   Social Determinants of Health   Financial Resource Strain: Medium Risk (02/06/2023)   Received from [facility]   Overall Financial Resource Strain (CARDIA)    Difficulty of Paying Living Expenses: Somewhat hard  Food Insecurity: No Food Insecurity (03/29/2023)   Hunger Vital Sign  Worried About Programme researcher, broadcasting/film/video in the Last Year: Never true    Ran Out of Food in the Last Year: Never true  Transportation Needs: No Transportation Needs (03/29/2023)   PRAPARE - Administrator, Civil Service (Medical): No    Lack of Transportation (Non-Medical): No  Physical Activity: Sufficiently Active (02/06/2023)   Received from [facility]   Exercise Vital Sign    Days of Exercise per Week: 5 days    Minutes of Exercise per Session: 30 min  Stress: Stress  Concern Present (02/06/2023)   Received from [facility] of Occupational Health - Occupational Stress Questionnaire    Feeling of Stress : To some extent  Social Connections: Socially Integrated (02/06/2023)   Received from [facility]   Social Network    How would you rate your social network (family, work, friends)?: Good participation with social networks  Intimate Partner Violence: Not At Risk (03/29/2023)   Humiliation, Afraid, Rape, and Kick questionnaire    Fear of Current or Ex-Partner: No    Emotionally Abused: No    Physically Abused: No    Sexually Abused: No    FAMILY HISTORY: Her family history includes Breast cancer in her mother and paternal grandmother; Diabetes in her father; Hypertension in her father and mother.  CURRENT MEDICATIONS   Current Outpatient Medications  Medication Instructions   amLODipine (NORVASC) 2.5 mg, Oral, Daily   aspirin EC 81 mg, Oral, Daily, Swallow whole.   atorvastatin (LIPITOR) 20 mg, Oral, Daily   HYDROcodone-acetaminophen (NORCO) 5-325 MG tablet Take 1-2 tablets by mouth every 6 (six) hours as needed for pain.   meloxicam (MOBIC) 15 mg, Oral, Daily   traMADol (ULTRAM) 50 MG tablet Oral, Every 6 hours PRN   traZODone (DESYREL) 50 mg, Oral, At bedtime PRN     ALLERGIES  She has No Known Allergies.  REVIEW OF SYSTEMS:  Review of Systems - Oncology   Rest of the pertinent review of systems is unremarkable except as mentioned above in HPI.  PHYSICAL EXAMINATION:  ECOG PERFORMANCE STATUS: {CHL ONC ECOG PS:[id number]}  There were no vitals filed for this visit. There were no vitals filed for this visit.  Physical Exam  ***  LABORATORY DATA:   I have reviewed the data as listed.  No results found for any visits on 06/04/23.  Lab Results  Component Value Date   WBC 3.9 (L) 03/28/2023   NEUTROABS 1.8 03/28/2023   HGB 12.1 03/28/2023   HCT 36.3 03/28/2023   MCV 91.4 03/28/2023   PLT 181 03/28/2023     No results found for: "IRON", "TIBC", "FERRITIN"   Recent Labs    02/26/23 0454 02/26/23 0837 03/01/23 0445 03/28/23 2202  NA 141  --  137 143  K 3.5  --  3.6 3.2*  CL 99  --  97* 101  CO2 23  --  23 25  GLUCOSE 144*  --  100* 102*  BUN 10  --  10 12  CREATININE 0.95  --  0.80 0.69  CALCIUM 9.5  --  9.4 9.1  GFRNONAA >60  --  >60 >60  PROT  --  8.2* 7.8 7.2  ALBUMIN  --  4.3 4.4 4.1  AST  --  69* 61* 40  ALT  --  30 27 15   ALKPHOS  --  61 62 59  BILITOT  --  1.0 2.6* 1.0  BILIDIR  --  0.1  --   --  IBILI  --  0.9  --   --     No results found for this or any previous visit (from the past 72 hour(s)).  RADIOGRAPHIC STUDIES:  I have personally reviewed the radiological images as listed and agreed with the findings in the report.  No results found.  ASSESSMENT & PLAN:  No problem-specific Assessment & Plan notes found for this encounter.   We will check a CBC with differential today.  We will also send a work-up for leukocytosis, including ESR, CRP, BCR /ABL, peripheral blood flow cytometry.  We will send the work-up for anemia, including ferritin, iron studies, vitamin B12 and folate level, MMA, SPEP and IFE, haptoglobin, reticulocyte count,  I have counseled patient on smoking cessation, and offered nicotine patch, patient states that sheis going to try to quit.  Low-dose CT scan for screening of lung cancer is indicated ***  Patient will come back in *** weeks to review the test results and discuss further plan of care.  No orders of the defined types were placed in this encounter.   The total time spent in the appointment was {CHL ONC TIME VISIT - [id number]} encounter with the patient, including review of chart and results of various tests, discussion about plan of care and coordination of care plan.  I reviewed lab results and outside records for this visit and discussed relevant results with the patient. Diagnosis, plan of care and treatment  options were also discussed in detail with the patient. Opportunity provided to ask questions and answers provided to her apparent satisfaction. Provided instructions to call our clinic with any problems, questions or concerns prior to return visit. I recommended to continue follow-up with PCP and sub-specialists. She verbalized understanding and agreed with the plan. No barriers to learning was detected.   Future Appointments  Date Time Provider Department Center  06/04/2023  9:00 AM Riggs Dineen, Archie Patten, MD [facility] None     Meryl Crutch, MD  06/04/2023 8:45 AM  [facility]  [facility] CENTER - A DEPT OF [facility] [address] [place] [address] Dept: [phone] Dept Fax: [phone]    This document was completed utilizing speech recognition software. Grammatical errors, random word insertions, pronoun errors, and incomplete sentences are an occasional consequence of this system due to software limitations, ambient noise, and hardware issues. Any formal questions or concerns about the content, text or information contained within the body of this dictation should be directly addressed to the provider for clarification.

## 2023-06-14 ENCOUNTER — Encounter (HOSPITAL_COMMUNITY): Payer: Self-pay

## 2023-06-14 ENCOUNTER — Emergency Department (HOSPITAL_COMMUNITY)
Admission: EM | Admit: 2023-06-14 | Discharge: 2023-06-14 | Discharge status: Against Medical Advice | Payer: MEDICAID | Attending: Emergency Medicine | Admitting: Emergency Medicine

## 2023-06-14 ENCOUNTER — Other Ambulatory Visit: Payer: Self-pay

## 2023-06-14 DIAGNOSIS — Z5321 Procedure and treatment not carried out due to patient leaving prior to being seen by health care provider: Secondary | ICD-10-CM | POA: Diagnosis not present

## 2023-06-14 DIAGNOSIS — Z1152 Encounter for screening for COVID-19: Secondary | ICD-10-CM | POA: Diagnosis not present

## 2023-06-14 DIAGNOSIS — R5381 Other malaise: Secondary | ICD-10-CM | POA: Insufficient documentation

## 2023-06-14 DIAGNOSIS — R5383 Other fatigue: Secondary | ICD-10-CM | POA: Insufficient documentation

## 2023-06-14 DIAGNOSIS — M791 Myalgia, unspecified site: Secondary | ICD-10-CM | POA: Diagnosis present

## 2023-06-14 LAB — CBC WITH DIFFERENTIAL/PLATELET
Abs Immature Granulocytes: 0.02 10*3/uL (ref 0.00–0.07)
Basophils Absolute: 0.1 10*3/uL (ref 0.0–0.1)
Basophils Relative: 1 %
Eosinophils Absolute: 0.1 10*3/uL (ref 0.0–0.5)
Eosinophils Relative: 2 %
HCT: 35.9 % — ABNORMAL LOW (ref 36.0–46.0)
Hemoglobin: 11.8 g/dL — ABNORMAL LOW (ref 12.0–15.0)
Immature Granulocytes: 0 %
Lymphocytes Relative: 25 %
Lymphs Abs: 1.6 10*3/uL (ref 0.7–4.0)
MCH: 29.7 pg (ref 26.0–34.0)
MCHC: 32.9 g/dL (ref 30.0–36.0)
MCV: 90.4 fL (ref 80.0–100.0)
Monocytes Absolute: 0.7 10*3/uL (ref 0.1–1.0)
Monocytes Relative: 11 %
Neutro Abs: 3.8 10*3/uL (ref 1.7–7.7)
Neutrophils Relative %: 61 %
Platelets: 219 10*3/uL (ref 150–400)
RBC: 3.97 MIL/uL (ref 3.87–5.11)
RDW: 13.2 % (ref 11.5–15.5)
WBC: 6.2 10*3/uL (ref 4.0–10.5)
nRBC: 0 % (ref 0.0–0.2)

## 2023-06-14 LAB — BASIC METABOLIC PANEL
Anion gap: 13 (ref 5–15)
BUN: 10 mg/dL (ref 6–20)
CO2: 25 mmol/L (ref 22–32)
Calcium: 9.2 mg/dL (ref 8.9–10.3)
Chloride: 106 mmol/L (ref 98–111)
Creatinine, Ser: 1.27 mg/dL — ABNORMAL HIGH (ref 0.44–1.00)
GFR, Estimated: 53 mL/min — ABNORMAL LOW (ref >60)
Glucose, Bld: 114 mg/dL — ABNORMAL HIGH (ref 70–99)
Potassium: 3.4 mmol/L — ABNORMAL LOW (ref 3.5–5.1)
Sodium: 144 mmol/L (ref 135–145)

## 2023-06-14 LAB — RESP PANEL BY RT-PCR (RSV, FLU A&B, COVID)  RVPGX2
Influenza A by PCR: NEGATIVE
Influenza B by PCR: NEGATIVE
Resp Syncytial Virus by PCR: NEGATIVE
SARS Coronavirus 2 by RT PCR: NEGATIVE

## 2023-06-14 NOTE — ED Provider Triage Note (Cosign Needed)
Emergency Medicine Provider Triage Evaluation Note  [name] , a 47 y.o. female  was evaluated in triage.  Pt complains of pain all over.  Patient states that she does suffer from chronic pain secondary to her Raynaud's disease.  She does state that she has been feeling very fatigued and experiencing general malaise.  Review of Systems  Positive:  Negative: See above   Physical Exam  BP 120/86 (BP Location: Right Arm)   Pulse (!) 107   Temp 98.6 F (37 C)   Resp 18   Ht 5\' 3"  (1.6 m)   Wt 43.1 kg   SpO2 96%   BMI 16.83 kg/m  Gen:   Awake, no distress   Resp:  Normal effort  MSK:   Moves extremities without difficulty  Other:    Medical Decision Making  Medically screening exam initiated at 3:41 PM.  Appropriate orders placed.  [name] was informed that the remainder of the evaluation will be completed by another provider, this initial triage assessment does not replace that evaluation, and the importance of remaining in the ED until their evaluation is complete.     Honor Loh [place], [place] 06/14/23 [phone]

## 2023-06-14 NOTE — ED Notes (Signed)
Patient left.

## 2023-06-14 NOTE — ED Triage Notes (Addendum)
Pt came in d/t pain she has been having "all over" today. Denies any recent falls/injuries. A/Ox4, rates her pain 10/10 while in triage.

## 2023-06-25 ENCOUNTER — Inpatient Hospital Stay: Payer: MEDICAID | Attending: Oncology | Admitting: Oncology

## 2023-06-25 ENCOUNTER — Telehealth: Payer: Self-pay | Admitting: *Deleted

## 2023-06-25 NOTE — Telephone Encounter (Signed)
Patient called to report she thinks she missed an appointment today. Confirmed w/her that she did and she will be contacted to reschedule by our new patient coordinator.

## 2023-07-02 ENCOUNTER — Other Ambulatory Visit: Payer: Self-pay | Admitting: Orthopedic Surgery

## 2023-07-09 ENCOUNTER — Encounter (HOSPITAL_BASED_OUTPATIENT_CLINIC_OR_DEPARTMENT_OTHER): Payer: Self-pay | Admitting: Orthopedic Surgery

## 2023-07-16 ENCOUNTER — Other Ambulatory Visit: Payer: Self-pay

## 2023-07-16 ENCOUNTER — Encounter (HOSPITAL_BASED_OUTPATIENT_CLINIC_OR_DEPARTMENT_OTHER)
Admission: RE | Disposition: A | Discharge status: Home / Self Care | Payer: Self-pay | Source: Home / Self Care | Attending: Orthopedic Surgery

## 2023-07-16 ENCOUNTER — Ambulatory Visit (HOSPITAL_BASED_OUTPATIENT_CLINIC_OR_DEPARTMENT_OTHER): Payer: MEDICAID | Admitting: Anesthesiology

## 2023-07-16 ENCOUNTER — Encounter (HOSPITAL_BASED_OUTPATIENT_CLINIC_OR_DEPARTMENT_OTHER): Payer: Self-pay | Admitting: Orthopedic Surgery

## 2023-07-16 ENCOUNTER — Ambulatory Visit (HOSPITAL_BASED_OUTPATIENT_CLINIC_OR_DEPARTMENT_OTHER)
Admission: RE | Admit: 2023-07-16 | Discharge: 2023-07-16 | Disposition: A | Discharge status: Home / Self Care | Payer: MEDICAID | Attending: Orthopedic Surgery | Admitting: Orthopedic Surgery

## 2023-07-16 DIAGNOSIS — Z89022 Acquired absence of left finger(s): Secondary | ICD-10-CM | POA: Diagnosis not present

## 2023-07-16 DIAGNOSIS — I7301 Raynaud's syndrome with gangrene: Secondary | ICD-10-CM | POA: Diagnosis not present

## 2023-07-16 DIAGNOSIS — X58XXXA Exposure to other specified factors, initial encounter: Secondary | ICD-10-CM | POA: Insufficient documentation

## 2023-07-16 DIAGNOSIS — I10 Essential (primary) hypertension: Secondary | ICD-10-CM | POA: Insufficient documentation

## 2023-07-16 DIAGNOSIS — F419 Anxiety disorder, unspecified: Secondary | ICD-10-CM | POA: Diagnosis not present

## 2023-07-16 DIAGNOSIS — S61200A Unspecified open wound of right index finger without damage to nail, initial encounter: Secondary | ICD-10-CM | POA: Insufficient documentation

## 2023-07-16 DIAGNOSIS — I96 Gangrene, not elsewhere classified: Secondary | ICD-10-CM | POA: Diagnosis not present

## 2023-07-16 DIAGNOSIS — Z87891 Personal history of nicotine dependence: Secondary | ICD-10-CM

## 2023-07-16 DIAGNOSIS — S61002A Unspecified open wound of left thumb without damage to nail, initial encounter: Secondary | ICD-10-CM | POA: Insufficient documentation

## 2023-07-16 DIAGNOSIS — Z01818 Encounter for other preprocedural examination: Secondary | ICD-10-CM

## 2023-07-16 DIAGNOSIS — S61204A Unspecified open wound of right ring finger without damage to nail, initial encounter: Secondary | ICD-10-CM | POA: Diagnosis not present

## 2023-07-16 HISTORY — PX: AMPUTATION: SHX166

## 2023-07-16 HISTORY — DX: Raynaud's syndrome without gangrene: I73.00

## 2023-07-16 HISTORY — PX: INCISION AND DRAINAGE: SHX5863

## 2023-07-16 HISTORY — DX: Anxiety disorder, unspecified: F41.9

## 2023-07-16 SURGERY — AMPUTATION DIGIT
Anesthesia: General | Site: Hand | Laterality: Bilateral

## 2023-07-16 MED ORDER — CEFAZOLIN SODIUM-DEXTROSE 2-4 GM/100ML-% IV SOLN
2.0000 g | INTRAVENOUS | Status: AC
  Administered 2023-07-16: 2 g via INTRAVENOUS

## 2023-07-16 MED ORDER — LIDOCAINE 2% (20 MG/ML) 5 ML SYRINGE
INTRAMUSCULAR | Status: AC
  Filled 2023-07-16: qty 5

## 2023-07-16 MED ORDER — OXYCODONE HCL 5 MG PO TABS
ORAL_TABLET | ORAL | Status: AC
  Filled 2023-07-16: qty 1

## 2023-07-16 MED ORDER — ONDANSETRON HCL 4 MG/2ML IJ SOLN
INTRAMUSCULAR | Status: AC
  Filled 2023-07-16: qty 2

## 2023-07-16 MED ORDER — SODIUM CHLORIDE 0.9 % IV SOLN: INTRAVENOUS | Status: DC | PRN

## 2023-07-16 MED ORDER — PROPOFOL 10 MG/ML IV BOLUS
INTRAVENOUS | Status: DC | PRN
  Administered 2023-07-16: 150 mg via INTRAVENOUS

## 2023-07-16 MED ORDER — MIDAZOLAM HCL 2 MG/2ML IJ SOLN
INTRAMUSCULAR | Status: AC
Start: 2023-07-16 — End: ?
  Filled 2023-07-16: qty 2

## 2023-07-16 MED ORDER — CEFAZOLIN SODIUM-DEXTROSE 2-4 GM/100ML-% IV SOLN
INTRAVENOUS | Status: AC
  Filled 2023-07-16: qty 100

## 2023-07-16 MED ORDER — DEXAMETHASONE SODIUM PHOSPHATE 10 MG/ML IJ SOLN
INTRAMUSCULAR | Status: AC
  Filled 2023-07-16: qty 1

## 2023-07-16 MED ORDER — LACTATED RINGERS IV SOLN: INTRAVENOUS | Status: DC

## 2023-07-16 MED ORDER — DEXAMETHASONE SODIUM PHOSPHATE 10 MG/ML IJ SOLN
INTRAMUSCULAR | Status: DC | PRN
  Administered 2023-07-16: 10 mg via INTRAVENOUS

## 2023-07-16 MED ORDER — OXYCODONE HCL 5 MG PO TABS: 5.0000 mg | ORAL_TABLET | Freq: Four times a day (QID) | ORAL | 0 refills | Status: DC | PRN

## 2023-07-16 MED ORDER — AMISULPRIDE (ANTIEMETIC) 5 MG/2ML IV SOLN
10.0000 mg | Freq: Once | INTRAVENOUS | Status: AC | PRN
  Administered 2023-07-16: 10 mg via INTRAVENOUS

## 2023-07-16 MED ORDER — OXYCODONE HCL 5 MG PO TABS
5.0000 mg | ORAL_TABLET | Freq: Once | ORAL | Status: AC | PRN
  Administered 2023-07-16: 5 mg via ORAL

## 2023-07-16 MED ORDER — FENTANYL CITRATE (PF) 100 MCG/2ML IJ SOLN
INTRAMUSCULAR | Status: DC | PRN
  Administered 2023-07-16: 50 ug via INTRAVENOUS

## 2023-07-16 MED ORDER — FENTANYL CITRATE (PF) 100 MCG/2ML IJ SOLN
25.0000 ug | INTRAMUSCULAR | Status: DC | PRN
  Administered 2023-07-16: 50 ug via INTRAVENOUS

## 2023-07-16 MED ORDER — AMISULPRIDE (ANTIEMETIC) 5 MG/2ML IV SOLN
INTRAVENOUS | Status: AC
  Filled 2023-07-16: qty 4

## 2023-07-16 MED ORDER — ACETAMINOPHEN 500 MG PO TABS
ORAL_TABLET | ORAL | Status: AC
  Filled 2023-07-16: qty 2

## 2023-07-16 MED ORDER — LIDOCAINE 2% (20 MG/ML) 5 ML SYRINGE
INTRAMUSCULAR | Status: DC | PRN
  Administered 2023-07-16: 60 mg via INTRAVENOUS

## 2023-07-16 MED ORDER — BUPIVACAINE HCL (PF) 0.25 % IJ SOLN
INTRAMUSCULAR | Status: DC | PRN
  Administered 2023-07-16: 18 mL

## 2023-07-16 MED ORDER — PROPOFOL 10 MG/ML IV BOLUS
INTRAVENOUS | Status: AC
Start: 2023-07-16 — End: ?
  Filled 2023-07-16: qty 20

## 2023-07-16 MED ORDER — PHENYLEPHRINE 80 MCG/ML (10ML) SYRINGE FOR IV PUSH (FOR BLOOD PRESSURE SUPPORT)
PREFILLED_SYRINGE | INTRAVENOUS | Status: DC | PRN
  Administered 2023-07-16 (×3): 120 ug via INTRAVENOUS
  Administered 2023-07-16: 80 ug via INTRAVENOUS
  Administered 2023-07-16: 160 ug via INTRAVENOUS
  Administered 2023-07-16: 120 ug via INTRAVENOUS

## 2023-07-16 MED ORDER — ACETAMINOPHEN 500 MG PO TABS
1000.0000 mg | ORAL_TABLET | Freq: Once | ORAL | Status: AC
  Administered 2023-07-16: 1000 mg via ORAL

## 2023-07-16 MED ORDER — FENTANYL CITRATE (PF) 100 MCG/2ML IJ SOLN
INTRAMUSCULAR | Status: AC
  Filled 2023-07-16: qty 2

## 2023-07-16 MED ORDER — MIDAZOLAM HCL 5 MG/5ML IJ SOLN
INTRAMUSCULAR | Status: DC | PRN
  Administered 2023-07-16: 2 mg via INTRAVENOUS

## 2023-07-16 MED ORDER — ONDANSETRON HCL 4 MG/2ML IJ SOLN
INTRAMUSCULAR | Status: DC | PRN
  Administered 2023-07-16: 4 mg via INTRAVENOUS

## 2023-07-16 MED ORDER — PHENYLEPHRINE 80 MCG/ML (10ML) SYRINGE FOR IV PUSH (FOR BLOOD PRESSURE SUPPORT)
PREFILLED_SYRINGE | INTRAVENOUS | Status: AC
  Filled 2023-07-16: qty 10

## 2023-07-16 MED ORDER — SODIUM CHLORIDE 0.9 % IV SOLN
12.5000 mg | INTRAVENOUS | Status: DC | PRN
Start: 2023-07-16 — End: 2023-07-16

## 2023-07-16 MED ORDER — OXYCODONE HCL 5 MG/5ML PO SOLN: 5.0000 mg | Freq: Once | ORAL | Status: AC | PRN

## 2023-07-16 SURGICAL SUPPLY — 59 items
BAG DECANTER FOR FLEXI CONT (MISCELLANEOUS) IMPLANT
BANDAGE GAUZE 1X75IN STRL (MISCELLANEOUS) IMPLANT
BLADE MINI RND TIP GREEN BEAV (BLADE) IMPLANT
BLADE SURG 15 STRL LF DISP TIS (BLADE) ×2 IMPLANT
BNDG COHESIVE 1X5 TAN STRL LF (GAUZE/BANDAGES/DRESSINGS) IMPLANT
BNDG ELASTIC 2INX 5YD STR LF (GAUZE/BANDAGES/DRESSINGS) IMPLANT
BNDG ELASTIC 3INX 5YD STR LF (GAUZE/BANDAGES/DRESSINGS) IMPLANT
BNDG ESMARK 4X9 LF (GAUZE/BANDAGES/DRESSINGS) IMPLANT
BNDG GAUZE 1X75IN STRL (MISCELLANEOUS)
BNDG GAUZE DERMACEA FLUFF 4 (GAUZE/BANDAGES/DRESSINGS) IMPLANT
CHLORAPREP W/TINT 26 (MISCELLANEOUS) ×1 IMPLANT
CORD BIPOLAR FORCEPS 12FT (ELECTRODE) ×1 IMPLANT
COVER BACK TABLE 60X90IN (DRAPES) ×1 IMPLANT
COVER MAYO STAND STRL (DRAPES) ×1 IMPLANT
CUFF TOURN SGL QUICK 18X3 (MISCELLANEOUS) IMPLANT
CUFF TOURN SGL QUICK 18X4 (TOURNIQUET CUFF) ×1 IMPLANT
DRAIN PENROSE 12X.25 LTX STRL (MISCELLANEOUS) IMPLANT
DRAPE EXTREMITY T 121X128X90 (DISPOSABLE) ×1 IMPLANT
DRAPE OEC MINIVIEW 54X84 (DRAPES) IMPLANT
DRAPE SURG 17X23 STRL (DRAPES) ×1 IMPLANT
GAUZE PACKING IODOFORM 1/4X15 (PACKING) IMPLANT
GAUZE SPONGE 4X4 12PLY STRL (GAUZE/BANDAGES/DRESSINGS) ×1 IMPLANT
GAUZE SPONGE 4X4 12PLY STRL LF (GAUZE/BANDAGES/DRESSINGS) IMPLANT
GAUZE XEROFORM 1X8 LF (GAUZE/BANDAGES/DRESSINGS) ×1 IMPLANT
GLOVE BIO SURGEON STRL SZ7.5 (GLOVE) ×1 IMPLANT
GLOVE BIOGEL PI IND STRL 8 (GLOVE) ×1 IMPLANT
GOWN STRL REUS W/ TWL LRG LVL3 (GOWN DISPOSABLE) ×1 IMPLANT
GOWN STRL REUS W/TWL XL LVL3 (GOWN DISPOSABLE) ×1 IMPLANT
LOOP VASCLR MAXI BLUE 18IN ST (MISCELLANEOUS) IMPLANT
NDL BLUNT 17GA (NEEDLE) IMPLANT
NDL HYPO 25X1 1.5 SAFETY (NEEDLE) IMPLANT
NEEDLE BLUNT 17GA (NEEDLE)
NEEDLE HYPO 25X1 1.5 SAFETY (NEEDLE) ×1
NS IRRIG 1000ML POUR BTL (IV SOLUTION) ×1 IMPLANT
PACK BASIN DAY SURGERY FS (CUSTOM PROCEDURE TRAY) ×1 IMPLANT
PAD CAST 3X4 CTTN HI CHSV (CAST SUPPLIES) IMPLANT
PADDING CAST ABS COTTON 4X4 ST (CAST SUPPLIES) ×1 IMPLANT
SPIKE FLUID TRANSFER (MISCELLANEOUS) IMPLANT
SPLINT FINGER 3.25 911903 (SOFTGOODS) IMPLANT
SPLINT FINGER 3.25 BULB 911905 (SOFTGOODS) IMPLANT
SPLINT PLASTER CAST XFAST 3X15 (CAST SUPPLIES) IMPLANT
STOCKINETTE 4X48 STRL (DRAPES) ×1 IMPLANT
SUT CHROMIC 4 0 P 3 18 (SUTURE) IMPLANT
SUT ETHILON 3 0 PS 1 (SUTURE) IMPLANT
SUT ETHILON 4 0 PS 2 18 (SUTURE) IMPLANT
SUT MERSILENE 4 0 P 3 (SUTURE) IMPLANT
SUT MON AB 5-0 P3 18 (SUTURE) IMPLANT
SUT VIC AB 4-0 P-3 18XBRD (SUTURE) IMPLANT
SWAB COLLECTION DEVICE MRSA (MISCELLANEOUS) IMPLANT
SWAB CULTURE ESWAB REG 1ML (MISCELLANEOUS) IMPLANT
SYR 20ML LL LF (SYRINGE) IMPLANT
SYR BULB EAR ULCER 3OZ GRN STR (SYRINGE) ×1 IMPLANT
SYR CONTROL 10ML LL (SYRINGE) IMPLANT
SYR TOOMEY 50ML (SYRINGE) IMPLANT
TOWEL GREEN STERILE FF (TOWEL DISPOSABLE) ×2 IMPLANT
TRAY DSU PREP LF (CUSTOM PROCEDURE TRAY) ×1 IMPLANT
TUBE NG 5FR 35IN ENFIT (TUBING) IMPLANT
UNDERPAD 30X36 HEAVY ABSORB (UNDERPADS AND DIAPERS) ×1 IMPLANT
VASCULAR TIE MAXI BLUE 18IN ST (MISCELLANEOUS)

## 2023-07-16 NOTE — Discharge Instructions (Addendum)
 Next dose of Tylenol  due anytime after 6:30pm today if needed  [facility] Center Instructions Hand Surgery  Wound Care: Keep your hand elevated above the level of your heart.  Do not allow it to dangle by your side.  Keep the dressing dry and do not remove it unless your doctor advises you to do so.  He will usually change it at the time of your post-op visit.  Moving your fingers is advised to stimulate circulation but will depend on the site of your surgery.  If you have a splint applied, your doctor will advise you regarding movement.  Activity: Do not drive or operate machinery today.  Rest today and then you may return to your normal activity and work as indicated by your physician.  Diet:  Drink liquids today or eat a light diet.  You may resume a regular diet tomorrow.    General expectations: Pain for two to three days. Fingers may become slightly swollen.  Call your doctor if any of the following occur: Severe pain not relieved by pain medication. Elevated temperature. Dressing soaked with blood. Inability to move fingers. White or bluish color to fingers.    Post Anesthesia Home Care Instructions  Activity: Get plenty of rest for the remainder of the day. A responsible individual must stay with you for 24 hours following the procedure.  For the next 24 hours, DO NOT: -Drive a car -Advertising copywriter -Drink alcoholic beverages -Take any medication unless instructed by your physician -Make any legal decisions or sign important papers.  Meals: Start with liquid foods such as gelatin or soup. Progress to regular foods as tolerated. Avoid greasy, spicy, heavy foods. If nausea and/or vomiting occur, drink only clear liquids until the nausea and/or vomiting subsides. Call your physician if vomiting continues.  Special Instructions/Symptoms: Your throat may feel dry or sore from the anesthesia or the breathing tube placed in your throat during surgery. If this causes discomfort,  gargle with warm salt water. The discomfort should disappear within 24 hours.  If you had a scopolamine patch placed behind your ear for the management of post- operative nausea and/or vomiting:  1. The medication in the patch is effective for 72 hours, after which it should be removed.  Wrap patch in a tissue and discard in the trash. Wash hands thoroughly with soap and water. 2. You may remove the patch earlier than 72 hours if you experience unpleasant side effects which may include dry mouth, dizziness or visual disturbances. 3. Avoid touching the patch. Wash your hands with soap and water after contact with the patch.

## 2023-07-16 NOTE — Transfer of Care (Signed)
 Immediate Anesthesia Transfer of Care Note  Patient: [name]  Procedure(s) Performed: revision amputation with incision and drainage left ring finger and right index finger (Bilateral: Hand) INCISION AND DRAINAGE OF LEFT RING AND RIGHT INDEX FINGERS (Bilateral: Hand)  Patient Location: PACU  Anesthesia Type:General  Level of Consciousness: drowsy  Airway & Oxygen Therapy: Patient Spontanous Breathing and Patient connected to face mask oxygen  Post-op Assessment: Report given to RN and Post -op Vital signs reviewed and stable  Post vital signs: Reviewed and stable  Last Vitals:  Vitals Value Taken Time  BP    Temp    Pulse 56 07/16/23 1528  Resp 17 07/16/23 1528  SpO2 100 % 07/16/23 1528  Vitals shown include unfiled device data.  Last Pain:  Vitals:   07/16/23 1219  TempSrc: Temporal  PainSc: 0-No pain         Complications: No notable events documented.

## 2023-07-16 NOTE — Anesthesia Procedure Notes (Signed)
 Procedure Name: LMA Insertion Date/Time: 07/16/2023 2:08 PM  Performed by: Maudine Grayce ORN, CRNAPre-anesthesia Checklist: Patient identified, Emergency Drugs available, Suction available and Patient being monitored Patient Re-evaluated:Patient Re-evaluated prior to induction Oxygen Delivery Method: Circle System Utilized Preoxygenation: Pre-oxygenation with 100% oxygen Induction Type: IV induction Ventilation: Mask ventilation without difficulty LMA: LMA inserted LMA Size: 3.0 Number of attempts: 1 Airway Equipment and Method: Bite block Placement Confirmation: positive ETCO2 and breath sounds checked- equal and bilateral Tube secured with: Tape Dental Injury: Teeth and Oropharynx as per pre-operative assessment

## 2023-07-16 NOTE — H&P (Addendum)
 [name] is an 48 y.o. female.   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  This is painful to her.  She has had previous amputation of the left small finger and has done well.  She wishes to proceed with amputation of necrotic fingertips.  She has also noted a necrotic wound on the left thumb that she would like to have addressed as well.  Allergies: No Known Allergies  Past Medical History:  Diagnosis Date   Anxiety    History of cervical dysplasia    20 yrs ago    Hypertension    Hypothyroidism    PTSD (post-traumatic stress disorder)    Raynaud disease    Sarcoma (HCC)     Past Surgical History:  Procedure Laterality Date   AMPUTATION Left 05/24/2023   Procedure: revision amputation left small finger;  Surgeon: Murrell Drivers, MD;  Location: [facility];  Service: Orthopedics;  Laterality: Left;  time need 45 minutes   APPENDECTOMY     CESAREAN SECTION     LYMPH NODE BIOPSY      Family History: Family History  Problem Relation Age of Onset   Hypertension Mother    Breast cancer Mother    Diabetes Father    Hypertension Father    Breast cancer Paternal Grandmother     Social History:   reports that she quit smoking about 5 weeks ago. Her smoking use included cigarettes. She has never used smokeless tobacco. She reports that she does not currently use alcohol. She reports that she does not use drugs.  Medications: Medications Prior to Admission  Medication Sig Dispense Refill   amLODipine  (NORVASC ) 2.5 MG tablet Take 2.5 mg by mouth daily.     aspirin  EC 81 MG tablet Take 1 tablet (81 mg total) by mouth daily. Swallow whole. 30 tablet 12   atorvastatin  (LIPITOR) 20 MG tablet Take 1 tablet (20 mg total) by mouth daily. 30 tablet 2   hydrOXYzine  (VISTARIL ) 25 MG capsule Take 25 mg by mouth 3 (three) times daily as needed.     meloxicam (MOBIC) 15 MG tablet Take 15 mg by mouth daily.      traZODone  (DESYREL ) 50 MG tablet Take 1 tablet (50 mg total) by mouth at bedtime as needed for sleep. 30 tablet 0   traMADol  (ULTRAM ) 50 MG tablet Take by mouth every 6 (six) hours as needed.      No results found for this or any previous visit (from the past 48 hours).  No results found.    Blood pressure (!) 123/93, pulse 78, temperature 99 F (37.2 C), temperature source Temporal, resp. rate 18, height 5' 3 (1.6 m), weight 47.1 kg, last menstrual period 05/10/2022, SpO2 99%.  General appearance: alert, cooperative, and appears stated age Head: Normocephalic, without obvious abnormality, atraumatic Neck: supple, symmetrical, trachea midline Extremities: Intact sensation and capillary refill all digits.  +epl/fpl/io.  Left ring and right index with non healing necrotic wounds.  There is a small necrotic wound at the tip of the left thumb. Skin: Skin color, texture, turgor normal. No rashes or lesions Neurologic: Grossly normal Incision/Wound: as above  Assessment/Plan Left thumb and ring finger and right index finger non healing necrotic wounds.  She wishes to proceed with amputation of the left ring finger and right index finger and debridement of the left thumb wound.  Anticipate amputation at dip level of right index finger and  possibly at dip level of left ring finger.  If we can preserve the dip joint with good soft tissue coverage we will try to do so.  Non operative and operative treatment options have been discussed with the patient and patient wishes to proceed with operative treatment. Risks, benefits, and alternatives of surgery have been discussed and the patient agrees with the plan of care.   [name] 07/16/2023, 12:59 PM

## 2023-07-16 NOTE — Anesthesia Preprocedure Evaluation (Addendum)
 Anesthesia Evaluation  Patient identified by MRN, date of birth, ID band Patient awake    Reviewed: Allergy & Precautions, NPO status , Patient's Chart, lab work & pertinent test results  History of Anesthesia Complications Negative for: history of anesthetic complications  Airway Mallampati: I  TM Distance: >3 FB Neck ROM: Full    Dental  (+) Dental Advisory Given, Teeth Intact   Pulmonary former smoker   Pulmonary exam normal        Cardiovascular hypertension, Pt. on medications + Peripheral Vascular Disease  Normal cardiovascular exam     Neuro/Psych  PSYCHIATRIC DISORDERS Anxiety     negative neurological ROS     GI/Hepatic negative GI ROS,,,(+)     substance abuse  alcohol use  Endo/Other  Hypothyroidism    Renal/GU negative Renal ROS     Musculoskeletal negative musculoskeletal ROS (+)    Abdominal   Peds  Hematology negative hematology ROS (+)   Anesthesia Other Findings Raynaud's phenomena w/ subsequent dry gangrene    Reproductive/Obstetrics                             Anesthesia Physical Anesthesia Plan  ASA: 3  Anesthesia Plan: General   Post-op Pain Management: Tylenol  PO (pre-op)* and Precedex    Induction: Intravenous  PONV Risk Score and Plan: 3 and Treatment may vary due to age or medical condition, Ondansetron , Dexamethasone  and Midazolam   Airway Management Planned: LMA  Additional Equipment: None  Intra-op Plan:   Post-operative Plan: Extubation in OR  Informed Consent: I have reviewed the patients History and Physical, chart, labs and discussed the procedure including the risks, benefits and alternatives for the proposed anesthesia with the patient or authorized representative who has indicated his/her understanding and acceptance.     Dental advisory given  Plan Discussed with: CRNA and Anesthesiologist  Anesthesia Plan Comments:          Anesthesia Quick Evaluation

## 2023-07-16 NOTE — Anesthesia Postprocedure Evaluation (Signed)
 Anesthesia Post Note  Patient: [name]  Procedure(s) Performed: revision amputation with incision and drainage left ring finger and right index finger (Bilateral: Hand) INCISION AND DRAINAGE OF LEFT RING AND RIGHT INDEX FINGERS (Bilateral: Hand)     Patient location during evaluation: PACU Anesthesia Type: General Level of consciousness: awake and alert Pain management: pain level controlled Vital Signs Assessment: post-procedure vital signs reviewed and stable Respiratory status: spontaneous breathing, nonlabored ventilation and respiratory function stable Cardiovascular status: stable and blood pressure returned to baseline Anesthetic complications: no   No notable events documented.  Last Vitals:  Vitals:   07/16/23 1615 07/16/23 1630  BP: (!) 136/122 (!) 140/87  Pulse: 66 (!) 56  Resp: 15 16  Temp:  (!) 36.3 C  SpO2: 100% 99%                    Debby FORBES Like

## 2023-07-16 NOTE — Op Note (Signed)
 NAME: DESHA BITNER MEDICAL RECORD NO: [id number] DATE OF BIRTH: 02-Mar-1976 FACILITY: [facility] LOCATION: [facility] PHYSICIAN: Laylynn Campanella R. Clema Skousen, MD   OPERATIVE REPORT   DATE OF PROCEDURE: 07/16/23    PREOPERATIVE DIAGNOSIS: Left thumb and ring finger ischemic fingertips with nonhealing wounds, right index finger ischemic fingertip   POSTOPERATIVE DIAGNOSIS: Left thumb and ring finger ischemic fingertips with nonhealing wounds, right index finger ischemic fingertip   PROCEDURE: 1.  Left ring finger amputation through distal phalanx 2.  Left thumb debridement of necrotic skin/wound, skin and subcutaneous tissues, 1 cm x 1/2 cm 3.  Right index finger amputation through distal phalanx   SURGEON:  Franky Curia, M.D.   ASSISTANT: none   ANESTHESIA:  General   INTRAVENOUS FLUIDS:  Per anesthesia flow sheet.   ESTIMATED BLOOD LOSS:  Minimal.   COMPLICATIONS:  None.   SPECIMENS: Left ring fingertip and thumb ischemic wound and right index fingertip to pathology for gross exam   TOURNIQUET TIME:    Total Tourniquet Time Documented: Upper Arm (Left) - 35 minutes Total: Upper Arm (Left) - 35 minutes  Forearm (Right) - 16 minutes Total: Forearm (Right) - 16 minutes    DISPOSITION:  Stable to PACU.   INDICATIONS: 48 year old female with ischemic fingertips.  She is undergone left small finger amputation for ischemic fingertip previously.  She has ischemic nonhealing wounds of the left thumb and ring fingertip and ischemia of the right index fingertip with loss of distal tissue and continued pain.  She wishes to proceed with amputation of the right index fingertip and left ring fingertip and debridement of the left thumb chronic wound.  Risks, benefits and alternatives of surgery were discussed including the risks of blood loss, infection, damage to nerves, vessels, tendons, ligaments, bone for surgery, need for additional surgery, complications with wound healing,  continued pain, stiffness, , need for repeat irrigation and debridement, further amputation.  She voiced understanding of these risks and elected to proceed.  OPERATIVE COURSE:  After being identified preoperatively by myself,  the patient and I agreed on the procedure and site of the procedure.  The surgical site was marked.  Surgical consent had been signed. Preoperative IV antibiotic prophylaxis was given. She was transferred to the operating room and placed on the operating table in supine position with bilateral upper extremities on arm boards.  General anesthesia was induced by the anesthesiologist.  Left upper extremity was prepped and draped in normal sterile orthopedic fashion.  A surgical pause was performed between the surgeons, anesthesia, and operating room staff and all were in agreement as to the patient, procedure, and site of procedure.  Tourniquet at the proximal aspect of the extremity was inflated to 250 mmHg after exsanguination of the arm with an Esmarch bandage.  A fishmouth type incision was made at the distal phalanx of the ring finger.  This was done to preserve viable looking tissue that was with good turgor and appearance.  Incision was carried in subcutaneous tissues with the knife.  Bipolar electrocautery was used to obtain hemostasis.  The entirety of the nail was removed including the germinal matrix.  This was done to allow tension-free coverage over the end of the bone.  The bone cutter was used to amputate the distal phalanx through the midportion preserving the insertion of the flexor and extensor tendons to allow better grip with the digit.  The rongeurs were used to round over the ends of the bone.  The end of  the finger was sent to pathology for gross examination.  The wound was copiously irrigated with sterile saline and reapproximated with 5-0 Monocryl suture in a interrupted fashion.  Good tension-free apposition of soft tissues was obtained.  Attention was turned to the  thumb.  An elliptical incision was made surrounding the ischemic and necrotic section of the thumb at the radial tip.  This was carried into subcutaneous tissues with a knife.  Ischemic tissue was removed.  It was sent to pathology for gross exam.  The wound was irrigated with sterile saline and closed with 5-0 Monocryl in a interrupted fashion.  Good apposition of the skin edges was obtained.  The wounds were dressed with sterile Xeroform and 4 x 4 and wrapped with Coban dressing lightly.  AlumaFoam splints were placed and wrapped lightly with Coban dressing.  Digital blocks have been performed with quarter percent plain Marcaine  to aid in postoperative analgesia.  The tourniquet was deflated at 35 minutes.  Fingertips were pink with brisk capillary refill after deflation of tourniquet.  Right upper extremity was prepped and draped in normal sterile orthopedic fashion.  A surgical pause was performed between the surgeons, anesthesia, and operating room staff and all were in agreement as to the patient, procedure, and site of procedure.  Tourniquet at the proximal aspect of the forearm was inflated to 250 mmHg after exsanguination of the arm with an Esmarch bandage.  A fishmouth type incision was made at the distal phalanx of the index finger.  This was done to remove any skin that appeared ischemic and firm.  Tissue that appeared viable with good turgor was preserved.  The nail was removed in its entirety including the germinal matrix.  The distal phalanx was amputated through its midsection preserving the insertion of the flexor and extensor tendons to provide better motion of the finger.  The rongeurs were used to round over the end of the bone.  The skin was able to be brought over the bone and reapposed.  The wound was copiously irrigated with sterile saline.  5-0 Monocryl suture was used in an interrupted fashion to close the wound.  Good tension-free apposition was obtained.  Digital block was performed with  quarter percent plain Marcaine  to aid in postoperative analgesia.  Wound was dressed with sterile Xeroform 4 x 4 and wrapped with a Coban dressing lightly.  AlumaFoam splint was placed and wrapped lightly with Coban dressing.  The tourniquet was deflated at 16 minutes.  Fingertips were pink with brisk capillary refill after deflation of tourniquet.  The operative  drapes were broken down.  The patient was awoken from anesthesia safely.  She was transferred back to the stretcher and taken to PACU in stable condition.  I will see her back in the office in 1 week for postoperative followup.  I will give her a prescription for oxycodone  5 mg 1 p.o. every 6 hours.  Pain dispense #20.   Hallee Mckenny, MD Electronically signed, 07/16/23

## 2023-07-17 ENCOUNTER — Other Ambulatory Visit: Payer: Self-pay

## 2023-07-17 ENCOUNTER — Emergency Department (HOSPITAL_COMMUNITY)
Admission: EM | Admit: 2023-07-17 | Discharge: 2023-07-17 | Disposition: A | Discharge status: Home / Self Care | Payer: MEDICAID | Attending: Emergency Medicine | Admitting: Emergency Medicine

## 2023-07-17 ENCOUNTER — Encounter (HOSPITAL_BASED_OUTPATIENT_CLINIC_OR_DEPARTMENT_OTHER): Payer: Self-pay | Admitting: Orthopedic Surgery

## 2023-07-17 DIAGNOSIS — Z7982 Long term (current) use of aspirin: Secondary | ICD-10-CM | POA: Insufficient documentation

## 2023-07-17 DIAGNOSIS — R000 Tachycardia, unspecified: Secondary | ICD-10-CM | POA: Diagnosis not present

## 2023-07-17 DIAGNOSIS — E876 Hypokalemia: Secondary | ICD-10-CM | POA: Diagnosis not present

## 2023-07-17 DIAGNOSIS — D72829 Elevated white blood cell count, unspecified: Secondary | ICD-10-CM | POA: Diagnosis not present

## 2023-07-17 DIAGNOSIS — R1114 Bilious vomiting: Secondary | ICD-10-CM | POA: Diagnosis present

## 2023-07-17 LAB — COMPREHENSIVE METABOLIC PANEL
ALT: 10 U/L (ref 0–44)
AST: 23 U/L (ref 15–41)
Albumin: 4.3 g/dL (ref 3.5–5.0)
Alkaline Phosphatase: 53 U/L (ref 38–126)
Anion gap: 18 — ABNORMAL HIGH (ref 5–15)
BUN: 34 mg/dL — ABNORMAL HIGH (ref 6–20)
CO2: 24 mmol/L (ref 22–32)
Calcium: 10.3 mg/dL (ref 8.9–10.3)
Chloride: 92 mmol/L — ABNORMAL LOW (ref 98–111)
Creatinine, Ser: 1.57 mg/dL — ABNORMAL HIGH (ref 0.44–1.00)
GFR, Estimated: 41 mL/min — ABNORMAL LOW (ref >60)
Glucose, Bld: 124 mg/dL — ABNORMAL HIGH (ref 70–99)
Potassium: 2.9 mmol/L — ABNORMAL LOW (ref 3.5–5.1)
Sodium: 134 mmol/L — ABNORMAL LOW (ref 135–145)
Total Bilirubin: 2.4 mg/dL — ABNORMAL HIGH (ref 0.0–1.2)
Total Protein: 7.7 g/dL (ref 6.5–8.1)

## 2023-07-17 LAB — CBC
HCT: 36.4 % (ref 36.0–46.0)
Hemoglobin: 12.5 g/dL (ref 12.0–15.0)
MCH: 30.1 pg (ref 26.0–34.0)
MCHC: 34.3 g/dL (ref 30.0–36.0)
MCV: 87.7 fL (ref 80.0–100.0)
Platelets: 325 10*3/uL (ref 150–400)
RBC: 4.15 MIL/uL (ref 3.87–5.11)
RDW: 13.6 % (ref 11.5–15.5)
WBC: 10.7 10*3/uL — ABNORMAL HIGH (ref 4.0–10.5)
nRBC: 0 % (ref 0.0–0.2)

## 2023-07-17 LAB — URINALYSIS, ROUTINE W REFLEX MICROSCOPIC
Bilirubin Urine: NEGATIVE
Glucose, UA: NEGATIVE mg/dL
Hgb urine dipstick: NEGATIVE
Ketones, ur: 20 mg/dL — AB
Nitrite: NEGATIVE
Protein, ur: 30 mg/dL — AB
Specific Gravity, Urine: 1.017 (ref 1.005–1.030)
pH: 5 (ref 5.0–8.0)

## 2023-07-17 LAB — LIPASE, BLOOD: Lipase: 26 U/L (ref 11–51)

## 2023-07-17 MED ORDER — SODIUM CHLORIDE 0.9 % IV BOLUS
1000.0000 mL | Freq: Once | INTRAVENOUS | Status: AC
  Administered 2023-07-17: 1000 mL via INTRAVENOUS

## 2023-07-17 MED ORDER — POTASSIUM CHLORIDE IN NACL 20-0.9 MEQ/L-% IV SOLN
Freq: Once | INTRAVENOUS | Status: AC
  Filled 2023-07-17: qty 1000

## 2023-07-17 MED ORDER — FENTANYL CITRATE PF 50 MCG/ML IJ SOSY
50.0000 ug | PREFILLED_SYRINGE | Freq: Once | INTRAMUSCULAR | Status: AC
  Administered 2023-07-17: 50 ug via INTRAVENOUS
  Filled 2023-07-17: qty 1

## 2023-07-17 MED ORDER — FENTANYL CITRATE PF 50 MCG/ML IJ SOSY
50.0000 ug | PREFILLED_SYRINGE | INTRAMUSCULAR | Status: DC | PRN
  Administered 2023-07-17: 50 ug via INTRAVENOUS
  Filled 2023-07-17: qty 1

## 2023-07-17 MED ORDER — ONDANSETRON 4 MG PO TBDP: 4.0000 mg | ORAL_TABLET | Freq: Three times a day (TID) | ORAL | 0 refills | Status: DC | PRN

## 2023-07-17 MED ORDER — ONDANSETRON 4 MG PO TBDP
4.0000 mg | ORAL_TABLET | Freq: Once | ORAL | Status: DC | PRN
  Filled 2023-07-17: qty 1

## 2023-07-17 MED ORDER — FENTANYL CITRATE PF 50 MCG/ML IJ SOSY
50.0000 ug | PREFILLED_SYRINGE | Freq: Once | INTRAMUSCULAR | Status: AC
Start: 2023-07-17 — End: 2023-07-17
  Administered 2023-07-17: 50 ug via INTRAVENOUS
  Filled 2023-07-17: qty 1

## 2023-07-17 MED ORDER — PROMETHAZINE HCL 25 MG RE SUPP: 25.0000 mg | Freq: Four times a day (QID) | RECTAL | 0 refills | Status: DC | PRN

## 2023-07-17 MED ORDER — METOCLOPRAMIDE HCL 5 MG/ML IJ SOLN
10.0000 mg | Freq: Once | INTRAMUSCULAR | Status: AC
  Administered 2023-07-17: 10 mg via INTRAVENOUS
  Filled 2023-07-17: qty 2

## 2023-07-17 MED ORDER — PROMETHAZINE HCL 25 MG/ML IJ SOLN
25.0000 mg | Freq: Once | INTRAMUSCULAR | Status: AC
  Administered 2023-07-17: 25 mg via INTRAMUSCULAR
  Filled 2023-07-17: qty 1

## 2023-07-17 NOTE — ED Provider Notes (Signed)
 [place] EMERGENCY DEPARTMENT AT [facility] Provider Note   CSN: [id number] Arrival date & time: 07/17/23  1104     History  Chief Complaint  Patient presents with   Nausea    [name] is a 48 y.o. female.  HPI Patient presents 1 day after amputation of 3 digits, now with nausea, vomiting, weakness, as well as pain.  She notes that she transiently was better on arrival home, but subsequently developed nausea, vomiting and has been unable to take her medications including home meds or pain meds since that time.  Procedure was done due to history of Raynaud's, vasculitis.    Home Medications Prior to Admission medications   Medication Sig Start Date End Date Taking? Authorizing Provider  amLODipine  (NORVASC ) 2.5 MG tablet Take 2.5 mg by mouth daily. 04/20/23   [provider]  aspirin  EC 81 MG tablet Take 1 tablet (81 mg total) by mouth daily. Swallow whole. 04/27/23   Pearline Norman RAMAN, MD  atorvastatin  (LIPITOR) 20 MG tablet Take 1 tablet (20 mg total) by mouth daily. 04/27/23   Pearline Norman RAMAN, MD  hydrOXYzine  (VISTARIL ) 25 MG capsule Take 25 mg by mouth 3 (three) times daily as needed.    [provider]  meloxicam (MOBIC) 15 MG tablet Take 15 mg by mouth daily.    [provider]  oxyCODONE  (ROXICODONE ) 5 MG immediate release tablet Take 1 tablet (5 mg total) by mouth every 6 (six) hours as needed. 07/16/23   Kuzma, Kevin, MD  traMADol  (ULTRAM ) 50 MG tablet Take by mouth every 6 (six) hours as needed.    [provider]  traZODone  (DESYREL ) 50 MG tablet Take 1 tablet (50 mg total) by mouth at bedtime as needed for sleep. 04/01/23   Rainelle Pfeiffer, MD      Allergies    Patient has no known allergies.    Review of Systems   Review of Systems  Physical Exam Updated Vital Signs BP (!) 147/90 (BP Location: Left Arm)   Pulse 65   Temp 98.8 F (37.1 C) (Oral)   Resp 18   Ht 5' 3 (1.6 m)   Wt 47.1 kg   LMP 05/10/2022    SpO2 100%   BMI 18.39 kg/m  Physical Exam Vitals and nursing note reviewed.  Constitutional:      Appearance: She is well-developed. She is ill-appearing.  HENT:     Head: Normocephalic and atraumatic.  Eyes:     Conjunctiva/sclera: Conjunctivae normal.  Cardiovascular:     Rate and Rhythm: Regular rhythm. Tachycardia present.  Pulmonary:     Effort: Pulmonary effort is normal. No respiratory distress.     Breath sounds: Normal breath sounds. No stridor.  Abdominal:     General: There is no distension.  Musculoskeletal:     Comments: Wound dressings on both hands, no proximal erythema, no drainage.  Skin:    General: Skin is warm and dry.  Neurological:     Mental Status: She is alert and oriented to person, place, and time.     Cranial Nerves: No cranial nerve deficit.  Psychiatric:        Mood and Affect: Mood normal.     ED Results / Procedures / Treatments   Labs (all labs ordered are listed, but only abnormal results are displayed) Labs Reviewed  COMPREHENSIVE METABOLIC PANEL - Abnormal; Notable for the following components:      Result Value   Sodium 134 (*)  Potassium 2.9 (*)    Chloride 92 (*)    Glucose, Bld 124 (*)    BUN 34 (*)    Creatinine, Ser 1.57 (*)    Total Bilirubin 2.4 (*)    GFR, Estimated 41 (*)    Anion gap 18 (*)    All other components within normal limits  CBC - Abnormal; Notable for the following components:   WBC 10.7 (*)    All other components within normal limits  URINALYSIS, ROUTINE W REFLEX MICROSCOPIC - Abnormal; Notable for the following components:   APPearance HAZY (*)    Ketones, ur 20 (*)    Protein, ur 30 (*)    Leukocytes,Ua SMALL (*)    Bacteria, UA RARE (*)    All other components within normal limits  LIPASE, BLOOD    EKG None  Radiology No results found.  Procedures Procedures    Medications Ordered in ED Medications  ondansetron  (ZOFRAN -ODT) disintegrating tablet 4 mg (has no administration in  time range)  0.9 % NaCl with KCl 20 mEq/ L  infusion ( Intravenous New Bag/Given 07/17/23 1503)  fentaNYL  (SUBLIMAZE ) injection 50 mcg (has no administration in time range)  promethazine  (PHENERGAN ) injection 25 mg (25 mg Intramuscular Given 07/17/23 1244)  sodium chloride  0.9 % bolus 1,000 mL (0 mLs Intravenous Stopped 07/17/23 1500)  fentaNYL  (SUBLIMAZE ) injection 50 mcg (50 mcg Intravenous Given 07/17/23 1458)  metoCLOPramide  (REGLAN ) injection 10 mg (10 mg Intravenous Given 07/17/23 1458)    ED Course/ Medical Decision Making/ A&P                                 Medical Decision Making Amount and/or Complexity of Data Reviewed External Data Reviewed: notes.    Details: Hand surgery notes from about the patient's surgery, history, op reviewed Labs: ordered. Decision-making details documented in ED Course.  Risk Prescription drug management. Decision regarding hospitalization. Diagnosis or treatment significantly limited by social determinants of health.  Pulse ox 100% room air normal Cardiac 65 sinus normal  Patient markedly better following initial fluids, antiemetics, x 2 with improvement following Phenergan , Reglan , fentanyl .  Patient's labs reviewed, notable for mild hypokalemia consistent with known vomiting.  Given her substantial improvement here with fluids including potassium repletion patient will be appropriate for discharge to follow-up with her hand surgeon for close outpatient follow-up postop.  No current evidence for bacteremia, sepsis, with no fever, hypotension, no sustained tachycardia.  Leukocytosis consistent with recent surgery.        Final Clinical Impression(s) / ED Diagnoses Final diagnoses:  Bilious vomiting with nausea  Hypokalemia     Garrick Charleston, MD 07/17/23 [phone]

## 2023-07-17 NOTE — Discharge Instructions (Signed)
 Monitor your condition carefully, stay well-hydrated, and do not hesitate to return here for concerning changes in your condition.  Be sure to follow-up with your physician for repeat evaluation and for consideration of additional labs to ensure that your electrolytes have stayed normal.

## 2023-07-17 NOTE — ED Notes (Signed)
 Awaiting patient from lobby.

## 2023-07-17 NOTE — ED Notes (Signed)
 Pt has both hands wrapped on.  Pt states "she just had surgery a couple days ago 10/10 pain"

## 2023-07-17 NOTE — ED Notes (Addendum)
 Pt states they "will call a lift" for discharge.  Education of medications reinforced.

## 2023-07-17 NOTE — ED Triage Notes (Signed)
 Pt. Is 1 day post op had 3 fingers amputated.  Pt. Began vomiting and is having severe pain.  She is unable to take pain medication or nausea medication due to vomiting.  She is unable to sit in chair moaning and very anxious

## 2023-07-17 NOTE — ED Provider Triage Note (Signed)
 Emergency Medicine Provider Triage Evaluation Note  [name] , a 48 y.o. female  was evaluated in triage.  Pt complains of nausea, vomiting, p.o. intolerance.  History is notable for Raynaud's.  Patient had amputation of 3 digits yesterday, reportedly unremarkably, with initially feeling fine upon arriving home.  However, she developed nausea soon thereafter and has been unable to take her medications since that time..  Review of Systems  Positive: Nausea Negative: Fever  Physical Exam  BP (!) 144/104 (BP Location: Right Arm)   Pulse 98   Temp 98.5 F (36.9 C)   Resp 18   Ht 5' 3 (1.6 m)   Wt 47.1 kg   LMP 05/10/2022   SpO2 94%   BMI 18.39 kg/m  Gen:   Awake, no distress uncomfortable appearing adult female Resp:  Normal effort no increased work of breathing MSK:   Moves extremities without difficulty bandages on left and right hands with prior amputation no proximal erythema Medical Decision Making  Medically screening exam initiated at 12:31 PM.  Appropriate orders placed.  Brandi A Marquess was informed that the remainder of the evaluation will be completed by another provider, this initial triage assessment does not replace that evaluation, and the importance of remaining in the ED until their evaluation is complete.   Garrick Charleston, MD 07/17/23 [phone]

## 2023-07-18 LAB — SURGICAL PATHOLOGY

## 2023-07-23 ENCOUNTER — Other Ambulatory Visit: Payer: Self-pay | Admitting: Vascular Surgery

## 2023-08-31 ENCOUNTER — Other Ambulatory Visit: Payer: Self-pay

## 2023-09-06 ENCOUNTER — Other Ambulatory Visit: Payer: Self-pay | Admitting: Orthopedic Surgery

## 2023-09-07 ENCOUNTER — Encounter (HOSPITAL_BASED_OUTPATIENT_CLINIC_OR_DEPARTMENT_OTHER): Payer: Self-pay | Admitting: Orthopedic Surgery

## 2023-09-07 ENCOUNTER — Encounter (HOSPITAL_BASED_OUTPATIENT_CLINIC_OR_DEPARTMENT_OTHER)
Admission: RE | Disposition: A | Discharge status: Home / Self Care | Payer: Self-pay | Source: Home / Self Care | Attending: Orthopedic Surgery

## 2023-09-07 ENCOUNTER — Ambulatory Visit (HOSPITAL_BASED_OUTPATIENT_CLINIC_OR_DEPARTMENT_OTHER): Payer: MEDICAID | Admitting: Anesthesiology

## 2023-09-07 ENCOUNTER — Other Ambulatory Visit: Payer: Self-pay

## 2023-09-07 ENCOUNTER — Ambulatory Visit (HOSPITAL_BASED_OUTPATIENT_CLINIC_OR_DEPARTMENT_OTHER)
Admission: RE | Admit: 2023-09-07 | Discharge: 2023-09-07 | Disposition: A | Discharge status: Home / Self Care | Payer: MEDICAID | Attending: Orthopedic Surgery | Admitting: Orthopedic Surgery

## 2023-09-07 DIAGNOSIS — Z87891 Personal history of nicotine dependence: Secondary | ICD-10-CM | POA: Insufficient documentation

## 2023-09-07 DIAGNOSIS — Z79899 Other long term (current) drug therapy: Secondary | ICD-10-CM | POA: Diagnosis not present

## 2023-09-07 DIAGNOSIS — I10 Essential (primary) hypertension: Secondary | ICD-10-CM | POA: Insufficient documentation

## 2023-09-07 DIAGNOSIS — M86142 Other acute osteomyelitis, left hand: Secondary | ICD-10-CM | POA: Diagnosis not present

## 2023-09-07 DIAGNOSIS — I7301 Raynaud's syndrome with gangrene: Secondary | ICD-10-CM | POA: Insufficient documentation

## 2023-09-07 DIAGNOSIS — M879 Osteonecrosis, unspecified: Secondary | ICD-10-CM | POA: Insufficient documentation

## 2023-09-07 DIAGNOSIS — Z01818 Encounter for other preprocedural examination: Secondary | ICD-10-CM

## 2023-09-07 DIAGNOSIS — I96 Gangrene, not elsewhere classified: Secondary | ICD-10-CM | POA: Diagnosis not present

## 2023-09-07 HISTORY — PX: AMPUTATION: SHX166

## 2023-09-07 LAB — POCT PREGNANCY, URINE: Preg Test, Ur: NEGATIVE

## 2023-09-07 SURGERY — AMPUTATION DIGIT
Anesthesia: General | Site: Thumb | Laterality: Left

## 2023-09-07 MED ORDER — BUPIVACAINE HCL (PF) 0.25 % IJ SOLN
INTRAMUSCULAR | Status: DC | PRN
  Administered 2023-09-07: 9 mL

## 2023-09-07 MED ORDER — FENTANYL CITRATE (PF) 100 MCG/2ML IJ SOLN
INTRAMUSCULAR | Status: AC
  Filled 2023-09-07: qty 2

## 2023-09-07 MED ORDER — DEXAMETHASONE SODIUM PHOSPHATE 10 MG/ML IJ SOLN
INTRAMUSCULAR | Status: DC | PRN
  Administered 2023-09-07: 10 mg via INTRAVENOUS

## 2023-09-07 MED ORDER — ONDANSETRON HCL 4 MG/2ML IJ SOLN
INTRAMUSCULAR | Status: DC | PRN
  Administered 2023-09-07: 4 mg via INTRAVENOUS

## 2023-09-07 MED ORDER — OXYCODONE-ACETAMINOPHEN 5-325 MG PO TABS: ORAL_TABLET | ORAL | 0 refills | Status: DC

## 2023-09-07 MED ORDER — LIDOCAINE 2% (20 MG/ML) 5 ML SYRINGE
INTRAMUSCULAR | Status: DC | PRN
  Administered 2023-09-07: 60 mg via INTRAVENOUS

## 2023-09-07 MED ORDER — DOXYCYCLINE HYCLATE 50 MG PO CAPS: 100.0000 mg | ORAL_CAPSULE | Freq: Two times a day (BID) | ORAL | 0 refills | Status: DC

## 2023-09-07 MED ORDER — CEFAZOLIN SODIUM-DEXTROSE 2-3 GM-%(50ML) IV SOLR
INTRAVENOUS | Status: DC | PRN
Start: 2023-09-07 — End: 2023-09-07
  Administered 2023-09-07: 2 g via INTRAVENOUS

## 2023-09-07 MED ORDER — 0.9 % SODIUM CHLORIDE (POUR BTL) OPTIME
TOPICAL | Status: DC | PRN
  Administered 2023-09-07: 100 mL

## 2023-09-07 MED ORDER — FENTANYL CITRATE (PF) 100 MCG/2ML IJ SOLN
INTRAMUSCULAR | Status: DC | PRN
  Administered 2023-09-07 (×2): 50 ug via INTRAVENOUS

## 2023-09-07 MED ORDER — LIDOCAINE 2% (20 MG/ML) 5 ML SYRINGE
INTRAMUSCULAR | Status: AC
  Filled 2023-09-07: qty 5

## 2023-09-07 MED ORDER — MIDAZOLAM HCL 2 MG/2ML IJ SOLN
INTRAMUSCULAR | Status: AC
  Filled 2023-09-07: qty 2

## 2023-09-07 MED ORDER — LACTATED RINGERS IV SOLN: INTRAVENOUS | Status: DC

## 2023-09-07 MED ORDER — PROPOFOL 10 MG/ML IV BOLUS
INTRAVENOUS | Status: DC | PRN
  Administered 2023-09-07: 150 mg via INTRAVENOUS

## 2023-09-07 MED ORDER — FENTANYL CITRATE (PF) 100 MCG/2ML IJ SOLN
INTRAMUSCULAR | Status: AC
Start: 2023-09-07 — End: ?
  Filled 2023-09-07: qty 2

## 2023-09-07 MED ORDER — DROPERIDOL 2.5 MG/ML IJ SOLN: 0.6250 mg | Freq: Once | INTRAMUSCULAR | Status: DC | PRN

## 2023-09-07 MED ORDER — ACETAMINOPHEN 500 MG PO TABS
ORAL_TABLET | ORAL | Status: AC
  Filled 2023-09-07: qty 2

## 2023-09-07 MED ORDER — CEFAZOLIN SODIUM-DEXTROSE 2-4 GM/100ML-% IV SOLN: 2.0000 g | INTRAVENOUS | Status: DC

## 2023-09-07 MED ORDER — ACETAMINOPHEN 500 MG PO TABS
1000.0000 mg | ORAL_TABLET | Freq: Once | ORAL | Status: AC
  Administered 2023-09-07: 1000 mg via ORAL

## 2023-09-07 MED ORDER — FENTANYL CITRATE (PF) 100 MCG/2ML IJ SOLN
25.0000 ug | INTRAMUSCULAR | Status: DC | PRN
  Administered 2023-09-07: 50 ug via INTRAVENOUS

## 2023-09-07 MED ORDER — MIDAZOLAM HCL 5 MG/5ML IJ SOLN
INTRAMUSCULAR | Status: DC | PRN
  Administered 2023-09-07: 2 mg via INTRAVENOUS

## 2023-09-07 MED ORDER — CEFAZOLIN SODIUM-DEXTROSE 2-4 GM/100ML-% IV SOLN
INTRAVENOUS | Status: AC
  Filled 2023-09-07: qty 100

## 2023-09-07 SURGICAL SUPPLY — 45 items
BANDAGE GAUZE 1X75IN STRL (MISCELLANEOUS) IMPLANT
BLADE MINI RND TIP GREEN BEAV (BLADE) IMPLANT
BLADE SURG 15 STRL LF DISP TIS (BLADE) ×2 IMPLANT
BNDG COHESIVE 1X5 TAN STRL LF (GAUZE/BANDAGES/DRESSINGS) IMPLANT
BNDG ELASTIC 2INX 5YD STR LF (GAUZE/BANDAGES/DRESSINGS) IMPLANT
BNDG ELASTIC 3INX 5YD STR LF (GAUZE/BANDAGES/DRESSINGS) IMPLANT
BNDG ESMARK 4X9 LF (GAUZE/BANDAGES/DRESSINGS) IMPLANT
BNDG GAUZE 1X75IN STRL (MISCELLANEOUS) IMPLANT
CHLORAPREP W/TINT 26 (MISCELLANEOUS) IMPLANT
CORD BIPOLAR FORCEPS 12FT (ELECTRODE) ×1 IMPLANT
COVER BACK TABLE 60X90IN (DRAPES) ×1 IMPLANT
COVER MAYO STAND STRL (DRAPES) ×1 IMPLANT
DRAIN PENROSE 12X.25 LTX STRL (MISCELLANEOUS) IMPLANT
DRAPE EXTREMITY T 121X128X90 (DISPOSABLE) ×1 IMPLANT
DRAPE OEC MINIVIEW 54X84 (DRAPES) IMPLANT
DRAPE SURG 17X23 STRL (DRAPES) ×1 IMPLANT
GAUZE SPONGE 4X4 12PLY STRL (GAUZE/BANDAGES/DRESSINGS) ×1 IMPLANT
GAUZE SPONGE 4X4 12PLY STRL LF (GAUZE/BANDAGES/DRESSINGS) IMPLANT
GAUZE XEROFORM 1X8 LF (GAUZE/BANDAGES/DRESSINGS) ×1 IMPLANT
GLOVE BIO SURGEON STRL SZ7.5 (GLOVE) ×1 IMPLANT
GLOVE BIOGEL PI IND STRL 8 (GLOVE) ×1 IMPLANT
GOWN STRL REUS W/ TWL LRG LVL3 (GOWN DISPOSABLE) ×1 IMPLANT
GOWN STRL REUS W/TWL XL LVL3 (GOWN DISPOSABLE) ×1 IMPLANT
NDL HYPO 25X1 1.5 SAFETY (NEEDLE) IMPLANT
NEEDLE HYPO 25X1 1.5 SAFETY (NEEDLE) ×1 IMPLANT
NS IRRIG 1000ML POUR BTL (IV SOLUTION) ×1 IMPLANT
PACK BASIN DAY SURGERY FS (CUSTOM PROCEDURE TRAY) ×1 IMPLANT
PADDING CAST ABS COTTON 4X4 ST (CAST SUPPLIES) ×1 IMPLANT
SPIKE FLUID TRANSFER (MISCELLANEOUS) IMPLANT
SPLINT FINGER 3.25 BULB 911905 (SOFTGOODS) IMPLANT
STOCKINETTE 4X48 STRL (DRAPES) ×1 IMPLANT
SUT CHROMIC 4 0 P 3 18 (SUTURE) IMPLANT
SUT ETHILON 3 0 PS 1 (SUTURE) IMPLANT
SUT ETHILON 4 0 PS 2 18 (SUTURE) IMPLANT
SUT MERSILENE 4 0 P 3 (SUTURE) IMPLANT
SUT MON AB 5-0 P3 18 (SUTURE) IMPLANT
SUT MON AB 5-0 PS2 18 (SUTURE) IMPLANT
SUT VIC AB 4-0 P-3 18XBRD (SUTURE) IMPLANT
SWAB COLLECTION DEVICE MRSA (MISCELLANEOUS) IMPLANT
SWAB CULTURE ESWAB REG 1ML (MISCELLANEOUS) IMPLANT
SYR BULB EAR ULCER 3OZ GRN STR (SYRINGE) ×1 IMPLANT
SYR CONTROL 10ML LL (SYRINGE) IMPLANT
TOWEL GREEN STERILE FF (TOWEL DISPOSABLE) ×1 IMPLANT
TRAY DSU PREP LF (CUSTOM PROCEDURE TRAY) ×1 IMPLANT
UNDERPAD 30X36 HEAVY ABSORB (UNDERPADS AND DIAPERS) ×1 IMPLANT

## 2023-09-07 NOTE — Transfer of Care (Signed)
 Immediate Anesthesia Transfer of Care Note  Patient: Normalee A Stanis  Procedure(s) Performed: AMPUTATION DIGIT LEFT THUMB (Left: Thumb)  Patient Location: PACU  Anesthesia Type:General  Level of Consciousness: drowsy  Airway & Oxygen Therapy: Patient Spontanous Breathing and Patient connected to face mask oxygen  Post-op Assessment: Report given to RN and Post -op Vital signs reviewed and stable  Post vital signs: Reviewed and stable  Last Vitals:  Vitals Value Taken Time  BP 115/86 09/07/23 1554  Temp    Pulse 65 09/07/23 1555  Resp 15 09/07/23 1555  SpO2 100 % 09/07/23 1555  Vitals shown include unfiled device data.  Last Pain:  Vitals:   09/07/23 1313  TempSrc: Temporal  PainSc: 0-No pain         Complications: No notable events documented.

## 2023-09-07 NOTE — Anesthesia Procedure Notes (Signed)
 Procedure Name: LMA Insertion Date/Time: 09/07/2023 3:10 PM  Performed by: Roosvelt Harps, CRNAPre-anesthesia Checklist: Patient identified, Emergency Drugs available, Suction available and Patient being monitored Patient Re-evaluated:Patient Re-evaluated prior to induction Oxygen Delivery Method: Circle System Utilized Preoxygenation: Pre-oxygenation with 100% oxygen Induction Type: IV induction Ventilation: Mask ventilation without difficulty LMA: LMA inserted LMA Size: 4.0 Number of attempts: 1 Airway Equipment and Method: Bite block Placement Confirmation: positive ETCO2 Tube secured with: Tape Dental Injury: Teeth and Oropharynx as per pre-operative assessment

## 2023-09-07 NOTE — Op Note (Signed)
 NAME: [name] MEDICAL RECORD NO: [id number] DATE OF BIRTH: [birth year] FACILITY: [facility] LOCATION: [facility] PHYSICIAN: Tami Ribas, MD   OPERATIVE REPORT   DATE OF PROCEDURE: 09/07/23    PREOPERATIVE DIAGNOSIS: Left thumb necrosis   POSTOPERATIVE DIAGNOSIS: Left thumb necrosis   PROCEDURE: Amputation left thumb through nail   SURGEON:  Betha Loa, M.D.   ASSISTANT: none   ANESTHESIA:  General   INTRAVENOUS FLUIDS:  Per anesthesia flow sheet.   ESTIMATED BLOOD LOSS:  Minimal.   COMPLICATIONS:  None.   SPECIMENS: Left thumb tip to pathology for gross exam only   TOURNIQUET TIME:    Total Tourniquet Time Documented: Upper Arm (Left) - 21 minutes Total: Upper Arm (Left) - 21 minutes    DISPOSITION:  Stable to PACU.   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  It is painful.  She has had amputation of other digits for necrosis.  She wishes to proceed with a distal amputation of the thumb.  Risks, benefits and alternatives of surgery were discussed including the risks of blood loss, infection, damage to nerves, vessels, tendons, ligaments, bone for surgery, need for additional surgery, complications with wound healing, continued pain, stiffness, , need for repeat irrigation and debridement.  She voiced understanding of these risks and elected to proceed.  OPERATIVE COURSE:  After being identified preoperatively by myself,  the patient and I agreed on the procedure and site of the procedure.  The surgical site was marked.  Surgical consent had been signed. Preoperative IV antibiotic prophylaxis was given. She was transferred to the operating room and placed on the operating table in supine position with the Left upper extremity on an arm board.  General anesthesia was induced by the anesthesiologist.  Left upper extremity was prepped and draped in normal sterile orthopedic fashion.  A surgical pause was performed  between the surgeons, anesthesia, and operating room staff and all were in agreement as to the patient, procedure, and site of procedure.  Tourniquet at the proximal aspect of the extremity was inflated to 250 mmHg after exsanguination of the arm with an Esmarch bandage.  The necrotic tissue was pulled back.  There is small amount of gross purulence.  Cultures were taken for aerobes and anaerobes.  The nail was removed with a freer elevator.  Incision was made through the nail at the base of the necrotic portion of the thumb.  The necrotic tissues were circumscribed with the knife.  The bone cutter was used to amputate the thumb through the distal phalanx removing the necrotic tissue.  It was sent to pathology for gross exam only.  The bone was then smoothed out and shortened using the rongeurs.  The nailbed was shortened as necessary to ensure good support under the nailbed.  The bone was shortened just until the remaining viable soft tissues were able to be brought over the end of the bone and reapproximated to the soft tissues.  The wound was then copiously irrigated with sterile saline.  The soft tissues were reapproximated with 5-0 Monocryl suture in an interrupted fashion.  Good reapproximation was obtained.  A piece of Xeroform was placed in the nail fold and the wound dressed with sterile Xeroform 4 x 4 and wrapped with a Coban dressing lightly.  AlumaFoam splint was placed and wrapped lightly with Coban dressing.  A digital block of been performed with quarter percent plain Marcaine to aid in postoperative analgesia.  The tourniquet was deflated at 21 minutes.  Fingertips were pink with brisk capillary refill after deflation of tourniquet.  The operative  drapes were broken down.  The patient was awoken from anesthesia safely.  She was transferred back to the stretcher and taken to PACU in stable condition.  I will see her back in the office in 1 week for postoperative followup.  I will give her a  prescription for Percocet 5/325 1-2 tabs PO q6 hours prn pain, dispense # 20.   Betha Loa, MD Electronically signed, 09/07/23

## 2023-09-07 NOTE — H&P (Signed)
 [name] is an 48 y.o. female.   Chief Complaint: finger necrosis HPI: 48 yo female with left thumb necrosis at tip of digit.  It is painful.  She has had amputation of other digits for necrosis and wishes to proceed with amputation of the tip of the thumb.  Allergies: No Known Allergies  Past Medical History:  Diagnosis Date   Anxiety    History of cervical dysplasia    20 yrs ago    Hypertension    Hypothyroidism    PTSD (post-traumatic stress disorder)    Raynaud disease    Sarcoma (HCC)     Past Surgical History:  Procedure Laterality Date   AMPUTATION Left 05/24/2023   Procedure: revision amputation left small finger;  Surgeon: Betha Loa, MD;  Location: [facility];  Service: Orthopedics;  Laterality: Left;  time need 45 minutes   AMPUTATION Bilateral 07/16/2023   Procedure: revision amputation with incision and drainage left ring finger and right index finger;  Surgeon: Betha Loa, MD;  Location: [facility];  Service: Orthopedics;  Laterality: Bilateral;   APPENDECTOMY     CESAREAN SECTION     INCISION AND DRAINAGE Bilateral 07/16/2023   Procedure: INCISION AND DRAINAGE OF LEFT RING AND RIGHT INDEX FINGERS;  Surgeon: Betha Loa, MD;  Location: [facility];  Service: Orthopedics;  Laterality: Bilateral;   LYMPH NODE BIOPSY      Family History: Family History  Problem Relation Age of Onset   Hypertension Mother    Breast cancer Mother    Diabetes Father    Hypertension Father    Breast cancer Paternal Grandmother     Social History:   reports that she quit smoking about 2 months ago. Her smoking use included cigarettes. She has never used smokeless tobacco. She reports that she does not currently use alcohol. She reports that she does not use drugs.  Medications: Medications Prior to Admission  Medication Sig Dispense Refill   amLODipine (NORVASC) 2.5 MG tablet Take 2.5 mg by mouth daily.     aspirin EC 81  MG tablet Take 1 tablet (81 mg total) by mouth daily. Swallow whole. 30 tablet 12   hydrOXYzine (VISTARIL) 25 MG capsule Take 25 mg by mouth 3 (three) times daily as needed.     meloxicam (MOBIC) 15 MG tablet Take 15 mg by mouth daily.     ondansetron (ZOFRAN-ODT) 4 MG disintegrating tablet Take 1 tablet (4 mg total) by mouth every 8 (eight) hours as needed for nausea or vomiting. 20 tablet 0   oxyCODONE (ROXICODONE) 5 MG immediate release tablet Take 1 tablet (5 mg total) by mouth every 6 (six) hours as needed. 20 tablet 0   promethazine (PHENERGAN) 25 MG suppository Place 1 suppository (25 mg total) rectally every 6 (six) hours as needed for nausea or vomiting. 12 each 0   traMADol (ULTRAM) 50 MG tablet Take by mouth every 6 (six) hours as needed.     traZODone (DESYREL) 50 MG tablet Take 1 tablet (50 mg total) by mouth at bedtime as needed for sleep. 30 tablet 0   atorvastatin (LIPITOR) 20 MG tablet Take 1 tablet (20 mg total) by mouth daily. (Patient not taking: Reported on 08/31/2023) 30 tablet 2    Results for orders placed or performed during the hospital encounter of 09/07/23 (from the past 48 hours)  Pregnancy, urine POC     Status: None   Collection Time: 09/07/23 12:58 PM  Result  Value Ref Range   Preg Test, Ur NEGATIVE NEGATIVE    Comment:        THE SENSITIVITY OF THIS METHODOLOGY IS >24 mIU/mL     No results found.    Blood pressure (!) 151/96, pulse 77, temperature 98.5 F (36.9 C), temperature source Temporal, resp. rate 18, height 5\' 3"  (1.6 m), weight 48.6 kg, SpO2 98%.  General appearance: alert, cooperative, and appears stated age Head: Normocephalic, without obvious abnormality, atraumatic Neck: supple, symmetrical, trachea midline Extremities: Intact sensation and capillary refill all digits.  +epl/fpl/io.  No wounds.  Skin: Skin color, texture, turgor normal. No rashes or lesions Neurologic: Grossly normal Incision/Wound: none  Assessment/Plan Left thumb  dry gangrene.  Plan amputation of tip of thumb.  Non operative and operative treatment options have been discussed with the patient and patient wishes to proceed with operative treatment. Risks, benefits, and alternatives of surgery have been discussed and the patient agrees with the plan of care.   Betha Loa 09/07/2023, 1:21 PM

## 2023-09-07 NOTE — Anesthesia Preprocedure Evaluation (Signed)
 Anesthesia Evaluation  Patient identified by MRN, date of birth, ID band Patient awake    Reviewed: Allergy & Precautions, NPO status , Patient's Chart, lab work & pertinent test results  Airway Mallampati: II  TM Distance: >3 FB Neck ROM: Full    Dental  (+) Dental Advisory Given   Pulmonary former smoker   breath sounds clear to auscultation       Cardiovascular hypertension, Pt. on medications  Rhythm:Regular Rate:Normal     Neuro/Psych negative neurological ROS     GI/Hepatic negative GI ROS, Neg liver ROS,,,  Endo/Other  Hypothyroidism    Renal/GU negative Renal ROS     Musculoskeletal   Abdominal   Peds  Hematology negative hematology ROS (+)   Anesthesia Other Findings   Reproductive/Obstetrics                             Anesthesia Physical Anesthesia Plan  ASA: 2  Anesthesia Plan: General   Post-op Pain Management: Tylenol PO (pre-op)* and Toradol IV (intra-op)*   Induction: Intravenous  PONV Risk Score and Plan: 3 and Dexamethasone, Ondansetron, Midazolam and Treatment may vary due to age or medical condition  Airway Management Planned: LMA  Additional Equipment:   Intra-op Plan:   Post-operative Plan: Extubation in OR  Informed Consent: I have reviewed the patients History and Physical, chart, labs and discussed the procedure including the risks, benefits and alternatives for the proposed anesthesia with the patient or authorized representative who has indicated his/her understanding and acceptance.     Dental advisory given  Plan Discussed with: CRNA  Anesthesia Plan Comments:        Anesthesia Quick Evaluation

## 2023-09-07 NOTE — Anesthesia Postprocedure Evaluation (Signed)
 Anesthesia Post Note  Patient: [name]  Procedure(s) Performed: AMPUTATION DIGIT LEFT THUMB (Left: Thumb)     Patient location during evaluation: PACU Anesthesia Type: General Level of consciousness: awake and alert Pain management: pain level controlled Vital Signs Assessment: post-procedure vital signs reviewed and stable Respiratory status: spontaneous breathing, nonlabored ventilation, respiratory function stable and patient connected to nasal cannula oxygen Cardiovascular status: blood pressure returned to baseline and stable Postop Assessment: no apparent nausea or vomiting Anesthetic complications: no  No notable events documented.  Last Vitals:  Vitals:   09/07/23 1629 09/07/23 1655  BP: (!) 167/104 (!) 151/91  Pulse: 77 69  Resp: 16 18  Temp:  36.4 C  SpO2: 97% 96%    Last Pain:  Vitals:   09/07/23 1655  TempSrc: Tympanic  PainSc: 3                  Kennieth Rad

## 2023-09-07 NOTE — Discharge Instructions (Addendum)
  Post Anesthesia Home Care Instructions  Activity: Get plenty of rest for the remainder of the day. A responsible individual must stay with you for 24 hours following the procedure.  For the next 24 hours, DO NOT: -Drive a car -Advertising copywriter -Drink alcoholic beverages -Take any medication unless instructed by your physician -Make any legal decisions or sign important papers.  Meals: Start with liquid foods such as gelatin or soup. Progress to regular foods as tolerated. Avoid greasy, spicy, heavy foods. If nausea and/or vomiting occur, drink only clear liquids until the nausea and/or vomiting subsides. Call your physician if vomiting continues.  Special Instructions/Symptoms: Your throat may feel dry or sore from the anesthesia or the breathing tube placed in your throat during surgery. If this causes discomfort, gargle with warm salt water. The discomfort should disappear within 24 hours.  If you had a scopolamine patch placed behind your ear for the management of post- operative nausea and/or vomiting:  1. The medication in the patch is effective for 72 hours, after which it should be removed.  Wrap patch in a tissue and discard in the trash. Wash hands thoroughly with soap and water. 2. You may remove the patch earlier than 72 hours if you experience unpleasant side effects which may include dry mouth, dizziness or visual disturbances. 3. Avoid touching the patch. Wash your hands with soap and water after contact with the patch.  TOOK TYLENOL AT 1:15PM.No additional for 6 hours.    [facility] Instructions Hand Surgery  Wound Care: Keep your hand elevated above the level of your heart.  Do not allow it to dangle by your side.  Keep the dressing dry and do not remove it unless your doctor advises you to do so.  He will usually change it at the time of your post-op visit.  Moving your fingers is advised to stimulate circulation but will depend on the site of your surgery.  If you  have a splint applied, your doctor will advise you regarding movement.  Activity: Do not drive or operate machinery today.  Rest today and then you may return to your normal activity and work as indicated by your physician.  Diet:  Drink liquids today or eat a light diet.  You may resume a regular diet tomorrow.    General expectations: Pain for two to three days. Fingers may become slightly swollen.  Call your doctor if any of the following occur: Severe pain not relieved by pain medication. Elevated temperature. Dressing soaked with blood. Inability to move fingers. White or bluish color to fingers.

## 2023-09-08 ENCOUNTER — Encounter (HOSPITAL_BASED_OUTPATIENT_CLINIC_OR_DEPARTMENT_OTHER): Payer: Self-pay | Admitting: Orthopedic Surgery

## 2023-09-11 LAB — SURGICAL PATHOLOGY

## 2023-09-12 LAB — AEROBIC/ANAEROBIC CULTURE W GRAM STAIN (SURGICAL/DEEP WOUND)

## 2023-09-25 ENCOUNTER — Ambulatory Visit: Payer: MEDICAID | Attending: Physician Assistant | Admitting: Occupational Therapy

## 2023-09-25 DIAGNOSIS — R208 Other disturbances of skin sensation: Secondary | ICD-10-CM | POA: Insufficient documentation

## 2023-09-25 DIAGNOSIS — I7301 Raynaud's syndrome with gangrene: Secondary | ICD-10-CM | POA: Insufficient documentation

## 2023-09-25 DIAGNOSIS — M6281 Muscle weakness (generalized): Secondary | ICD-10-CM | POA: Insufficient documentation

## 2023-09-25 DIAGNOSIS — R278 Other lack of coordination: Secondary | ICD-10-CM | POA: Insufficient documentation

## 2023-09-25 DIAGNOSIS — I998 Other disorder of circulatory system: Secondary | ICD-10-CM | POA: Insufficient documentation

## 2023-09-25 NOTE — Therapy (Deleted)
 OUTPATIENT OCCUPATIONAL THERAPY NEURO EVALUATION  Patient Name: [name] MRN: [id number] DOB:[birth year], 48 y.o., female Today's Date: 09/25/2023  PCP: [facility]  REFERRING PROVIDER: Reubin Milan, PA-C   END OF SESSION:   Past Medical History:  Diagnosis Date   Anxiety    History of cervical dysplasia    20 yrs ago    Hypertension    Hypothyroidism    PTSD (post-traumatic stress disorder)    Raynaud disease    Sarcoma (HCC)    Past Surgical History:  Procedure Laterality Date   AMPUTATION Left 05/24/2023   Procedure: revision amputation left small finger;  Surgeon: Betha Loa, MD;  Location: [facility];  Service: Orthopedics;  Laterality: Left;  time need 45 minutes   AMPUTATION Bilateral 07/16/2023   Procedure: revision amputation with incision and drainage left ring finger and right index finger;  Surgeon: Betha Loa, MD;  Location: [facility];  Service: Orthopedics;  Laterality: Bilateral;   AMPUTATION Left 09/07/2023   Procedure: AMPUTATION DIGIT LEFT THUMB;  Surgeon: Betha Loa, MD;  Location: [facility];  Service: Orthopedics;  Laterality: Left;   APPENDECTOMY     CESAREAN SECTION     INCISION AND DRAINAGE Bilateral 07/16/2023   Procedure: INCISION AND DRAINAGE OF LEFT RING AND RIGHT INDEX FINGERS;  Surgeon: Betha Loa, MD;  Location:  SUR[facility]ERY [facility];  Service: Orthopedics;  Laterality: Bilateral;   LYMPH NODE BIOPSY     Patient Active Problem List   Diagnosis Date Noted   Alcohol dependence (HCC) 03/29/2023   Alcohol withdrawal (HCC) 04/13/2021   Alcohol use disorder, severe, dependence (HCC) 04/12/2021    ONSET DATE: 09/19/23 (referral date), partial amputation L thumb s/p 09/07/23  REFERRING DIAG:  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  Rationale for Evaluation and Treatment:  Rehabilitation  SUBJECTIVE:   SUBJECTIVE STATEMENT: *** Pt accompanied by: {accompnied:27141}  PERTINENT HISTORY: partial amputation L thumb s/p 09/07/23 secondary for ischemia and gangrene... 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 She states she is stopped smoking and has stopped drinking caffeine."  PRECAUTIONS: {Therapy precautions:24002}  WEIGHT BEARING RESTRICTIONS: {Yes ***/No:24003}  PAIN:  Are you having pain? {OPRCPAIN:27236}  FALLS: Has patient fallen in last 6 months? {fallsyesno:27318}  LIVING ENVIRONMENT: Lives with: {OPRC lives with:25569::"lives with their family"} Lives in: {Lives in:25570} Stairs: {opstairs:27293} Has following equipment at home: {Assistive devices:23999}  PLOF: {PLOF:24004}  PATIENT GOALS: ***  OBJECTIVE:  Note: Objective measures were completed at Evaluation unless otherwise noted.  HAND DOMINANCE: {MISC; OT HAND DOMINANCE:[id number]}  ADLs: Overall ADLs: *** Transfers/ambulation related to ADLs: Eating: *** Grooming: *** UB Dressing: *** LB Dressing: *** Toileting: *** Bathing: *** Tub Shower transfers: *** Equipment: {equipment:25573}  IADLs: Shopping: *** Light housekeeping: *** Meal Prep: *** Community mobility: *** Medication management: *** Financial management: *** Handwriting: {OTWRITTENEXPRESSION:25361}  MOBILITY STATUS: {OTMOBILITY:25360}  POSTURE COMMENTS:  {posture:25561} Sitting balance: {sitting balance:25483}  ACTIVITY TOLERANCE: Activity tolerance: ***  FUNCTIONAL OUTCOME MEASURES: {OTFUNCTIONALMEASURES:27238}  UPPER EXTREMITY ROM:    {AROM/PROM:27142} ROM Right eval Left eval  Shoulder flexion    Shoulder abduction    Shoulder adduction    Shoulder extension    Shoulder internal rotation    Shoulder external rotation    Elbow flexion    Elbow extension    Wrist  flexion    Wrist extension     Wrist ulnar deviation    Wrist radial deviation    Wrist pronation    Wrist supination    (Blank rows = not tested)  UPPER EXTREMITY MMT:     MMT Right eval Left eval  Shoulder flexion    Shoulder abduction    Shoulder adduction    Shoulder extension    Shoulder internal rotation    Shoulder external rotation    Middle trapezius    Lower trapezius    Elbow flexion    Elbow extension    Wrist flexion    Wrist extension    Wrist ulnar deviation    Wrist radial deviation    Wrist pronation    Wrist supination    (Blank rows = not tested)  HAND FUNCTION: {handfunction:27230}  COORDINATION: {otcoordination:27237}  SENSATION: {sensation:27233}  EDEMA: ***  MUSCLE TONE: {UETONE:25567}  COGNITION: Overall cognitive status: {cognition:24006}  VISION: Subjective report: *** Baseline vision: {OTBASELINEVISION:25363} Visual history: {OTVISUALHISTORY:25364}  VISION ASSESSMENT: {visionassessment:27231}  Patient has difficulty with following activities due to following visual impairments: ***  PERCEPTION: {Perception:25564}  PRAXIS: {Praxis:25565}  OBSERVATIONS: ***                                                                                                                             TREATMENT DATE: ***         PATIENT EDUCATION: Education details: *** Person educated: {Person educated:25204} Education method: {Education Method:25205} Education comprehension: {Education Comprehension:25206}  HOME EXERCISE PROGRAM: ***   GOALS: Goals reviewed with patient? {yes/no:20286}  SHORT TERM GOALS: Target date: ***  *** Baseline: Goal status: INITIAL  2.  *** Baseline:  Goal status: INITIAL  3.  *** Baseline:  Goal status: INITIAL  4.  *** Baseline:  Goal status: INITIAL  5.  *** Baseline:  Goal status: INITIAL  6.  *** Baseline:  Goal status: INITIAL  LONG TERM GOALS: Target date: ***  *** Baseline:  Goal status:  INITIAL  2.  *** Baseline:  Goal status: INITIAL  3.  *** Baseline:  Goal status: INITIAL  4.  *** Baseline:  Goal status: INITIAL  5.  *** Baseline:  Goal status: INITIAL  6.  *** Baseline:  Goal status: INITIAL  ASSESSMENT:  CLINICAL IMPRESSION: Patient is a *** y.o. *** who was seen today for occupational therapy evaluation for ***.   PERFORMANCE DEFICITS: in functional skills including {OT physical skills:25468}, cognitive skills including {OT cognitive skills:25469}, and psychosocial skills including {OT psychosocial skills:25470}.   IMPAIRMENTS: are limiting patient from {OT performance deficits:25471}.   CO-MORBIDITIES: {Comorbidities:25485} that affects occupational performance. Patient will benefit from skilled OT to address above impairments and improve overall function.  MODIFICATION OR ASSISTANCE TO COMPLETE EVALUATION: {OT modification:25474}  OT OCCUPATIONAL PROFILE AND HISTORY: {OT PROFILE AND HISTORY:25484}  CLINICAL DECISION MAKING: {OT CDM:25475}  REHAB POTENTIAL: {rehabpotential:25112}  EVALUATION COMPLEXITY: {Evaluation complexity:25115}    PLAN:  OT  FREQUENCY: {rehab frequency:25116}  OT DURATION: {rehab duration:25117}  PLANNED INTERVENTIONS: {OT Interventions:25467}  RECOMMENDED OTHER SERVICES: ***  CONSULTED AND AGREED WITH PLAN OF CARE: {UVO:53664}  PLAN FOR NEXT SESSION: ***   Wynetta Emery, OT 09/25/2023, 7:52 AM

## 2023-09-27 ENCOUNTER — Other Ambulatory Visit: Payer: Self-pay

## 2023-09-27 ENCOUNTER — Ambulatory Visit (HOSPITAL_BASED_OUTPATIENT_CLINIC_OR_DEPARTMENT_OTHER)
Admission: RE | Admit: 2023-09-27 | Discharge: 2023-09-27 | Disposition: A | Discharge status: Home / Self Care | Payer: MEDICAID | Attending: Orthopedic Surgery | Admitting: Orthopedic Surgery

## 2023-09-27 ENCOUNTER — Ambulatory Visit (HOSPITAL_BASED_OUTPATIENT_CLINIC_OR_DEPARTMENT_OTHER): Payer: MEDICAID | Admitting: Anesthesiology

## 2023-09-27 ENCOUNTER — Encounter (HOSPITAL_BASED_OUTPATIENT_CLINIC_OR_DEPARTMENT_OTHER): Payer: Self-pay | Admitting: Orthopedic Surgery

## 2023-09-27 ENCOUNTER — Encounter (HOSPITAL_BASED_OUTPATIENT_CLINIC_OR_DEPARTMENT_OTHER)
Admission: RE | Disposition: A | Discharge status: Home / Self Care | Payer: Self-pay | Source: Home / Self Care | Attending: Orthopedic Surgery

## 2023-09-27 DIAGNOSIS — I10 Essential (primary) hypertension: Secondary | ICD-10-CM | POA: Insufficient documentation

## 2023-09-27 DIAGNOSIS — M87844 Other osteonecrosis, right finger(s): Secondary | ICD-10-CM | POA: Diagnosis present

## 2023-09-27 DIAGNOSIS — Z87891 Personal history of nicotine dependence: Secondary | ICD-10-CM | POA: Insufficient documentation

## 2023-09-27 DIAGNOSIS — I96 Gangrene, not elsewhere classified: Secondary | ICD-10-CM

## 2023-09-27 DIAGNOSIS — M86641 Other chronic osteomyelitis, right hand: Secondary | ICD-10-CM | POA: Insufficient documentation

## 2023-09-27 HISTORY — PX: AMPUTATION FINGER: SHX6594

## 2023-09-27 SURGERY — AMPUTATION, FINGER
Anesthesia: General | Site: Finger | Laterality: Right

## 2023-09-27 MED ORDER — ONDANSETRON HCL 4 MG/2ML IJ SOLN: 4.0000 mg | Freq: Once | INTRAMUSCULAR | Status: DC | PRN

## 2023-09-27 MED ORDER — PHENYLEPHRINE 80 MCG/ML (10ML) SYRINGE FOR IV PUSH (FOR BLOOD PRESSURE SUPPORT)
PREFILLED_SYRINGE | INTRAVENOUS | Status: AC
  Filled 2023-09-27: qty 10

## 2023-09-27 MED ORDER — PROPOFOL 10 MG/ML IV BOLUS
INTRAVENOUS | Status: DC | PRN
  Administered 2023-09-27: 200 mg via INTRAVENOUS

## 2023-09-27 MED ORDER — ACETAMINOPHEN 10 MG/ML IV SOLN: 1000.0000 mg | Freq: Once | INTRAVENOUS | Status: DC | PRN

## 2023-09-27 MED ORDER — AMISULPRIDE (ANTIEMETIC) 5 MG/2ML IV SOLN: 10.0000 mg | Freq: Once | INTRAVENOUS | Status: DC | PRN

## 2023-09-27 MED ORDER — LIDOCAINE 2% (20 MG/ML) 5 ML SYRINGE
INTRAMUSCULAR | Status: AC
  Filled 2023-09-27: qty 5

## 2023-09-27 MED ORDER — OXYCODONE HCL 5 MG PO TABS: 5.0000 mg | ORAL_TABLET | Freq: Once | ORAL | Status: DC | PRN

## 2023-09-27 MED ORDER — DEXAMETHASONE SODIUM PHOSPHATE 10 MG/ML IJ SOLN
INTRAMUSCULAR | Status: AC
  Filled 2023-09-27: qty 1

## 2023-09-27 MED ORDER — PHENYLEPHRINE 80 MCG/ML (10ML) SYRINGE FOR IV PUSH (FOR BLOOD PRESSURE SUPPORT)
PREFILLED_SYRINGE | INTRAVENOUS | Status: DC | PRN
  Administered 2023-09-27 (×2): 160 ug via INTRAVENOUS

## 2023-09-27 MED ORDER — KETOROLAC TROMETHAMINE 30 MG/ML IJ SOLN
INTRAMUSCULAR | Status: AC
  Filled 2023-09-27: qty 1

## 2023-09-27 MED ORDER — DEXAMETHASONE SODIUM PHOSPHATE 10 MG/ML IJ SOLN
INTRAMUSCULAR | Status: DC | PRN
Start: 2023-09-27 — End: 2023-09-27
  Administered 2023-09-27: 4 mg via INTRAVENOUS

## 2023-09-27 MED ORDER — BUPIVACAINE HCL (PF) 0.25 % IJ SOLN
INTRAMUSCULAR | Status: AC
Start: 2023-09-27 — End: ?
  Filled 2023-09-27: qty 30

## 2023-09-27 MED ORDER — ACETAMINOPHEN 500 MG PO TABS
1000.0000 mg | ORAL_TABLET | Freq: Once | ORAL | Status: AC
  Administered 2023-09-27: 1000 mg via ORAL

## 2023-09-27 MED ORDER — DOXYCYCLINE HYCLATE 50 MG PO CAPS: 100.0000 mg | ORAL_CAPSULE | Freq: Two times a day (BID) | ORAL | 0 refills | Status: DC

## 2023-09-27 MED ORDER — LIDOCAINE 2% (20 MG/ML) 5 ML SYRINGE
INTRAMUSCULAR | Status: DC | PRN
  Administered 2023-09-27: 80 mg via INTRAVENOUS

## 2023-09-27 MED ORDER — ONDANSETRON HCL 4 MG/2ML IJ SOLN
INTRAMUSCULAR | Status: DC | PRN
Start: 2023-09-27 — End: 2023-09-27
  Administered 2023-09-27: 4 mg via INTRAVENOUS

## 2023-09-27 MED ORDER — MIDAZOLAM HCL 5 MG/5ML IJ SOLN
INTRAMUSCULAR | Status: DC | PRN
  Administered 2023-09-27: 2 mg via INTRAVENOUS

## 2023-09-27 MED ORDER — LACTATED RINGERS IV SOLN: INTRAVENOUS | Status: DC

## 2023-09-27 MED ORDER — ACETAMINOPHEN 500 MG PO TABS
ORAL_TABLET | ORAL | Status: AC
  Filled 2023-09-27: qty 2

## 2023-09-27 MED ORDER — HYDROMORPHONE HCL 1 MG/ML IJ SOLN: 0.2500 mg | INTRAMUSCULAR | Status: DC | PRN

## 2023-09-27 MED ORDER — ONDANSETRON HCL 4 MG/2ML IJ SOLN
INTRAMUSCULAR | Status: AC
  Filled 2023-09-27: qty 2

## 2023-09-27 MED ORDER — BUPIVACAINE HCL (PF) 0.25 % IJ SOLN
INTRAMUSCULAR | Status: DC | PRN
  Administered 2023-09-27: 9 mL

## 2023-09-27 MED ORDER — CEFAZOLIN SODIUM-DEXTROSE 2-3 GM-%(50ML) IV SOLR
INTRAVENOUS | Status: DC | PRN
  Administered 2023-09-27: 2 g via INTRAVENOUS

## 2023-09-27 MED ORDER — GLYCOPYRROLATE 0.2 MG/ML IJ SOLN
INTRAMUSCULAR | Status: DC | PRN
  Administered 2023-09-27: .2 mg via INTRAVENOUS

## 2023-09-27 MED ORDER — FENTANYL CITRATE (PF) 100 MCG/2ML IJ SOLN
INTRAMUSCULAR | Status: AC
  Filled 2023-09-27: qty 2

## 2023-09-27 MED ORDER — FENTANYL CITRATE (PF) 100 MCG/2ML IJ SOLN
INTRAMUSCULAR | Status: DC | PRN
  Administered 2023-09-27 (×2): 50 ug via INTRAVENOUS

## 2023-09-27 MED ORDER — GLYCOPYRROLATE PF 0.2 MG/ML IJ SOSY
PREFILLED_SYRINGE | INTRAMUSCULAR | Status: AC
  Filled 2023-09-27: qty 1

## 2023-09-27 MED ORDER — KETOROLAC TROMETHAMINE 30 MG/ML IJ SOLN
INTRAMUSCULAR | Status: DC | PRN
  Administered 2023-09-27: 30 mg via INTRAVENOUS

## 2023-09-27 MED ORDER — MIDAZOLAM HCL 2 MG/2ML IJ SOLN
INTRAMUSCULAR | Status: AC
  Filled 2023-09-27: qty 2

## 2023-09-27 MED ORDER — 0.9 % SODIUM CHLORIDE (POUR BTL) OPTIME
TOPICAL | Status: DC | PRN
  Administered 2023-09-27: 400 mL

## 2023-09-27 MED ORDER — OXYCODONE-ACETAMINOPHEN 5-325 MG PO TABS: 1.0000 | ORAL_TABLET | Freq: Four times a day (QID) | ORAL | 0 refills | Status: DC | PRN

## 2023-09-27 MED ORDER — OXYCODONE HCL 5 MG/5ML PO SOLN: 5.0000 mg | Freq: Once | ORAL | Status: DC | PRN

## 2023-09-27 SURGICAL SUPPLY — 42 items
BANDAGE GAUZE 1X75IN STRL (MISCELLANEOUS) IMPLANT
BLADE MINI RND TIP GREEN BEAV (BLADE) IMPLANT
BLADE SURG 15 STRL LF DISP TIS (BLADE) ×2 IMPLANT
BNDG COHESIVE 1X5 TAN STRL LF (GAUZE/BANDAGES/DRESSINGS) IMPLANT
BNDG ELASTIC 2INX 5YD STR LF (GAUZE/BANDAGES/DRESSINGS) IMPLANT
BNDG ELASTIC 3INX 5YD STR LF (GAUZE/BANDAGES/DRESSINGS) IMPLANT
BNDG ESMARK 4X9 LF (GAUZE/BANDAGES/DRESSINGS) IMPLANT
BNDG GAUZE 1X75IN STRL (MISCELLANEOUS) IMPLANT
CHLORAPREP W/TINT 26 (MISCELLANEOUS) IMPLANT
CORD BIPOLAR FORCEPS 12FT (ELECTRODE) ×1 IMPLANT
COVER BACK TABLE 60X90IN (DRAPES) ×1 IMPLANT
COVER MAYO STAND STRL (DRAPES) ×1 IMPLANT
DRAIN PENROSE 12X.25 LTX STRL (MISCELLANEOUS) IMPLANT
DRAPE EXTREMITY T 121X128X90 (DISPOSABLE) ×1 IMPLANT
DRAPE OEC MINIVIEW 54X84 (DRAPES) IMPLANT
DRAPE SURG 17X23 STRL (DRAPES) ×1 IMPLANT
GAUZE SPONGE 4X4 12PLY STRL (GAUZE/BANDAGES/DRESSINGS) ×1 IMPLANT
GAUZE SPONGE 4X4 12PLY STRL LF (GAUZE/BANDAGES/DRESSINGS) IMPLANT
GAUZE XEROFORM 1X8 LF (GAUZE/BANDAGES/DRESSINGS) ×1 IMPLANT
GLOVE BIO SURGEON STRL SZ7.5 (GLOVE) ×1 IMPLANT
GLOVE BIOGEL PI IND STRL 8 (GLOVE) ×1 IMPLANT
GOWN STRL REUS W/ TWL LRG LVL3 (GOWN DISPOSABLE) ×1 IMPLANT
GOWN STRL REUS W/TWL XL LVL3 (GOWN DISPOSABLE) ×1 IMPLANT
NDL HYPO 25X1 1.5 SAFETY (NEEDLE) IMPLANT
NEEDLE HYPO 25X1 1.5 SAFETY (NEEDLE) ×1 IMPLANT
NS IRRIG 1000ML POUR BTL (IV SOLUTION) ×1 IMPLANT
PACK BASIN DAY SURGERY FS (CUSTOM PROCEDURE TRAY) ×1 IMPLANT
PADDING CAST ABS COTTON 4X4 ST (CAST SUPPLIES) ×1 IMPLANT
SPIKE FLUID TRANSFER (MISCELLANEOUS) IMPLANT
SPLINT FINGER 3.25 911903 (SOFTGOODS) IMPLANT
STOCKINETTE 4X48 STRL (DRAPES) ×1 IMPLANT
SUT CHROMIC 4 0 P 3 18 (SUTURE) IMPLANT
SUT ETHILON 3 0 PS 1 (SUTURE) IMPLANT
SUT ETHILON 4 0 PS 2 18 (SUTURE) IMPLANT
SUT MERSILENE 4 0 P 3 (SUTURE) IMPLANT
SUT MON AB 5-0 P3 18 (SUTURE) IMPLANT
SUT VIC AB 4-0 P-3 18XBRD (SUTURE) IMPLANT
SYR BULB EAR ULCER 3OZ GRN STR (SYRINGE) ×1 IMPLANT
SYR CONTROL 10ML LL (SYRINGE) IMPLANT
TOWEL GREEN STERILE FF (TOWEL DISPOSABLE) ×1 IMPLANT
TRAY DSU PREP LF (CUSTOM PROCEDURE TRAY) ×1 IMPLANT
UNDERPAD 30X36 HEAVY ABSORB (UNDERPADS AND DIAPERS) ×1 IMPLANT

## 2023-09-27 NOTE — Op Note (Addendum)
 NAME: [name] MEDICAL RECORD NO: [id number] DATE OF BIRTH: 1975/11/28 FACILITY: [facility] LOCATION: [facility] PHYSICIAN: Tami Ribas, MD   OPERATIVE REPORT   DATE OF PROCEDURE: 09/27/23    PREOPERATIVE DIAGNOSIS: Right index finger ischemic necrosis   POSTOPERATIVE DIAGNOSIS: Right index finger ischemic necrosis   PROCEDURE: Right index finger amputation through middle phalanx   SURGEON:  Betha Loa, M.D.   ASSISTANT: none   ANESTHESIA:  General   INTRAVENOUS FLUIDS:  Per anesthesia flow sheet.   ESTIMATED BLOOD LOSS:  Minimal.   COMPLICATIONS:  None.   SPECIMENS: Right index fingertip to pathology for gross exam   TOURNIQUET TIME:   Right arm: Approximately 20 minutes at 250 mmHg   DISPOSITION:  Stable to PACU.   INDICATIONS: 48 year old female with history of ischemic necrosis of multiple digits.  She has had previous amputation of right index finger distally for the same.  She has nonhealing wound that is painful.  She wishes to have further amputation to try to achieve healing of this digit.  Risks, benefits and alternatives of surgery were discussed including the risks of blood loss, infection, damage to nerves, vessels, tendons, ligaments, bone for surgery, need for additional surgery, complications with wound healing, continued pain, stiffness, need for further amputation.  She voiced understanding of these risks and elected to proceed.  OPERATIVE COURSE:  After being identified preoperatively by myself,  the patient and I agreed on the procedure and site of the procedure.  The surgical site was marked.  Surgical consent had been signed. Antibiotics were held for intraoperative cultures. She was transferred to the operating room and placed on the operating table in supine position with the Right upper extremity on an arm board.  General anesthesia was induced by the anesthesiologist.  Right upper extremity was prepped and draped in normal  sterile orthopedic fashion.  A surgical pause was performed between the surgeons, anesthesia, and operating room staff and all were in agreement as to the patient, procedure, and site of procedure.  Tourniquet at the proximal aspect of the extremity was inflated to 250 mmHg after exsanguination of the arm with an Esmarch bandage.  Cultures taken of the wound where there is small amount of drainage.  She was then given IV Ancef.  A fishmouth style incision was made through viable appearing skin.  The radial and ulnar digital nerve and artery were identified.  The digital nerves were placed under traction bipolar and allowed to retract.  The digital arteries were bipolared.  Soft tissues were released.  Remaining FDP tendon was released.  The bone cutter was used to amputate the finger through the distal aspect of the middle phalanx.  The bone ends were rounded with the rongeurs.  Wound was copiously irrigated with sterile saline.  The skin was able to be brought over the end of the bone without tension.  5-0 Monocryl suture was used in interrupted fashion to reapproximate skin edges.  Wound was then dressed with sterile Xeroform 4 x 4 and wrapped with a Coban dressing lightly.  AlumaFoam splint was placed and wrapped lightly with Coban dressing.  Digital block was performed with quarter percent plain Marcaine to aid in postoperative analgesia.  The tourniquet was deflated at approximately 20 minutes.  Fingertips were pink with brisk capillary refill after deflation of tourniquet.  The operative  drapes were broken down.  The patient was awoken from anesthesia safely.  She was transferred back to the stretcher and  taken to PACU in stable condition.  I will see her back in the office in 1 week for postoperative followup.  I will give her a prescription for Percocet 5/325 1 tab PO q6 hours prn pain, dispense # 20 and doxycycline 10 milligrams p.o. twice daily x 7 days.   Betha Loa, MD Electronically signed,  09/27/23

## 2023-09-27 NOTE — Transfer of Care (Signed)
 Immediate Anesthesia Transfer of Care Note  Patient: [name]  Procedure(s) Performed: AMPUTATION RIGHT INDEX FINGER (Right: Finger)  Patient Location: PACU  Anesthesia Type:General  Level of Consciousness: drowsy and patient cooperative  Airway & Oxygen Therapy: Patient Spontanous Breathing and Patient connected to face mask oxygen  Post-op Assessment: Report given to RN and Post -op Vital signs reviewed and stable  Post vital signs: Reviewed and stable  Last Vitals:  Vitals Value Taken Time  BP 141/90 09/27/23 1628  Temp 36.1 C 09/27/23 1628  Pulse 75 09/27/23 1628  Resp 12 09/27/23 1629  SpO2 100 % 09/27/23 1628  Vitals shown include unfiled device data.  Last Pain:  Vitals:   09/27/23 1628  TempSrc:   PainSc: Asleep      Patients Stated Pain Goal: 5 (09/27/23 1358)  Complications: No notable events documented.

## 2023-09-27 NOTE — Discharge Instructions (Addendum)
 [facility] Instructions Hand Surgery  Wound Care: Keep your hand elevated above the level of your heart.  Do not allow it to dangle by your side.  Keep the dressing dry and do not remove it unless your doctor advises you to do so.  He will usually change it at the time of your post-op visit.  Moving your fingers is advised to stimulate circulation but will depend on the site of your surgery.  If you have a splint applied, your doctor will advise you regarding movement.  Activity: Do not drive or operate machinery today.  Rest today and then you may return to your normal activity and work as indicated by your physician.  Diet:  Drink liquids today or eat a light diet.  You may resume a regular diet tomorrow.    General expectations: Pain for two to three days. Fingers may become slightly swollen.  Call your doctor if any of the following occur: Severe pain not relieved by pain medication. Elevated temperature. Dressing soaked with blood. Inability to move fingers. White or bluish color to fingers.   No Tylenol before 8:30pm tonight. No Ibuprofen/NSAIDS until after 12:30am 09/28/23   Post Anesthesia Home Care Instructions  Activity: Get plenty of rest for the remainder of the day. A responsible individual must stay with you for 24 hours following the procedure.  For the next 24 hours, DO NOT: -Drive a car -Advertising copywriter -Drink alcoholic beverages -Take any medication unless instructed by your physician -Make any legal decisions or sign important papers.  Meals: Start with liquid foods such as gelatin or soup. Progress to regular foods as tolerated. Avoid greasy, spicy, heavy foods. If nausea and/or vomiting occur, drink only clear liquids until the nausea and/or vomiting subsides. Call your physician if vomiting continues.  Special Instructions/Symptoms: Your throat may feel dry or sore from the anesthesia or the breathing tube placed in your throat during surgery. If this  causes discomfort, gargle with warm salt water. The discomfort should disappear within 24 hours.  If you had a scopolamine patch placed behind your ear for the management of post- operative nausea and/or vomiting:  1. The medication in the patch is effective for 72 hours, after which it should be removed.  Wrap patch in a tissue and discard in the trash. Wash hands thoroughly with soap and water. 2. You may remove the patch earlier than 72 hours if you experience unpleasant side effects which may include dry mouth, dizziness or visual disturbances. 3. Avoid touching the patch. Wash your hands with soap and water after contact with the patch.

## 2023-09-27 NOTE — Anesthesia Procedure Notes (Signed)
 Procedure Name: LMA Insertion Date/Time: 09/27/2023 3:43 PM  Performed by: Yolanda Bonine, CRNAPre-anesthesia Checklist: Patient identified, Emergency Drugs available, Suction available, Patient being monitored and Timeout performed Patient Re-evaluated:Patient Re-evaluated prior to induction Oxygen Delivery Method: Circle system utilized Preoxygenation: Pre-oxygenation with 100% oxygen Induction Type: IV induction Ventilation: Mask ventilation without difficulty LMA: LMA with gastric port inserted LMA Size: 4.0 Number of attempts: 1 Placement Confirmation: positive ETCO2 Dental Injury: Teeth and Oropharynx as per pre-operative assessment

## 2023-09-27 NOTE — H&P (Signed)
 [name] is an 48 y.o. female.   Chief Complaint: ischemic necrosis HPI: 48 yo female with ischemic necrosis of right index finger.  Has had partial amputation of digit.  The wound has failed to heal and is painful.  She wishes to proceed with further amputation of the digit to try to achieve healing.  Allergies: No Known Allergies  Past Medical History:  Diagnosis Date   Anxiety    History of cervical dysplasia    20 yrs ago    Hypertension    Hypothyroidism    PTSD (post-traumatic stress disorder)    Raynaud disease    Sarcoma (HCC)     Past Surgical History:  Procedure Laterality Date   AMPUTATION Left 05/24/2023   Procedure: revision amputation left small finger;  Surgeon: Betha Loa, MD;  Location: [facility];  Service: Orthopedics;  Laterality: Left;  time need 45 minutes   AMPUTATION Bilateral 07/16/2023   Procedure: revision amputation with incision and drainage left ring finger and right index finger;  Surgeon: Betha Loa, MD;  Location: [facility];  Service: Orthopedics;  Laterality: Bilateral;   AMPUTATION Left 09/07/2023   Procedure: AMPUTATION DIGIT LEFT THUMB;  Surgeon: Betha Loa, MD;  Location: [facility];  Service: Orthopedics;  Laterality: Left;   APPENDECTOMY     CESAREAN SECTION     INCISION AND DRAINAGE Bilateral 07/16/2023   Procedure: INCISION AND DRAINAGE OF LEFT RING AND RIGHT INDEX FINGERS;  Surgeon: Betha Loa, MD;  Location: [facility];  Service: Orthopedics;  Laterality: Bilateral;   LYMPH NODE BIOPSY      Family History: Family History  Problem Relation Age of Onset   Hypertension Mother    Breast cancer Mother    Diabetes Father    Hypertension Father    Breast cancer Paternal Grandmother     Social History:   reports that she quit smoking about 3 months ago. Her smoking use included cigarettes. She has never used smokeless tobacco. She reports that she does not  currently use alcohol. She reports that she does not use drugs.  Medications: Medications Prior to Admission  Medication Sig Dispense Refill   amLODipine (NORVASC) 2.5 MG tablet Take 2.5 mg by mouth daily.     aspirin EC 81 MG tablet Take 1 tablet (81 mg total) by mouth daily. Swallow whole. 30 tablet 12   atorvastatin (LIPITOR) 20 MG tablet Take 1 tablet (20 mg total) by mouth daily. (Patient not taking: Reported on 08/31/2023) 30 tablet 2   doxycycline (VIBRAMYCIN) 50 MG capsule Take 2 capsules (100 mg total) by mouth 2 (two) times daily. 28 capsule 0   hydrOXYzine (VISTARIL) 25 MG capsule Take 25 mg by mouth 3 (three) times daily as needed.     meloxicam (MOBIC) 15 MG tablet Take 15 mg by mouth daily.     ondansetron (ZOFRAN-ODT) 4 MG disintegrating tablet Take 1 tablet (4 mg total) by mouth every 8 (eight) hours as needed for nausea or vomiting. 20 tablet 0   oxyCODONE-acetaminophen (PERCOCET) 5-325 MG tablet 1-2 tabs po q6 hours prn pain 20 tablet 0   promethazine (PHENERGAN) 25 MG suppository Place 1 suppository (25 mg total) rectally every 6 (six) hours as needed for nausea or vomiting. 12 each 0   traZODone (DESYREL) 50 MG tablet Take 1 tablet (50 mg total) by mouth at bedtime as needed for sleep. 30 tablet 0    No results found for this or  any previous visit (from the past 48 hours).  No results found.    Last menstrual period 05/10/2022.  General appearance: alert, cooperative, and appears stated age Head: Normocephalic, without obvious abnormality, atraumatic Neck: supple, symmetrical, trachea midline Extremities:right index finger with non healing wound at tip from previous amputation.  No erythema. Skin: Skin color, texture, turgor normal. No rashes or lesions Neurologic: Grossly normal Incision/Wound: as above  Assessment/Plan Right index finger ischemic necrosis with non healing wound.  Plan amputation through dip joint or distal middle phalanx.  Non operative and  operative treatment options have been discussed with the patient and patient wishes to proceed with operative treatment. Risks, benefits and alternatives of surgery were discussed including risks of blood loss, infection, damage to nerves/vessels/tendons/ligament/bone, failure of surgery, need for additional surgery, complication with wound healing, stiffness, need for repeat irrigation and debridement, amputation.  She voiced understanding of these risks and elected to proceed.    Betha Loa 09/27/2023, 1:52 PM

## 2023-09-27 NOTE — Anesthesia Preprocedure Evaluation (Addendum)
 Anesthesia Evaluation  Patient identified by MRN, date of birth, ID band Patient awake    Reviewed: Allergy & Precautions, NPO status , Patient's Chart, lab work & pertinent test results  Airway Mallampati: II  TM Distance: >3 FB Neck ROM: Full    Dental  (+) Dental Advisory Given, Teeth Intact   Pulmonary former smoker   breath sounds clear to auscultation       Cardiovascular hypertension, Pt. on medications  Rhythm:Regular Rate:Normal     Neuro/Psych  PSYCHIATRIC DISORDERS Anxiety     Hx of PTSDnegative neurological ROS     GI/Hepatic negative GI ROS, Neg liver ROS,,,  Endo/Other  Hypothyroidism    Renal/GU negative Renal ROS     Musculoskeletal   Abdominal   Peds  Hematology negative hematology ROS (+)   Anesthesia Other Findings   Reproductive/Obstetrics                             Anesthesia Physical Anesthesia Plan  ASA: 2  Anesthesia Plan: General   Post-op Pain Management: Toradol IV (intra-op)* and Tylenol PO (pre-op)*   Induction: Intravenous  PONV Risk Score and Plan: 3 and Dexamethasone, Ondansetron, Midazolam and Treatment may vary due to age or medical condition  Airway Management Planned: LMA  Additional Equipment: None  Intra-op Plan:   Post-operative Plan: Extubation in OR  Informed Consent:      Dental advisory given  Plan Discussed with: CRNA and Surgeon  Anesthesia Plan Comments:        Anesthesia Quick Evaluation

## 2023-09-27 NOTE — Anesthesia Postprocedure Evaluation (Signed)
 Anesthesia Post Note  Patient: [name]  Procedure(s) Performed: AMPUTATION RIGHT INDEX FINGER (Right: Finger)     Patient location during evaluation: PACU Anesthesia Type: General Level of consciousness: awake and alert Pain management: pain level controlled Vital Signs Assessment: post-procedure vital signs reviewed and stable Respiratory status: spontaneous breathing, nonlabored ventilation, respiratory function stable and patient connected to nasal cannula oxygen Cardiovascular status: blood pressure returned to baseline and stable Postop Assessment: no apparent nausea or vomiting Anesthetic complications: no  No notable events documented.  Last Vitals:  Vitals:   09/27/23 1645 09/27/23 1707  BP: (!) 117/90 (!) 144/97  Pulse: 76 68  Resp: 13 16  Temp:  36.6 C  SpO2: 96% 100%    Last Pain:  Vitals:   09/27/23 1707  TempSrc:   PainSc: 0-No pain                 Trevor Iha

## 2023-09-28 ENCOUNTER — Encounter (HOSPITAL_BASED_OUTPATIENT_CLINIC_OR_DEPARTMENT_OTHER): Payer: Self-pay | Admitting: Orthopedic Surgery

## 2023-10-01 LAB — SURGICAL PATHOLOGY

## 2023-10-02 LAB — AEROBIC/ANAEROBIC CULTURE W GRAM STAIN (SURGICAL/DEEP WOUND): Gram Stain: NONE SEEN

## 2023-10-08 ENCOUNTER — Ambulatory Visit: Payer: MEDICAID | Admitting: Occupational Therapy

## 2023-10-08 ENCOUNTER — Other Ambulatory Visit: Payer: Self-pay

## 2023-10-08 DIAGNOSIS — M6281 Muscle weakness (generalized): Secondary | ICD-10-CM | POA: Diagnosis not present

## 2023-10-08 DIAGNOSIS — R208 Other disturbances of skin sensation: Secondary | ICD-10-CM

## 2023-10-08 DIAGNOSIS — R278 Other lack of coordination: Secondary | ICD-10-CM | POA: Diagnosis not present

## 2023-10-08 DIAGNOSIS — I998 Other disorder of circulatory system: Secondary | ICD-10-CM | POA: Diagnosis present

## 2023-10-08 DIAGNOSIS — I7301 Raynaud's syndrome with gangrene: Secondary | ICD-10-CM | POA: Diagnosis present

## 2023-10-08 NOTE — Therapy (Signed)
 OUTPATIENT OCCUPATIONAL THERAPY NEURO EVALUATION  Patient Name: [name] MRN: [id number] DOB:1976-07-02, 48 y.o., female Today's Date: 10/08/2023  PCP: [facility]  REFERRING PROVIDER: Reubin Milan, PA-C   END OF SESSION:  OT End of Session - 10/08/23 1617     Visit Number 1    Number of Visits 1    Date for OT Re-Evaluation 10/08/23    Authorization Type [employer] 2025    OT Start Time 1320    OT Stop Time 1350    OT Time Calculation (min) 30 min    Activity Tolerance Patient limited by pain    Behavior During Therapy Lability             Past Medical History:  Diagnosis Date   Anxiety    History of cervical dysplasia    20 yrs ago    Hypertension    Hypothyroidism    PTSD (post-traumatic stress disorder)    Raynaud disease    Sarcoma (HCC)    Past Surgical History:  Procedure Laterality Date   AMPUTATION Left 05/24/2023   Procedure: revision amputation left small finger;  Surgeon: Betha Loa, MD;  Location: [facility];  Service: Orthopedics;  Laterality: Left;  time need 45 minutes   AMPUTATION Bilateral 07/16/2023   Procedure: revision amputation with incision and drainage left ring finger and right index finger;  Surgeon: Betha Loa, MD;  Location: [facility];  Service: Orthopedics;  Laterality: Bilateral;   AMPUTATION Left 09/07/2023   Procedure: AMPUTATION DIGIT LEFT THUMB;  Surgeon: Betha Loa, MD;  Location: [facility];  Service: Orthopedics;  Laterality: Left;   AMPUTATION FINGER Right 09/27/2023   Procedure: AMPUTATION RIGHT INDEX FINGER;  Surgeon: Betha Loa, MD;  Location: [facility];  Service: Orthopedics;  Laterality: Right;  PARTIAL AMPUTATION LEFT INDEX FINGER   APPENDECTOMY     CESAREAN SECTION     INCISION AND DRAINAGE Bilateral 07/16/2023   Procedure: INCISION AND DRAINAGE OF LEFT RING AND RIGHT INDEX FINGERS;  Surgeon: Betha Loa, MD;  Location:   SURGERY CENTER;  Service: Orthopedics;  Laterality: Bilateral;   LYMPH NODE BIOPSY     Patient Active Problem List   Diagnosis Date Noted   Alcohol dependence (HCC) 03/29/2023   Alcohol withdrawal (HCC) 04/13/2021   Alcohol use disorder, severe, dependence (HCC) 04/12/2021    ONSET DATE: 09/19/23 (referral date), partial amputation L thumb s/p 09/07/23, revision R index finger amputation through middle phalanx s/p 09/27/23  REFERRING DIAG:  I73.01 (ICD-10-CM) - Raynaud's syndrome with gangrene  I99.8 (ICD-10-CM) - Ischemia of finger    THERAPY DIAG:  Other lack of coordination  Other disturbances of skin sensation  Muscle weakness (generalized)  Rationale for Evaluation and Treatment: Rehabilitation  SUBJECTIVE:   SUBJECTIVE STATEMENT: Pt reported upcoming appointment with PCP tomorrow. Pt reported not taking any medications and OT updated medication list accordingly. Pt reported increased pain today possibly d/t discontinuation of pain medication.  Pt tearful throughout session. 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  Pt reported first amputation of digits took place in November 2024 with several revisions and additional digit amputations since then. Pt reported first onset of sx in 2011 "my fingertip busted." Pt reported not attending f/u appointment yet with surgeon for postoperative f/u.   Pt reported "Therapy is not going to do anything with my fingers falling off like  this."  Pt accompanied by: self  PERTINENT HISTORY: history of ischemic necrosis of multiple digits, hx of nonhealing wounds with pain, revision R index finger amputation through middle phalanx s/p 09/27/23, partial amputation L thumb s/p 09/07/23 secondary for ischemia and gangrene... 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 She  states she is stopped smoking and has stopped drinking caffeine."  PRECAUTIONS: Per pt's understanding of instructions from surgeon: Pt's doctor told pt "not to do anything."   WEIGHT BEARING RESTRICTIONS: Unknown  PAIN:  Are you having pain? Yes: NPRS scale: 10/10 pain Pain location: R pointer finger proximal phalanx Pain description: extending fingers leads to shooting pain Aggravating factors: extending fingers Relieving factors: heat  FALLS: Has patient fallen in last 6 months? No  LIVING ENVIRONMENT: Lives with: lives with their family Lives in: House/apartment Stairs: Yes: External: a few steps;    PLOF: Previous occupation: hair stylist  PATIENT GOALS: not identified, see today's tx below  OBJECTIVE:  Note: Objective measures were completed at Evaluation unless otherwise noted.  OBSERVATIONS: Pt was pleasant throughout eval and appeared well-kept. Pt immediately tearful upon entering evaluation session. Pt recovered during initial gathering of information though became tearful again when discussing current condition and deficits.   OT noted several of pt's fingertips appeared pink with some digits lacking fingernails. Pt arrived with L thumb and ring finger in Coban wrap and R index finger in Coban wrap.   Sites of amputation: Pointer finger, pinky on R hand. Ring finger and thumb on L hand. Pt pointed out thenar eminence. OT noted substantially decreased muscle tone at thenar eminence BUE. Pt reported caring for amputation sites with saline solution and wrapping in Coban wrap daily. OT provided pt with replacement Coban wrap today.  L ring finger - darker skin around surgical site, some yellow-green crust around edges of wound. L thumb - healing surgical site, small disposable gauze strip present. R pointer finger - stitches still present, appears to be healing well.   Pt reported process leading up to amputations: "skin peels, fingernail goes," then "totally gangrene." Pt  expressed concerns that other digits were at different stages of this process and pointed out peeling skin on one digit and pink tone and lack of fingernails on other digits. Pt reported feeling very concerned about continued need for amputations but also about potential for infection if left untreated.                                                                                                                            TREATMENT DATE: 10/08/23    Pt tearful throughout session likely d/t frustration and fearfulness about situation and concerns about prognosis.  OT recommended to pt to schedule post-op f/u with surgeon. Pt verbalized understanding.  OT educated pt on OT role and potential goals for therapy, such as ROM, desensitization, and adaptive strategies for ADL/IADL tasks.  Pt tearful throughout session. OT provided therapeutic listening. After discussion, pt requested to hold therapy at this time until pt can f/u with MD to determine prognosis and next course of action, potentially surgical options. Pt reported "Therapy is not going to do anything with my fingers falling off like this."   OT educated pt that a new referral will be required though pt can return when pt feels ready to participate in therapy. Pt verbalized understanding. Pt expressed concerns about continued need for amputations but also about potential for infection if left untreated. OT recommended to pt to f/u with MD about all concerns and potentially ask about mental health support resources, e.g. counseling, from MD or by checking payer source website or calling payer source for options. Pt verbalized understanding. OT offered option of receiving information from support group for individuals with amputations through [facility] and pt agreeable. Pt provided OT with preferred email address to provide to support group representative.  OT educated pt briefly on ROM by moving joints of BUE, desensitization, and recommended to  continue to f/u with appropriate medical providers to address concerns. Pt acknowledged understanding of all.   PATIENT EDUCATION: Education details: see today's tx above Person educated: Patient Education method: Explanation Education comprehension: verbalized understanding  HOME EXERCISE PROGRAM: N/A   GOALS: Goals reviewed with patient? No  SHORT TERM GOALS: N/A  LONG TERM GOALS: N/A  ASSESSMENT:  CLINICAL IMPRESSION: Patient is a 48 y.o. female who was seen today for occupational therapy evaluation for Raynaud's syndrome with gangrene and ischemia of finger. 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 Patient currently presents below baseline level of functioning demonstrating functional deficits and impairments as noted below. However, following discussion with pt, pt requested to hold OT at this time until pt obtains further information from doctors and surgeons about prognosis, dx, and ongoing/evolving medical management of symptoms. Therefore, OT eval only today. Pt tearful throughout evaluation and expressed concerns about continued need for amputations but also about potential for infection if left untreated. Pt may return to OT if a new referral is received. Pt verbalized understanding.   PERFORMANCE DEFICITS: in functional skills including ADLs, IADLs, coordination, dexterity, proprioception, sensation, edema, ROM, strength, pain, flexibility, Fine motor control, Gross motor control, mobility, body mechanics, endurance, skin integrity, and UE functional use, cognitive skills including emotional and energy/drive, and psychosocial skills including environmental adaptation.   IMPAIRMENTS:  are limiting patient from ADLs, IADLs, rest and sleep, work, leisure, and social participation.   CO-MORBIDITIES: has co-morbidities such as Raynaud's syndrome with gangrene and ischemia of finger  that affects occupational performance. Patient will benefit from skilled OT to address above impairments and improve overall function.  MODIFICATION OR ASSISTANCE TO COMPLETE EVALUATION: Min-Moderate modification of tasks or assist with assess necessary to complete an evaluation.  OT OCCUPATIONAL PROFILE AND HISTORY: Detailed assessment: Review of records and additional review of physical, cognitive, psychosocial history related to current functional performance.  CLINICAL DECISION MAKING: Moderate - several treatment options, min-mod task modification necessary  REHAB POTENTIAL: Fair ongoing and evolving symptoms  EVALUATION COMPLEXITY: Moderate    PLAN:  OT FREQUENCY: N/A - eval only. Pt requested to hold therapy at this time  OT DURATION: N/A - eval only. Pt requested  to hold therapy at this time  PLANNED INTERVENTIONS: N/A - eval only  RECOMMENDED OTHER SERVICES: recommended to pt to f/u with MD, surgeon, and mental health resources about concerns. Pt verbalized understanding.  CONSULTED AND AGREED WITH PLAN OF CARE: Patient  PLAN FOR NEXT SESSION:  N/A- eval only   Wynetta Emery, OT 10/08/2023, 4:53 PM

## 2023-10-15 ENCOUNTER — Other Ambulatory Visit: Payer: Self-pay | Admitting: Orthopedic Surgery

## 2023-10-17 LAB — SURGICAL PATHOLOGY

## 2023-12-13 ENCOUNTER — Encounter (HOSPITAL_BASED_OUTPATIENT_CLINIC_OR_DEPARTMENT_OTHER): Payer: Self-pay | Admitting: Orthopedic Surgery

## 2023-12-13 ENCOUNTER — Other Ambulatory Visit: Payer: Self-pay

## 2023-12-13 ENCOUNTER — Ambulatory Visit (HOSPITAL_BASED_OUTPATIENT_CLINIC_OR_DEPARTMENT_OTHER): Payer: MEDICAID | Admitting: Anesthesiology

## 2023-12-13 ENCOUNTER — Encounter (HOSPITAL_BASED_OUTPATIENT_CLINIC_OR_DEPARTMENT_OTHER)
Admission: RE | Disposition: A | Discharge status: Home / Self Care | Payer: Self-pay | Source: Home / Self Care | Attending: Orthopedic Surgery

## 2023-12-13 ENCOUNTER — Other Ambulatory Visit: Payer: Self-pay | Admitting: Orthopedic Surgery

## 2023-12-13 ENCOUNTER — Ambulatory Visit (HOSPITAL_BASED_OUTPATIENT_CLINIC_OR_DEPARTMENT_OTHER)
Admission: RE | Admit: 2023-12-13 | Discharge: 2023-12-13 | Disposition: A | Discharge status: Home / Self Care | Payer: MEDICAID | Attending: Orthopedic Surgery | Admitting: Orthopedic Surgery

## 2023-12-13 DIAGNOSIS — Z89022 Acquired absence of left finger(s): Secondary | ICD-10-CM | POA: Diagnosis not present

## 2023-12-13 DIAGNOSIS — F419 Anxiety disorder, unspecified: Secondary | ICD-10-CM | POA: Insufficient documentation

## 2023-12-13 DIAGNOSIS — Z87891 Personal history of nicotine dependence: Secondary | ICD-10-CM | POA: Insufficient documentation

## 2023-12-13 DIAGNOSIS — T8189XA Other complications of procedures, not elsewhere classified, initial encounter: Secondary | ICD-10-CM | POA: Insufficient documentation

## 2023-12-13 DIAGNOSIS — I998 Other disorder of circulatory system: Secondary | ICD-10-CM | POA: Diagnosis present

## 2023-12-13 DIAGNOSIS — Z89012 Acquired absence of left thumb: Secondary | ICD-10-CM | POA: Insufficient documentation

## 2023-12-13 DIAGNOSIS — Z89021 Acquired absence of right finger(s): Secondary | ICD-10-CM | POA: Insufficient documentation

## 2023-12-13 DIAGNOSIS — I7300 Raynaud's syndrome without gangrene: Secondary | ICD-10-CM | POA: Diagnosis not present

## 2023-12-13 DIAGNOSIS — S61204A Unspecified open wound of right ring finger without damage to nail, initial encounter: Secondary | ICD-10-CM

## 2023-12-13 DIAGNOSIS — E039 Hypothyroidism, unspecified: Secondary | ICD-10-CM | POA: Diagnosis not present

## 2023-12-13 DIAGNOSIS — X58XXXA Exposure to other specified factors, initial encounter: Secondary | ICD-10-CM | POA: Insufficient documentation

## 2023-12-13 DIAGNOSIS — Z791 Long term (current) use of non-steroidal anti-inflammatories (NSAID): Secondary | ICD-10-CM | POA: Diagnosis not present

## 2023-12-13 DIAGNOSIS — Z79899 Other long term (current) drug therapy: Secondary | ICD-10-CM | POA: Insufficient documentation

## 2023-12-13 DIAGNOSIS — Z833 Family history of diabetes mellitus: Secondary | ICD-10-CM | POA: Diagnosis not present

## 2023-12-13 DIAGNOSIS — Z7982 Long term (current) use of aspirin: Secondary | ICD-10-CM | POA: Diagnosis not present

## 2023-12-13 DIAGNOSIS — I10 Essential (primary) hypertension: Secondary | ICD-10-CM | POA: Diagnosis not present

## 2023-12-13 HISTORY — PX: AMPUTATION FINGER: SHX6594

## 2023-12-13 SURGERY — AMPUTATION, FINGER
Anesthesia: General | Site: Ring Finger | Laterality: Right

## 2023-12-13 MED ORDER — ONDANSETRON HCL 4 MG/2ML IJ SOLN
INTRAMUSCULAR | Status: AC
  Filled 2023-12-13: qty 2

## 2023-12-13 MED ORDER — BUPIVACAINE HCL (PF) 0.25 % IJ SOLN
INTRAMUSCULAR | Status: DC | PRN
Start: 2023-12-13 — End: 2023-12-13
  Administered 2023-12-13: 9 mL

## 2023-12-13 MED ORDER — ACETAMINOPHEN 500 MG PO TABS
1000.0000 mg | ORAL_TABLET | Freq: Four times a day (QID) | ORAL | Status: DC | PRN
  Administered 2023-12-13: 1000 mg via ORAL

## 2023-12-13 MED ORDER — 0.9 % SODIUM CHLORIDE (POUR BTL) OPTIME
TOPICAL | Status: DC | PRN
  Administered 2023-12-13: 100 mL

## 2023-12-13 MED ORDER — MIDAZOLAM HCL 5 MG/5ML IJ SOLN
INTRAMUSCULAR | Status: DC | PRN
  Administered 2023-12-13: 2 mg via INTRAVENOUS

## 2023-12-13 MED ORDER — MIDAZOLAM HCL 2 MG/2ML IJ SOLN
INTRAMUSCULAR | Status: AC
  Filled 2023-12-13: qty 2

## 2023-12-13 MED ORDER — CEFAZOLIN SODIUM-DEXTROSE 2-3 GM-%(50ML) IV SOLR
INTRAVENOUS | Status: DC | PRN
  Administered 2023-12-13: 2 g via INTRAVENOUS

## 2023-12-13 MED ORDER — FENTANYL CITRATE (PF) 100 MCG/2ML IJ SOLN
100.0000 ug | Freq: Once | INTRAMUSCULAR | Status: AC
  Administered 2023-12-13 (×2): 50 ug via INTRAVENOUS

## 2023-12-13 MED ORDER — KETOROLAC TROMETHAMINE 30 MG/ML IJ SOLN
INTRAMUSCULAR | Status: DC | PRN
  Administered 2023-12-13: 30 mg via INTRAVENOUS

## 2023-12-13 MED ORDER — ACETAMINOPHEN 500 MG PO TABS
ORAL_TABLET | ORAL | Status: AC
  Filled 2023-12-13: qty 2

## 2023-12-13 MED ORDER — DEXAMETHASONE SODIUM PHOSPHATE 10 MG/ML IJ SOLN
INTRAMUSCULAR | Status: DC | PRN
  Administered 2023-12-13: 10 mg via INTRAVENOUS

## 2023-12-13 MED ORDER — FENTANYL CITRATE (PF) 100 MCG/2ML IJ SOLN
INTRAMUSCULAR | Status: DC | PRN
  Administered 2023-12-13: 25 ug via INTRAVENOUS

## 2023-12-13 MED ORDER — ONDANSETRON HCL 4 MG/2ML IJ SOLN
INTRAMUSCULAR | Status: DC | PRN
  Administered 2023-12-13: 4 mg via INTRAVENOUS

## 2023-12-13 MED ORDER — PROPOFOL 10 MG/ML IV BOLUS
INTRAVENOUS | Status: DC | PRN
Start: 2023-12-13 — End: 2023-12-13
  Administered 2023-12-13: 200 mg via INTRAVENOUS

## 2023-12-13 MED ORDER — LACTATED RINGERS IV SOLN: INTRAVENOUS | Status: DC

## 2023-12-13 MED ORDER — KETOROLAC TROMETHAMINE 30 MG/ML IJ SOLN
INTRAMUSCULAR | Status: AC
  Filled 2023-12-13: qty 1

## 2023-12-13 MED ORDER — FENTANYL CITRATE (PF) 100 MCG/2ML IJ SOLN
INTRAMUSCULAR | Status: AC
  Filled 2023-12-13: qty 2

## 2023-12-13 MED ORDER — OXYCODONE-ACETAMINOPHEN 5-325 MG PO TABS: 1.0000 | ORAL_TABLET | Freq: Four times a day (QID) | ORAL | 0 refills | Status: DC | PRN

## 2023-12-13 SURGICAL SUPPLY — 42 items
BANDAGE GAUZE 1X75IN STRL (MISCELLANEOUS) IMPLANT
BLADE MINI RND TIP GREEN BEAV (BLADE) IMPLANT
BLADE SURG 15 STRL LF DISP TIS (BLADE) ×2 IMPLANT
BNDG COHESIVE 1X5 TAN STRL LF (GAUZE/BANDAGES/DRESSINGS) IMPLANT
BNDG COMPR ESMARK 4X3 LF (GAUZE/BANDAGES/DRESSINGS) IMPLANT
BNDG ELASTIC 2INX 5YD STR LF (GAUZE/BANDAGES/DRESSINGS) IMPLANT
BNDG ELASTIC 3INX 5YD STR LF (GAUZE/BANDAGES/DRESSINGS) IMPLANT
BNDG ESMARK 4X9 LF (GAUZE/BANDAGES/DRESSINGS) IMPLANT
CHLORAPREP W/TINT 26 (MISCELLANEOUS) IMPLANT
CORD BIPOLAR FORCEPS 12FT (ELECTRODE) ×1 IMPLANT
COVER BACK TABLE 60X90IN (DRAPES) ×1 IMPLANT
COVER MAYO STAND STRL (DRAPES) ×1 IMPLANT
DRAIN PENROSE 12X.25 LTX STRL (MISCELLANEOUS) IMPLANT
DRAPE EXTREMITY T 121X128X90 (DISPOSABLE) ×1 IMPLANT
DRAPE OEC MINIVIEW 54X84 (DRAPES) IMPLANT
DRAPE SURG 17X23 STRL (DRAPES) ×1 IMPLANT
GAUZE SPONGE 4X4 12PLY STRL (GAUZE/BANDAGES/DRESSINGS) ×1 IMPLANT
GAUZE SPONGE 4X4 12PLY STRL LF (GAUZE/BANDAGES/DRESSINGS) IMPLANT
GAUZE XEROFORM 1X8 LF (GAUZE/BANDAGES/DRESSINGS) ×1 IMPLANT
GLOVE BIO SURGEON STRL SZ7.5 (GLOVE) ×1 IMPLANT
GLOVE BIOGEL PI IND STRL 8 (GLOVE) ×1 IMPLANT
GOWN STRL REUS W/ TWL LRG LVL3 (GOWN DISPOSABLE) ×1 IMPLANT
GOWN STRL REUS W/TWL XL LVL3 (GOWN DISPOSABLE) ×1 IMPLANT
NDL HYPO 25X1 1.5 SAFETY (NEEDLE) IMPLANT
NEEDLE HYPO 25X1 1.5 SAFETY (NEEDLE) ×1 IMPLANT
NS IRRIG 1000ML POUR BTL (IV SOLUTION) ×1 IMPLANT
PACK BASIN DAY SURGERY FS (CUSTOM PROCEDURE TRAY) ×1 IMPLANT
PADDING CAST ABS COTTON 4X4 ST (CAST SUPPLIES) ×1 IMPLANT
SPIKE FLUID TRANSFER (MISCELLANEOUS) IMPLANT
STOCKINETTE 4X48 STRL (DRAPES) ×1 IMPLANT
SUT CHROMIC 4 0 P 3 18 (SUTURE) IMPLANT
SUT CHROMIC 6 0 G 1 (SUTURE) IMPLANT
SUT ETHILON 3 0 PS 1 (SUTURE) IMPLANT
SUT ETHILON 4 0 PS 2 18 (SUTURE) IMPLANT
SUT MERSILENE 4 0 P 3 (SUTURE) IMPLANT
SUT MON AB 5-0 P3 18 (SUTURE) IMPLANT
SUT VIC AB 4-0 P-3 18XBRD (SUTURE) IMPLANT
SYR BULB EAR ULCER 3OZ GRN STR (SYRINGE) ×1 IMPLANT
SYR CONTROL 10ML LL (SYRINGE) IMPLANT
TOWEL GREEN STERILE FF (TOWEL DISPOSABLE) ×1 IMPLANT
TRAY DSU PREP LF (CUSTOM PROCEDURE TRAY) ×1 IMPLANT
UNDERPAD 30X36 HEAVY ABSORB (UNDERPADS AND DIAPERS) ×1 IMPLANT

## 2023-12-13 NOTE — Transfer of Care (Signed)
 Immediate Anesthesia Transfer of Care Note  Patient: [name]  Procedure(s) Performed: AMPUTATION, RIGHT RING FINGER (Right: Ring Finger)  Patient Location: PACU  Anesthesia Type:General  Level of Consciousness: sedated  Airway & Oxygen Therapy: Patient Spontanous Breathing and Patient connected to face mask oxygen  Post-op Assessment: Report given to RN and Post -op Vital signs reviewed and stable  Post vital signs: Reviewed and stable  Last Vitals:  Vitals Value Taken Time  BP 154/88 12/13/23 1730  Temp 36.1 C 12/13/23 1730  Pulse 52 12/13/23 1731  Resp 14 12/13/23 1732  SpO2 100 % 12/13/23 1731  Vitals shown include unfiled device data.  Last Pain:  Vitals:   12/13/23 1730  TempSrc:   PainSc: Asleep      Patients Stated Pain Goal: 5 (12/13/23 1237)  Complications: No notable events documented.

## 2023-12-13 NOTE — Progress Notes (Signed)
 Cell phone and charger placed in locker 10 with the rest of pt belongings.

## 2023-12-13 NOTE — Op Note (Signed)
 NAME: [name] MEDICAL RECORD NO: [id number] DATE OF BIRTH: Jun 23, 1976 FAC[facility] LOCATION: [facility] PHYSICIAN: Maram Bently R. Skyah Hannon, MD   OPERATIVE REPORT   DATE OF PROCEDURE: 12/13/23    PREOPERATIVE DIAGNOSIS: Right ring finger ischemia with nonhealing wound   POSTOPERATIVE DIAGNOSIS: Right ring finger ischemia with nonhealing wound   PROCEDURE: Right ring finger amputation through distal phalanx   SURGEON:  Brunilda Capra, M.D.   ASSISTANT: none   ANESTHESIA:  General   INTRAVENOUS FLUIDS:  Per anesthesia flow sheet.   ESTIMATED BLOOD LOSS:  Minimal.   COMPLICATIONS:  None.   SPECIMENS: Right ring finger to pathology for gross exam only   TOURNIQUET TIME:    Total Tourniquet Time Documented: Upper Arm (Right) - 18 minutes Total: Upper Arm (Right) - 18 minutes    DISPOSITION:  Stable to PACU.   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  She has a new nonhealing painful wound of the right ring finger.  She wishes to proceed with amputation of the distal aspect of the finger.  Risks, benefits and alternatives of surgery were discussed including the risks of blood loss, infection, damage to nerves, vessels, tendons, ligaments, bone for surgery, need for additional surgery, complications with wound healing, continued pain, stiffness.  She voiced understanding of these risks and elected to proceed.  OPERATIVE COURSE:  After being identified preoperatively by myself,  the patient and I agreed on the procedure and site of the procedure.  The surgical site was marked.  Surgical consent had been signed. Preoperative IV antibiotic prophylaxis was given. She was transferred to the operating room and placed on the operating table in supine position with the Right upper extremity on an arm board.  General anesthesia was induced by the anesthesiologist.  Right upper extremity was prepped and draped in normal sterile orthopedic fashion.   A surgical pause was performed between the surgeons, anesthesia, and operating room staff and all were in agreement as to the patient, procedure, and site of procedure.  Tourniquet at the proximal aspect of the extremity was inflated to 250 mmHg after exsanguination of the arm with an Esmarch bandage.  The nail was removed with a freer elevator.  A fishmouth style incision was made through the middle of the distal phalanx to remove any poor quality tissue while maintaining length of the finger.  The distal phalanx was amputated with the bone cutters.  The rongeurs were used to round off the end of the bone.  The soft tissues were able to be brought over the end of the bone and reapproximated.  The wound was irrigated with sterile saline by bulb syringe.  The soft tissues were reapproximated with 6-0 chromic suture in an interrupted fashion.  Good reapproximation was obtained with good contour to the finger.  A piece of Xeroform was placed in the nail fold and wound dressed with sterile Xeroform and 4 x 4 and wrapped with a Coban dressing lightly.  AlumaFoam splint was placed and wrapped lightly with Coban dressing.  A digital block of been performed with quarter percent plain Marcaine  to aid in postoperative analgesia.  The tourniquet was deflated at 18 minutes.  Fingertips were pink with brisk capillary refill after deflation of tourniquet.  The operative  drapes were broken down.  The patient was awoken from anesthesia safely.  She was transferred back to the stretcher and taken to PACU in stable condition.  I will see her back in the office  in 1 week for postoperative followup.  I will give her a prescription for Percocet 5/325 1 tab PO q6 hours prn pain, dispense #20.   [name], MD Electronically signed, 12/13/23

## 2023-12-13 NOTE — Anesthesia Procedure Notes (Signed)
 Procedure Name: LMA Insertion Date/Time: 12/13/2023 4:53 PM  Performed by: Noralyn Beams, CRNAPre-anesthesia Checklist: Patient identified, Emergency Drugs available, Suction available and Patient being monitored Patient Re-evaluated:Patient Re-evaluated prior to induction Oxygen Delivery Method: Circle system utilized Preoxygenation: Pre-oxygenation with 100% oxygen Induction Type: IV induction Ventilation: Mask ventilation without difficulty LMA: LMA inserted LMA Size: 4.0 Number of attempts: 1 Airway Equipment and Method: Bite block Placement Confirmation: positive ETCO2 Tube secured with: Tape Dental Injury: Teeth and Oropharynx as per pre-operative assessment

## 2023-12-13 NOTE — Anesthesia Preprocedure Evaluation (Addendum)
 Anesthesia Evaluation  Patient identified by MRN, date of birth, ID band Patient awake    Reviewed: Allergy & Precautions, NPO status , Patient's Chart, lab work & pertinent test results  Airway Mallampati: I  TM Distance: >3 FB Neck ROM: Full    Dental no notable dental hx. (+) Teeth Intact, Dental Advisory Given   Pulmonary former smoker   Pulmonary exam normal breath sounds clear to auscultation       Cardiovascular hypertension, Pt. on medications Normal cardiovascular exam Rhythm:Regular Rate:Normal     Neuro/Psych  PSYCHIATRIC DISORDERS Anxiety     negative neurological ROS     GI/Hepatic negative GI ROS,,,(+)     substance abuse  alcohol use  Endo/Other  Hypothyroidism    Renal/GU negative Renal ROS  negative genitourinary   Musculoskeletal negative musculoskeletal ROS (+)    Abdominal   Peds  Hematology negative hematology ROS (+)   Anesthesia Other Findings Raynaud disease  Reproductive/Obstetrics                             Anesthesia Physical Anesthesia Plan  ASA: 2  Anesthesia Plan: General   Post-op Pain Management: Tylenol  PO (pre-op)*   Induction: Intravenous  PONV Risk Score and Plan: 3 and Ondansetron , Dexamethasone  and Midazolam   Airway Management Planned: LMA  Additional Equipment:   Intra-op Plan:   Post-operative Plan: Extubation in OR  Informed Consent: I have reviewed the patients History and Physical, chart, labs and discussed the procedure including the risks, benefits and alternatives for the proposed anesthesia with the patient or authorized representative who has indicated his/her understanding and acceptance.     Dental advisory given  Plan Discussed with: CRNA  Anesthesia Plan Comments:        Anesthesia Quick Evaluation

## 2023-12-13 NOTE — H&P (Signed)
 [name] is an 48 y.o. female.   Chief Complaint: non healing wound HPI: 48 yo female with vasculopathy of fingers leading to multiple non healing wounds.  She has had amputation of distal aspect of multiple fingers to relieve pain and achieve healing.  Currently with painful non healing wound of right ring finger.  She wishes to proceed with distal amputation of finger.  Allergies: No Known Allergies  Past Medical History:  Diagnosis Date   Anxiety    History of cervical dysplasia    20 yrs ago    Hypertension    Hypothyroidism    PTSD (post-traumatic stress disorder)    Raynaud disease    Sarcoma (HCC)     Past Surgical History:  Procedure Laterality Date   AMPUTATION Left 05/24/2023   Procedure: revision amputation left small finger;  Surgeon: Brunilda Capra, MD;  Location: [facility];  Service: Orthopedics;  Laterality: Left;  time need 45 minutes   AMPUTATION Bilateral 07/16/2023   Procedure: revision amputation with incision and drainage left ring finger and right index finger;  Surgeon: Brunilda Capra, MD;  Location: [facility];  Service: Orthopedics;  Laterality: Bilateral;   AMPUTATION Left 09/07/2023   Procedure: AMPUTATION DIGIT LEFT THUMB;  Surgeon: Brunilda Capra, MD;  Location: [facility];  Service: Orthopedics;  Laterality: Left;   AMPUTATION FINGER Right 09/27/2023   Procedure: AMPUTATION RIGHT INDEX FINGER;  Surgeon: Brunilda Capra, MD;  Location: [facility];  Service: Orthopedics;  Laterality: Right;  PARTIAL AMPUTATION LEFT INDEX FINGER   APPENDECTOMY     CESAREAN SECTION     INCISION AND DRAINAGE Bilateral 07/16/2023   Procedure: INCISION AND DRAINAGE OF LEFT RING AND RIGHT INDEX FINGERS;  Surgeon: Brunilda Capra, MD;  Location: [facility];  Service: Orthopedics;  Laterality: Bilateral;   LYMPH NODE BIOPSY      Family History: Family History  Problem Relation Age of Onset   Hypertension  Mother    Breast cancer Mother    Diabetes Father    Hypertension Father    Breast cancer Paternal Grandmother     Social History:   reports that she quit smoking about 6 months ago. Her smoking use included cigarettes. She has never used smokeless tobacco. She reports that she does not currently use alcohol. She reports that she does not use drugs.  Medications: Medications Prior to Admission  Medication Sig Dispense Refill   amLODipine (NORVASC) 2.5 MG tablet Take 2.5 mg by mouth daily.     aspirin  EC 81 MG tablet Take 1 tablet (81 mg total) by mouth daily. Swallow whole. 30 tablet 12   atorvastatin  (LIPITOR) 20 MG tablet Take 1 tablet (20 mg total) by mouth daily. 30 tablet 2   doxycycline  (VIBRAMYCIN ) 50 MG capsule Take 2 capsules (100 mg total) by mouth 2 (two) times daily. (Patient not taking: Reported on 10/08/2023) 28 capsule 0   hydrOXYzine  (VISTARIL ) 25 MG capsule Take 25 mg by mouth 3 (three) times daily as needed. (Patient not taking: Reported on 10/08/2023)     meloxicam (MOBIC) 15 MG tablet Take 15 mg by mouth daily. (Patient not taking: Reported on 10/08/2023)     ondansetron  (ZOFRAN -ODT) 4 MG disintegrating tablet Take 1 tablet (4 mg total) by mouth every 8 (eight) hours as needed for nausea or vomiting. (Patient not taking: Reported on 10/08/2023) 20 tablet 0   oxyCODONE -acetaminophen  (PERCOCET) 5-325 MG tablet Take 1 tablet by mouth every 6 (  six) hours as needed. (Patient not taking: Reported on 10/08/2023) 20 tablet 0   promethazine  (PHENERGAN ) 25 MG suppository Place 1 suppository (25 mg total) rectally every 6 (six) hours as needed for nausea or vomiting. (Patient not taking: Reported on 10/08/2023) 12 each 0   traZODone  (DESYREL ) 50 MG tablet Take 1 tablet (50 mg total) by mouth at bedtime as needed for sleep. (Patient not taking: Reported on 10/08/2023) 30 tablet 0    No results found for this or any previous visit (from the past 48 hours).  No results found.    Blood  pressure (!) 160/94, pulse 63, temperature 98.1 F (36.7 C), temperature source Temporal, resp. rate 18, height 5\' 3"  (1.6 m), weight 51.9 kg, last menstrual period 05/10/2022, SpO2 99%.  General appearance: alert, cooperative, and appears stated age Head: Normocephalic, without obvious abnormality, atraumatic Neck: supple, symmetrical, trachea midline Extremities: Intact sensation and capillary refill all digits.  +epl/fpl/io.  No wounds.  Skin: Skin color, texture, turgor normal. No rashes or lesions Neurologic: Grossly normal Incision/Wound: none  Assessment/Plan Right ring finger painful non healing wound.  She wishes to proceed with distal finger amputation.  Risks, benefits and alternatives of surgery were discussed including risks of blood loss, infection, damage to nerves/vessels/tendons/ligament/bone, failure of surgery, need for additional surgery, complication with wound healing, stiffness, recurrence.  She voiced understanding of these risks and elected to proceed.    Mikea Quadros 12/13/2023, 4:23 PM

## 2023-12-13 NOTE — Discharge Instructions (Addendum)
 [facility] Instructions Hand Surgery  Wound Care: Keep your hand elevated above the level of your heart.  Do not allow it to dangle by your side.  Keep the dressing dry and do not remove it unless your doctor advises you to do so.  He will usually change it at the time of your post-op visit.  Moving your fingers is advised to stimulate circulation but will depend on the site of your surgery.  If you have a splint applied, your doctor will advise you regarding movement.  Activity: Do not drive or operate machinery today.  Rest today and then you may return to your normal activity and work as indicated by your physician.  Diet:  Drink liquids today or eat a light diet.  You may resume a regular diet tomorrow.    General expectations: Pain for two to three days. Fingers may become slightly swollen.  Call your doctor if any of the following occur: Severe pain not relieved by pain medication. Elevated temperature. Dressing soaked with blood. Inability to move fingers. White or bluish color to fingers.    No tylenol  until 8pm   Post Anesthesia Home Care Instructions  Activity: Get plenty of rest for the remainder of the day. A responsible individual must stay with you for 24 hours following the procedure.  For the next 24 hours, DO NOT: -Drive a car -Advertising copywriter -Drink alcoholic beverages -Take any medication unless instructed by your physician -Make any legal decisions or sign important papers.  Meals: Start with liquid foods such as gelatin or soup. Progress to regular foods as tolerated. Avoid greasy, spicy, heavy foods. If nausea and/or vomiting occur, drink only clear liquids until the nausea and/or vomiting subsides. Call your physician if vomiting continues.  Special Instructions/Symptoms: Your throat may feel dry or sore from the anesthesia or the breathing tube placed in your throat during surgery. If this causes discomfort, gargle with warm salt water. The  discomfort should disappear within 24 hours.  If you had a scopolamine patch placed behind your ear for the management of post- operative nausea and/or vomiting:  1. The medication in the patch is effective for 72 hours, after which it should be removed.  Wrap patch in a tissue and discard in the trash. Wash hands thoroughly with soap and water. 2. You may remove the patch earlier than 72 hours if you experience unpleasant side effects which may include dry mouth, dizziness or visual disturbances. 3. Avoid touching the patch. Wash your hands with soap and water after contact with the patch.

## 2023-12-14 ENCOUNTER — Encounter (HOSPITAL_BASED_OUTPATIENT_CLINIC_OR_DEPARTMENT_OTHER): Payer: Self-pay | Admitting: Orthopedic Surgery

## 2023-12-14 NOTE — Anesthesia Postprocedure Evaluation (Signed)
 Anesthesia Post Note  Patient: Meklit A Hogue  Procedure(s) Performed: AMPUTATION, RIGHT RING FINGER (Right: Ring Finger)     Patient location during evaluation: PACU Anesthesia Type: General Level of consciousness: awake and alert Pain management: pain level controlled Vital Signs Assessment: post-procedure vital signs reviewed and stable Respiratory status: spontaneous breathing, nonlabored ventilation, respiratory function stable and patient connected to nasal cannula oxygen Cardiovascular status: blood pressure returned to baseline and stable Postop Assessment: no apparent nausea or vomiting Anesthetic complications: no  No notable events documented.  Last Vitals:  Vitals:   12/13/23 1730 12/13/23 1743  BP: (!) 154/88 (!) 150/92  Pulse: (!) 50 (!) 56  Resp: 13 14  Temp: (!) 36.1 C (!) 36.2 C  SpO2: 100% 100%    Last Pain:  Vitals:   12/14/23 1001  TempSrc:   PainSc: 0-No pain   Pain Goal: Patients Stated Pain Goal: 5 (12/13/23 1237)                 Hyla Coard L Maleko Greulich

## 2023-12-17 LAB — SURGICAL PATHOLOGY

## 2023-12-25 ENCOUNTER — Ambulatory Visit (INDEPENDENT_AMBULATORY_CARE_PROVIDER_SITE_OTHER): Payer: MEDICAID

## 2023-12-25 ENCOUNTER — Encounter: Payer: Self-pay | Admitting: Emergency Medicine

## 2023-12-25 ENCOUNTER — Ambulatory Visit
Admission: EM | Admit: 2023-12-25 | Discharge: 2023-12-25 | Disposition: A | Discharge status: Home / Self Care | Payer: MEDICAID | Attending: Physician Assistant | Admitting: Physician Assistant

## 2023-12-25 ENCOUNTER — Ambulatory Visit: Payer: MEDICAID

## 2023-12-25 DIAGNOSIS — M25571 Pain in right ankle and joints of right foot: Secondary | ICD-10-CM | POA: Diagnosis not present

## 2023-12-25 DIAGNOSIS — S93401A Sprain of unspecified ligament of right ankle, initial encounter: Secondary | ICD-10-CM | POA: Diagnosis not present

## 2023-12-25 MED ORDER — ACETAMINOPHEN 325 MG PO TABS: 650.0000 mg | ORAL_TABLET | Freq: Once | ORAL | Status: DC

## 2023-12-25 NOTE — ED Triage Notes (Signed)
 Pt presents c/o left foot injury and pain. Pt says the foot was shut in a door 20 days ago. Pt has tried compression and OTC products but sxs have not improved.

## 2023-12-25 NOTE — ED Provider Notes (Signed)
 [name]    CSN: [id number] Arrival date & time: 12/25/23  1400      History   Chief Complaint Chief Complaint  Patient presents with   Foot Injury    HPI [name] is a 48 y.o. female.   Patient reports her foot was shut in a door 3 weeks ago.  Patient reports she continues to have swelling and pain.  Patient states that she has tried ice and elevation without relief.  Patient denies any other area of injury.  She has not had any evaluation for the injury  The history is provided by the patient. No language interpreter was used.  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[facility])     Patient Active Problem List   Diagnosis Date Noted   Alcohol dependence (HCC) 03/29/2023   Alcohol withdrawal (HCC) 04/13/2021   Alcohol use disorder, severe, dependence (HCC) 04/12/2021    Past Surgical History:  Procedure Laterality Date   AMPUTATION Left 05/24/2023   Procedure: revision amputation left small finger;  Surgeon: Brunilda Capra, MD;  Location: [facility];  Service: Orthopedics;  Laterality: Left;  time need 45 minutes   AMPUTATION Bilateral 07/16/2023   Procedure: revision amputation with incision and drainage left ring finger and right index finger;  Surgeon: Brunilda Capra, MD;  Location: [facility];  Service: Orthopedics;  Laterality: Bilateral;   AMPUTATION Left 09/07/2023   Procedure: AMPUTATION DIGIT LEFT THUMB;  Surgeon: Brunilda Capra, MD;  Location: [facility];  Service: Orthopedics;  Laterality: Left;   AMPUTATION FINGER Right 09/27/2023   Procedure: AMPUTATION RIGHT INDEX FINGER;  Surgeon: Brunilda Capra, MD;  Location: [facility];  Service: Orthopedics;  Laterality: Right;  PARTIAL AMPUTATION LEFT INDEX FINGER    AMPUTATION FINGER Right 12/13/2023   Procedure: AMPUTATION, RIGHT RING FINGER;  Surgeon: Brunilda Capra, MD;  Location: [facility];  Service: Orthopedics;  Laterality: Right;   APPENDECTOMY     CESAREAN SECTION     INCISION AND DRAINAGE Bilateral 07/16/2023   Procedure: INCISION AND DRAINAGE OF LEFT RING AND RIGHT INDEX FINGERS;  Surgeon: Brunilda Capra, MD;  Location: [facility];  Service: Orthopedics;  Laterality: Bilateral;   LYMPH NODE BIOPSY      OB History     Gravida  4   Para  3   Term  3   Preterm      AB  1   Living  3      SAB      IAB      Ectopic      Multiple      Live Births  4            Home Medications    Prior to Admission medications   Medication Sig Start Date End Date Taking? Authorizing Provider  amLODipine (NORVASC) 2.5 MG tablet Take 2.5 mg by mouth daily. 04/20/23   [provider]  aspirin  EC 81 MG tablet Take 1 tablet (81 mg total) by mouth daily. Swallow whole. 04/27/23   Philipp Brawn, MD  atorvastatin  (LIPITOR) 20 MG tablet Take 1 tablet (20 mg total) by mouth daily. 04/27/23   Philipp Brawn, MD  doxycycline  (VIBRAMYCIN ) 50 MG capsule Take 2 capsules (100 mg total) by mouth 2 (two) times daily. Patient not taking: Reported on 10/08/2023 09/27/23   Kuzma, Kevin, MD  hydrOXYzine  (VISTARIL ) 25 MG capsule Take 25 mg by mouth 3 (three) times daily as needed. Patient not taking: Reported on 10/08/2023    [provider]  meloxicam (MOBIC) 15 MG tablet Take 15 mg by mouth daily. Patient not taking: Reported on 10/08/2023    [provider]  ondansetron  (ZOFRAN -ODT) 4 MG disintegrating tablet Take 1 tablet (4 mg total) by mouth every 8 (eight) hours as needed for nausea or vomiting. Patient not taking: Reported on 10/08/2023 07/17/23   Dorenda Gandy, MD  oxyCODONE -acetaminophen  (PERCOCET) 5-325 MG tablet Take 1 tablet by mouth every 6 (six) hours as needed. 12/13/23   Kuzma, Kevin, MD   promethazine  (PHENERGAN ) 25 MG suppository Place 1 suppository (25 mg total) rectally every 6 (six) hours as needed for nausea or vomiting. Patient not taking: Reported on 10/08/2023 07/17/23   Dorenda Gandy, MD  traZODone  (DESYREL ) 50 MG tablet Take 1 tablet (50 mg total) by mouth at bedtime as needed for sleep. Patient not taking: Reported on 10/08/2023 04/01/23   Shery Done, MD    Family History Family History  Problem Relation Age of Onset   Hypertension Mother    Breast cancer Mother    Diabetes Father    Hypertension Father    Breast cancer Paternal Grandmother     Social History Social History   Tobacco Use   Smoking status: Former    Current packs/day: 0.00    Types: Cigarettes    Quit date: 06/11/2023    Years since quitting: 0.5   Smokeless tobacco: Never  Vaping Use   Vaping status: Never Used  Substance Use Topics   Alcohol use: Not Currently   Drug use: No     Allergies   Patient has no known allergies.   Review of Systems Review of Systems  All other systems reviewed and are negative.    Physical Exam Triage Vital Signs ED Triage Vitals  Encounter Vitals Group     BP 12/25/23 1510 (!) 174/115     Girls Systolic BP Percentile --      Girls Diastolic BP Percentile --      Boys Systolic BP Percentile --      Boys Diastolic BP Percentile --      Pulse Rate 12/25/23 1510 92     Resp 12/25/23 1510 18     Temp 12/25/23 1510 98.2 F (36.8 C)     Temp Source 12/25/23 1510 Oral     SpO2 12/25/23 1510 98 %     Weight 12/25/23 1509 114 lb 6.7 oz (51.9 kg)     Height --      Head Circumference --      Peak Flow --      Pain Score 12/25/23 1507 10     Pain Loc --      Pain Education --      Exclude from Growth Chart --    No data found.  Updated Vital Signs BP (!) 174/115 (BP Location: Left Arm)   Pulse 92   Temp 98.2 F (36.8 C) (Oral)   Resp 18   Wt 51.9 kg   LMP 05/10/2022   SpO2 98%   BMI 20.27 kg/m   Visual Acuity Right Eye  Distance:   Left Eye Distance:   Bilateral Distance:    Right  Eye Near:   Left Eye Near:    Bilateral Near:     Physical Exam Vitals and nursing note reviewed.  Constitutional:      Appearance: She is well-developed.  HENT:     Head: Normocephalic.   Musculoskeletal:        General: Swelling and tenderness present. Normal range of motion.     Comments: Swollen tender left foot decreased range of motion, tender left ankle pain with movement neurovascular neurosensory intact   Skin:    General: Skin is warm.   Neurological:     General: No focal deficit present.     Mental Status: She is alert and oriented to person, place, and time.      UC Treatments / Results  Labs (all labs ordered are listed, but only abnormal results are displayed) Labs Reviewed - No data to display  EKG   Radiology DG Foot Complete Left Result Date: 12/25/2023 CLINICAL DATA:  Foot injury, pain EXAM: LEFT FOOT - COMPLETE 3+ VIEW COMPARISON:  None Available. FINDINGS: There is no evidence of fracture or dislocation. There is no evidence of arthropathy or other focal bone abnormality. Soft tissues are unremarkable. IMPRESSION: Negative. Electronically Signed   By: Esmeralda Hedge M.D.   On: 12/25/2023 16:31   DG Ankle Complete Left Result Date: 12/25/2023 CLINICAL DATA:  Injury pain EXAM: LEFT ANKLE COMPLETE - 3+ VIEW COMPARISON:  None Available. FINDINGS: No acute fracture or malalignment. Ankle mortise is symmetric. Soft tissues are unremarkable IMPRESSION: No acute osseous abnormality. Electronically Signed   By: Esmeralda Hedge M.D.   On: 12/25/2023 16:29    Procedures Procedures (including critical care time)  Medications Ordered in UC Medications - No data to display  Initial Impression / Assessment and Plan / UC Course  I have reviewed the triage vital signs and the nursing notes.  Pertinent labs & imaging results that were available during my care of the patient were reviewed by me and  considered in my medical decision making (see chart for details).     X-ray shows no evidence of fracture.  Patient is placed in an ASO she reports this makes her foot and ankle feel much better.  Patient is advised to follow-up with her primary care physician return if any problems Final Clinical Impressions(s) / UC Diagnoses   Final diagnoses:  Pain in joint involving right ankle and foot  Sprain of right ankle, unspecified ligament, initial encounter     Discharge Instructions      Return if any problems.    ED Prescriptions   None    PDMP not reviewed this encounter. An After Visit Summary was printed and given to the patient.       Sandi Crosby, PA-C 12/25/23 1928

## 2023-12-25 NOTE — Discharge Instructions (Addendum)
 Return if any problems.

## 2024-01-07 ENCOUNTER — Emergency Department (HOSPITAL_COMMUNITY)
Admission: EM | Admit: 2024-01-07 | Discharge: 2024-01-08 | Disposition: A | Discharge status: Home / Self Care | Payer: MEDICAID | Attending: Emergency Medicine | Admitting: Emergency Medicine

## 2024-01-07 ENCOUNTER — Other Ambulatory Visit: Payer: Self-pay

## 2024-01-07 DIAGNOSIS — F1410 Cocaine abuse, uncomplicated: Secondary | ICD-10-CM | POA: Insufficient documentation

## 2024-01-07 DIAGNOSIS — F1290 Cannabis use, unspecified, uncomplicated: Secondary | ICD-10-CM | POA: Diagnosis not present

## 2024-01-07 DIAGNOSIS — F1210 Cannabis abuse, uncomplicated: Secondary | ICD-10-CM | POA: Insufficient documentation

## 2024-01-07 DIAGNOSIS — Z79899 Other long term (current) drug therapy: Secondary | ICD-10-CM | POA: Diagnosis not present

## 2024-01-07 DIAGNOSIS — F32A Depression, unspecified: Secondary | ICD-10-CM | POA: Diagnosis not present

## 2024-01-07 DIAGNOSIS — F1490 Cocaine use, unspecified, uncomplicated: Secondary | ICD-10-CM | POA: Diagnosis not present

## 2024-01-07 DIAGNOSIS — R000 Tachycardia, unspecified: Secondary | ICD-10-CM | POA: Diagnosis not present

## 2024-01-07 DIAGNOSIS — R4182 Altered mental status, unspecified: Secondary | ICD-10-CM | POA: Diagnosis present

## 2024-01-07 DIAGNOSIS — F29 Unspecified psychosis not due to a substance or known physiological condition: Secondary | ICD-10-CM | POA: Insufficient documentation

## 2024-01-07 DIAGNOSIS — F419 Anxiety disorder, unspecified: Secondary | ICD-10-CM

## 2024-01-07 DIAGNOSIS — F1010 Alcohol abuse, uncomplicated: Secondary | ICD-10-CM | POA: Insufficient documentation

## 2024-01-07 DIAGNOSIS — F1090 Alcohol use, unspecified, uncomplicated: Secondary | ICD-10-CM

## 2024-01-07 DIAGNOSIS — Y906 Blood alcohol level of 120-199 mg/100 ml: Secondary | ICD-10-CM | POA: Diagnosis not present

## 2024-01-07 DIAGNOSIS — F1910 Other psychoactive substance abuse, uncomplicated: Secondary | ICD-10-CM

## 2024-01-07 LAB — CBG MONITORING, ED: Glucose-Capillary: 151 mg/dL — ABNORMAL HIGH (ref 70–99)

## 2024-01-07 LAB — COMPREHENSIVE METABOLIC PANEL WITH GFR
ALT: 11 U/L (ref 0–44)
AST: 22 U/L (ref 15–41)
Albumin: 4.5 g/dL (ref 3.5–5.0)
Alkaline Phosphatase: 78 U/L (ref 38–126)
Anion gap: 20 — ABNORMAL HIGH (ref 5–15)
BUN: 19 mg/dL (ref 6–20)
CO2: 19 mmol/L — ABNORMAL LOW (ref 22–32)
Calcium: 10.1 mg/dL (ref 8.9–10.3)
Chloride: 105 mmol/L (ref 98–111)
Creatinine, Ser: 1.26 mg/dL — ABNORMAL HIGH (ref 0.44–1.00)
GFR, Estimated: 53 mL/min — ABNORMAL LOW (ref >60)
Glucose, Bld: 135 mg/dL — ABNORMAL HIGH (ref 70–99)
Potassium: 4.1 mmol/L (ref 3.5–5.1)
Sodium: 144 mmol/L (ref 135–145)
Total Bilirubin: 1.2 mg/dL (ref 0.0–1.2)
Total Protein: 8.1 g/dL (ref 6.5–8.1)

## 2024-01-07 LAB — CBC WITH DIFFERENTIAL/PLATELET
Abs Immature Granulocytes: 0.01 10*3/uL (ref 0.00–0.07)
Basophils Absolute: 0.1 10*3/uL (ref 0.0–0.1)
Basophils Relative: 1 %
Eosinophils Absolute: 0.1 10*3/uL (ref 0.0–0.5)
Eosinophils Relative: 2 %
HCT: 39.4 % (ref 36.0–46.0)
Hemoglobin: 13.3 g/dL (ref 12.0–15.0)
Immature Granulocytes: 0 %
Lymphocytes Relative: 25 %
Lymphs Abs: 1.6 10*3/uL (ref 0.7–4.0)
MCH: 30.7 pg (ref 26.0–34.0)
MCHC: 33.8 g/dL (ref 30.0–36.0)
MCV: 91 fL (ref 80.0–100.0)
Monocytes Absolute: 0.6 10*3/uL (ref 0.1–1.0)
Monocytes Relative: 9 %
Neutro Abs: 4 10*3/uL (ref 1.7–7.7)
Neutrophils Relative %: 63 %
Platelets: 285 10*3/uL (ref 150–400)
RBC: 4.33 MIL/uL (ref 3.87–5.11)
RDW: 12.5 % (ref 11.5–15.5)
WBC: 6.4 10*3/uL (ref 4.0–10.5)
nRBC: 0 % (ref 0.0–0.2)

## 2024-01-07 LAB — RAPID URINE DRUG SCREEN, HOSP PERFORMED
Amphetamines: NOT DETECTED
Barbiturates: NOT DETECTED
Benzodiazepines: NOT DETECTED
Cocaine: POSITIVE — AB
Opiates: NOT DETECTED
Tetrahydrocannabinol: POSITIVE — AB

## 2024-01-07 LAB — TSH: TSH: 0.498 u[IU]/mL (ref 0.350–4.500)

## 2024-01-07 LAB — ETHANOL: Alcohol, Ethyl (B): 198 mg/dL — ABNORMAL HIGH (ref <15)

## 2024-01-07 MED ORDER — LORAZEPAM 1 MG PO TABS
1.0000 mg | ORAL_TABLET | Freq: Once | ORAL | Status: AC
  Administered 2024-01-07: 1 mg via ORAL
  Filled 2024-01-07: qty 1

## 2024-01-07 MED ORDER — HYDROCODONE-ACETAMINOPHEN 5-325 MG PO TABS
1.0000 | ORAL_TABLET | Freq: Once | ORAL | Status: AC
  Administered 2024-01-07: 1 via ORAL
  Filled 2024-01-07: qty 1

## 2024-01-07 MED ORDER — METOPROLOL TARTRATE 5 MG/5ML IV SOLN
5.0000 mg | Freq: Once | INTRAVENOUS | Status: AC
  Administered 2024-01-07: 5 mg via INTRAVENOUS
  Filled 2024-01-07: qty 5

## 2024-01-07 MED ORDER — SODIUM CHLORIDE 0.9 % IV BOLUS
500.0000 mL | Freq: Once | INTRAVENOUS | Status: AC
  Administered 2024-01-07: 500 mL via INTRAVENOUS

## 2024-01-07 MED ORDER — SODIUM CHLORIDE 0.9 % IV BOLUS
1000.0000 mL | Freq: Once | INTRAVENOUS | Status: AC
  Administered 2024-01-07: 1000 mL via INTRAVENOUS

## 2024-01-07 MED ORDER — LORAZEPAM 2 MG/ML IJ SOLN
2.0000 mg | Freq: Once | INTRAMUSCULAR | Status: AC
  Administered 2024-01-07: 1 mg via INTRAVENOUS
  Filled 2024-01-07: qty 1

## 2024-01-07 MED ORDER — AMLODIPINE BESYLATE 5 MG PO TABS
5.0000 mg | ORAL_TABLET | Freq: Once | ORAL | Status: AC
  Administered 2024-01-07: 5 mg via ORAL
  Filled 2024-01-07: qty 1
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
## 2024-01-07 NOTE — BH Assessment (Addendum)
 This patient has been referred to [facility] telecare fo her teleassessment. Iris telecare coordinator will reach out with a time and provider to see patient.

## 2024-01-07 NOTE — ED Notes (Signed)
 Patient is resting comfortably.

## 2024-01-07 NOTE — ED Provider Notes (Signed)
 [place] EMERGENCY DEPARTMENT AT [facility] Provider Note   CSN: [id number] Arrival date & time: 01/07/24  1711     Patient presents with: Altered Mental Status   [name] is a 48 y.o. female.  {Add pertinent medical, surgical, social history, OB history to YEP:67052} Patient states that she has been drinking alcohol and taking drugs.  She presents very anxious  The history is provided by the patient and medical records. No language interpreter was used.  Palpitations Palpitations quality:  Regular Onset quality:  Sudden Timing:  Constant Progression:  Waxing and waning Chronicity:  New Context: anxiety   Relieved by:  Nothing Worsened by:  Nothing Ineffective treatments:  None tried Associated symptoms: no back pain, no chest pain and no cough   Risk factors: no diabetes mellitus        Prior to Admission medications   Medication Sig Start Date End Date Taking? Authorizing Provider  amLODipine (NORVASC) 2.5 MG tablet Take 2.5 mg by mouth daily. 04/20/23   [provider]  aspirin  EC 81 MG tablet Take 1 tablet (81 mg total) by mouth daily. Swallow whole. 04/27/23   Pearline Norman RAMAN, MD  atorvastatin  (LIPITOR) 20 MG tablet Take 1 tablet (20 mg total) by mouth daily. 04/27/23   Pearline Norman RAMAN, MD  doxycycline  (VIBRAMYCIN ) 50 MG capsule Take 2 capsules (100 mg total) by mouth 2 (two) times daily. Patient not taking: Reported on 10/08/2023 09/27/23   Kuzma, Kevin, MD  hydrOXYzine  (VISTARIL ) 25 MG capsule Take 25 mg by mouth 3 (three) times daily as needed. Patient not taking: Reported on 10/08/2023    [provider]  meloxicam (MOBIC) 15 MG tablet Take 15 mg by mouth daily. Patient not taking: Reported on 10/08/2023    [provider]  ondansetron  (ZOFRAN -ODT) 4 MG disintegrating tablet Take 1 tablet (4 mg total) by mouth every 8 (eight) hours as needed for nausea or vomiting. Patient not taking: Reported on 10/08/2023 07/17/23    Garrick Charleston, MD  oxyCODONE -acetaminophen  (PERCOCET) 5-325 MG tablet Take 1 tablet by mouth every 6 (six) hours as needed. 12/13/23   Kuzma, Kevin, MD  promethazine  (PHENERGAN ) 25 MG suppository Place 1 suppository (25 mg total) rectally every 6 (six) hours as needed for nausea or vomiting. Patient not taking: Reported on 10/08/2023 07/17/23   Garrick Charleston, MD  traZODone  (DESYREL ) 50 MG tablet Take 1 tablet (50 mg total) by mouth at bedtime as needed for sleep. Patient not taking: Reported on 10/08/2023 04/01/23   Rainelle Pfeiffer, MD    Allergies: Patient has no known allergies.    Review of Systems  Constitutional:  Negative for appetite change and fatigue.  HENT:  Negative for congestion, ear discharge and sinus pressure.   Eyes:  Negative for discharge.  Respiratory:  Negative for cough.   Cardiovascular:  Positive for palpitations. Negative for chest pain.  Gastrointestinal:  Negative for abdominal pain and diarrhea.  Genitourinary:  Negative for frequency and hematuria.  Musculoskeletal:  Negative for back pain.  Skin:  Negative for rash.  Neurological:  Negative for seizures and headaches.  Psychiatric/Behavioral:  Positive for agitation and dysphoric mood.     Updated Vital Signs BP (!) 139/98   Pulse (!) 108   Temp 98 F (36.7 C) (Oral)   Resp 12   LMP 05/10/2022   SpO2 93%   Physical Exam Vitals and nursing note reviewed.  Constitutional:      Appearance: She is well-developed.  HENT:  Head: Normocephalic.     Nose: Nose normal.     Mouth/Throat:     Mouth: Mucous membranes are moist.   Eyes:     General: No scleral icterus.    Conjunctiva/sclera: Conjunctivae normal.   Neck:     Thyroid : No thyromegaly.   Cardiovascular:     Rate and Rhythm: Regular rhythm. Tachycardia present.     Heart sounds: No murmur heard.    No friction rub. No gallop.  Pulmonary:     Breath sounds: No stridor. No wheezing or rales.  Chest:     Chest wall: No tenderness.   Abdominal:     General: There is no distension.     Tenderness: There is no abdominal tenderness. There is no rebound.   Musculoskeletal:        General: Normal range of motion.     Cervical back: Neck supple.  Lymphadenopathy:     Cervical: No cervical adenopathy.   Skin:    Findings: No erythema or rash.   Neurological:     Mental Status: She is alert and oriented to person, place, and time.     Motor: No abnormal muscle tone.     Coordination: Coordination normal.   Psychiatric:     Comments: Depressed but not suicidal or homicidal and not having hallucinations     (all labs ordered are listed, but only abnormal results are displayed) Labs Reviewed  COMPREHENSIVE METABOLIC PANEL WITH GFR - Abnormal; Notable for the following components:      Result Value   CO2 19 (*)    Glucose, Bld 135 (*)    Creatinine, Ser 1.26 (*)    GFR, Estimated 53 (*)    Anion gap 20 (*)    All other components within normal limits  RAPID URINE DRUG SCREEN, HOSP PERFORMED - Abnormal; Notable for the following components:   Cocaine POSITIVE (*)    Tetrahydrocannabinol POSITIVE (*)    All other components within normal limits  ETHANOL - Abnormal; Notable for the following components:   Alcohol, Ethyl (B) 198 (*)    All other components within normal limits  CBG MONITORING, ED - Abnormal; Notable for the following components:   Glucose-Capillary 151 (*)    All other components within normal limits  CBC WITH DIFFERENTIAL/PLATELET  TSH    EKG: None  Radiology: No results found.  {Document cardiac monitor, telemetry assessment procedure when appropriate:32947} Procedures   Medications Ordered in the ED  LORazepam  (ATIVAN ) injection 2 mg (1 mg Intravenous Given 01/07/24 1852)  sodium chloride  0.9 % bolus 500 mL (0 mLs Intravenous Stopped 01/07/24 1930)  metoprolol tartrate (LOPRESSOR) injection 5 mg (5 mg Intravenous Given 01/07/24 1853)      {Click here for ABCD2, HEART and other  calculators REFRESH Note before signing:1}                              Medical Decision Making Amount and/or Complexity of Data Reviewed Labs: ordered.  Risk Prescription drug management.   Patient with cocaine abuse and alcohol abuse and tachycardia from the cocaine.  She is medically cleared now and will be evaluated by behavioral health  {Document critical care time when appropriate  Document review of labs and clinical decision tools ie CHADS2VASC2, etc  Document your independent review of radiology images and any outside records  Document your discussion with family members, caretakers and with consultants  Document social determinants of  health affecting pt's care  Document your decision making why or why not admission, treatments were needed:32947:::1}   Final diagnoses:  None    ED Discharge Orders     None

## 2024-01-08 ENCOUNTER — Encounter (HOSPITAL_COMMUNITY): Payer: Self-pay

## 2024-01-08 DIAGNOSIS — F1490 Cocaine use, unspecified, uncomplicated: Secondary | ICD-10-CM

## 2024-01-08 DIAGNOSIS — F1090 Alcohol use, unspecified, uncomplicated: Secondary | ICD-10-CM

## 2024-01-08 DIAGNOSIS — F1290 Cannabis use, unspecified, uncomplicated: Secondary | ICD-10-CM

## 2024-01-08 DIAGNOSIS — F419 Anxiety disorder, unspecified: Secondary | ICD-10-CM

## 2024-01-08 MED ORDER — HYDROXYZINE HCL 25 MG PO TABS
50.0000 mg | ORAL_TABLET | Freq: Three times a day (TID) | ORAL | Status: DC | PRN
  Administered 2024-01-08: 50 mg via ORAL
  Filled 2024-01-08: qty 2
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
## 2024-01-24 ENCOUNTER — Encounter (HOSPITAL_BASED_OUTPATIENT_CLINIC_OR_DEPARTMENT_OTHER): Payer: Self-pay | Admitting: Orthopedic Surgery

## 2024-02-22 ENCOUNTER — Ambulatory Visit (HOSPITAL_BASED_OUTPATIENT_CLINIC_OR_DEPARTMENT_OTHER): Payer: MEDICAID | Admitting: Anesthesiology

## 2024-02-22 ENCOUNTER — Encounter (HOSPITAL_BASED_OUTPATIENT_CLINIC_OR_DEPARTMENT_OTHER): Payer: Self-pay | Admitting: Orthopedic Surgery

## 2024-02-22 ENCOUNTER — Ambulatory Visit (HOSPITAL_BASED_OUTPATIENT_CLINIC_OR_DEPARTMENT_OTHER)
Admission: RE | Admit: 2024-02-22 | Discharge: 2024-02-22 | Disposition: A | Discharge status: Home / Self Care | Payer: MEDICAID | Attending: Orthopedic Surgery | Admitting: Orthopedic Surgery

## 2024-02-22 ENCOUNTER — Other Ambulatory Visit: Payer: Self-pay

## 2024-02-22 ENCOUNTER — Other Ambulatory Visit: Payer: Self-pay | Admitting: Orthopedic Surgery

## 2024-02-22 ENCOUNTER — Encounter (HOSPITAL_BASED_OUTPATIENT_CLINIC_OR_DEPARTMENT_OTHER)
Admission: RE | Disposition: A | Discharge status: Home / Self Care | Payer: Self-pay | Source: Home / Self Care | Attending: Orthopedic Surgery

## 2024-02-22 DIAGNOSIS — X58XXXA Exposure to other specified factors, initial encounter: Secondary | ICD-10-CM | POA: Insufficient documentation

## 2024-02-22 DIAGNOSIS — I7300 Raynaud's syndrome without gangrene: Secondary | ICD-10-CM | POA: Diagnosis not present

## 2024-02-22 DIAGNOSIS — Z87891 Personal history of nicotine dependence: Secondary | ICD-10-CM | POA: Diagnosis not present

## 2024-02-22 DIAGNOSIS — I998 Other disorder of circulatory system: Secondary | ICD-10-CM | POA: Diagnosis present

## 2024-02-22 DIAGNOSIS — S61201A Unspecified open wound of left index finger without damage to nail, initial encounter: Secondary | ICD-10-CM | POA: Diagnosis not present

## 2024-02-22 DIAGNOSIS — E039 Hypothyroidism, unspecified: Secondary | ICD-10-CM | POA: Diagnosis not present

## 2024-02-22 DIAGNOSIS — I10 Essential (primary) hypertension: Secondary | ICD-10-CM | POA: Insufficient documentation

## 2024-02-22 HISTORY — PX: AMPUTATION FINGER: SHX6594

## 2024-02-22 SURGERY — AMPUTATION, FINGER
Anesthesia: General | Site: Index Finger | Laterality: Left

## 2024-02-22 MED ORDER — ONDANSETRON HCL 4 MG/2ML IJ SOLN
INTRAMUSCULAR | Status: DC | PRN
  Administered 2024-02-22: 4 mg via INTRAVENOUS

## 2024-02-22 MED ORDER — LACTATED RINGERS IV SOLN
INTRAVENOUS | Status: DC | PRN
Start: 2024-02-22 — End: 2024-02-22

## 2024-02-22 MED ORDER — OXYCODONE HCL 5 MG PO TABS: 5.0000 mg | ORAL_TABLET | Freq: Four times a day (QID) | ORAL | 0 refills | Status: DC | PRN

## 2024-02-22 MED ORDER — CEFAZOLIN SODIUM-DEXTROSE 2-3 GM-%(50ML) IV SOLR
INTRAVENOUS | Status: DC | PRN
  Administered 2024-02-22: 2 g via INTRAVENOUS

## 2024-02-22 MED ORDER — ONDANSETRON HCL 4 MG/2ML IJ SOLN
INTRAMUSCULAR | Status: AC
  Filled 2024-02-22: qty 2

## 2024-02-22 MED ORDER — PROPOFOL 10 MG/ML IV BOLUS
INTRAVENOUS | Status: AC
  Filled 2024-02-22: qty 20

## 2024-02-22 MED ORDER — FENTANYL CITRATE (PF) 100 MCG/2ML IJ SOLN
INTRAMUSCULAR | Status: DC | PRN
  Administered 2024-02-22 (×2): 50 ug via INTRAVENOUS

## 2024-02-22 MED ORDER — BUPIVACAINE HCL (PF) 0.25 % IJ SOLN
INTRAMUSCULAR | Status: DC | PRN
Start: 2024-02-22 — End: 2024-02-22
  Administered 2024-02-22: 9 mL

## 2024-02-22 MED ORDER — DEXAMETHASONE SODIUM PHOSPHATE 10 MG/ML IJ SOLN
INTRAMUSCULAR | Status: DC | PRN
  Administered 2024-02-22: 10 mg via INTRAVENOUS

## 2024-02-22 MED ORDER — MIDAZOLAM HCL 2 MG/2ML IJ SOLN
INTRAMUSCULAR | Status: AC
  Filled 2024-02-22: qty 2

## 2024-02-22 MED ORDER — OXYCODONE HCL 5 MG/5ML PO SOLN: 5.0000 mg | Freq: Once | ORAL | Status: DC | PRN

## 2024-02-22 MED ORDER — EPHEDRINE SULFATE (PRESSORS) 50 MG/ML IJ SOLN
INTRAMUSCULAR | Status: DC | PRN
  Administered 2024-02-22 (×2): 5 mg via INTRAVENOUS

## 2024-02-22 MED ORDER — 0.9 % SODIUM CHLORIDE (POUR BTL) OPTIME
TOPICAL | Status: DC | PRN
  Administered 2024-02-22: 75 mL

## 2024-02-22 MED ORDER — DEXAMETHASONE SODIUM PHOSPHATE 10 MG/ML IJ SOLN
INTRAMUSCULAR | Status: AC
  Filled 2024-02-22: qty 1

## 2024-02-22 MED ORDER — ONDANSETRON HCL 4 MG/2ML IJ SOLN: 4.0000 mg | Freq: Four times a day (QID) | INTRAMUSCULAR | Status: DC | PRN

## 2024-02-22 MED ORDER — LIDOCAINE 2% (20 MG/ML) 5 ML SYRINGE
INTRAMUSCULAR | Status: AC
  Filled 2024-02-22: qty 5

## 2024-02-22 MED ORDER — LIDOCAINE HCL (CARDIAC) PF 100 MG/5ML IV SOSY
PREFILLED_SYRINGE | INTRAVENOUS | Status: DC | PRN
  Administered 2024-02-22: 60 mg via INTRAVENOUS

## 2024-02-22 MED ORDER — MIDAZOLAM HCL 5 MG/5ML IJ SOLN
INTRAMUSCULAR | Status: DC | PRN
  Administered 2024-02-22: 2 mg via INTRAVENOUS

## 2024-02-22 MED ORDER — FENTANYL CITRATE (PF) 100 MCG/2ML IJ SOLN
INTRAMUSCULAR | Status: AC
  Filled 2024-02-22: qty 2

## 2024-02-22 MED ORDER — FENTANYL CITRATE (PF) 100 MCG/2ML IJ SOLN: 25.0000 ug | INTRAMUSCULAR | Status: DC | PRN

## 2024-02-22 MED ORDER — OXYCODONE HCL 5 MG PO TABS: 5.0000 mg | ORAL_TABLET | Freq: Once | ORAL | Status: DC | PRN

## 2024-02-22 MED ORDER — BUPIVACAINE HCL (PF) 0.25 % IJ SOLN
INTRAMUSCULAR | Status: AC
  Filled 2024-02-22: qty 30

## 2024-02-22 MED ORDER — PROPOFOL 10 MG/ML IV BOLUS
INTRAVENOUS | Status: DC | PRN
  Administered 2024-02-22: 180 mg via INTRAVENOUS

## 2024-02-22 MED ORDER — CEFAZOLIN SODIUM-DEXTROSE 2-4 GM/100ML-% IV SOLN
INTRAVENOUS | Status: AC
  Filled 2024-02-22: qty 100

## 2024-02-22 SURGICAL SUPPLY — 44 items
BANDAGE GAUZE 1X75IN STRL (MISCELLANEOUS) IMPLANT
BLADE MINI RND TIP GREEN BEAV (BLADE) IMPLANT
BLADE SURG 15 STRL LF DISP TIS (BLADE) ×2 IMPLANT
BNDG COHESIVE 1X5 TAN STRL LF (GAUZE/BANDAGES/DRESSINGS) IMPLANT
BNDG COMPR ESMARK 4X3 LF (GAUZE/BANDAGES/DRESSINGS) IMPLANT
BNDG ELASTIC 2INX 5YD STR LF (GAUZE/BANDAGES/DRESSINGS) IMPLANT
BNDG ELASTIC 3INX 5YD STR LF (GAUZE/BANDAGES/DRESSINGS) IMPLANT
CHLORAPREP W/TINT 26 (MISCELLANEOUS) IMPLANT
CORD BIPOLAR FORCEPS 12FT (ELECTRODE) ×1 IMPLANT
COVER BACK TABLE 60X90IN (DRAPES) ×1 IMPLANT
COVER MAYO STAND STRL (DRAPES) ×1 IMPLANT
DRAIN PENROSE 12X.25 LTX STRL (MISCELLANEOUS) IMPLANT
DRAPE EXTREMITY T 121X128X90 (DISPOSABLE) ×1 IMPLANT
DRAPE OEC MINIVIEW 54X84 (DRAPES) IMPLANT
DRAPE SURG 17X23 STRL (DRAPES) ×1 IMPLANT
GAUZE SPONGE 4X4 12PLY STRL (GAUZE/BANDAGES/DRESSINGS) ×1 IMPLANT
GAUZE SPONGE 4X4 12PLY STRL LF (GAUZE/BANDAGES/DRESSINGS) IMPLANT
GAUZE XEROFORM 1X8 LF (GAUZE/BANDAGES/DRESSINGS) ×1 IMPLANT
GLOVE BIO SURGEON STRL SZ7 (GLOVE) IMPLANT
GLOVE BIO SURGEON STRL SZ7.5 (GLOVE) ×1 IMPLANT
GLOVE BIOGEL PI IND STRL 7.0 (GLOVE) IMPLANT
GLOVE BIOGEL PI IND STRL 7.5 (GLOVE) IMPLANT
GLOVE BIOGEL PI IND STRL 8 (GLOVE) ×1 IMPLANT
GOWN STRL REUS W/ TWL LRG LVL3 (GOWN DISPOSABLE) ×1 IMPLANT
GOWN STRL REUS W/TWL XL LVL3 (GOWN DISPOSABLE) ×1 IMPLANT
NDL HYPO 25X1 1.5 SAFETY (NEEDLE) IMPLANT
NEEDLE HYPO 25X1 1.5 SAFETY (NEEDLE) ×1 IMPLANT
NS IRRIG 1000ML POUR BTL (IV SOLUTION) ×1 IMPLANT
PACK BASIN DAY SURGERY FS (CUSTOM PROCEDURE TRAY) ×1 IMPLANT
PADDING CAST ABS COTTON 4X4 ST (CAST SUPPLIES) ×1 IMPLANT
SPIKE FLUID TRANSFER (MISCELLANEOUS) IMPLANT
SPLINT FINGER 3.25 911903 (SOFTGOODS) IMPLANT
STOCKINETTE 4X48 STRL (DRAPES) ×1 IMPLANT
SUT CHROMIC 4 0 P 3 18 (SUTURE) IMPLANT
SUT ETHILON 3 0 PS 1 (SUTURE) IMPLANT
SUT ETHILON 4 0 PS 2 18 (SUTURE) IMPLANT
SUT MERSILENE 4 0 P 3 (SUTURE) IMPLANT
SUT MON AB 5-0 P3 18 (SUTURE) IMPLANT
SUT VIC AB 4-0 P-3 18XBRD (SUTURE) IMPLANT
SYR BULB EAR ULCER 3OZ GRN STR (SYRINGE) ×1 IMPLANT
SYR CONTROL 10ML LL (SYRINGE) IMPLANT
TOWEL GREEN STERILE FF (TOWEL DISPOSABLE) ×1 IMPLANT
TRAY DSU PREP LF (CUSTOM PROCEDURE TRAY) ×1 IMPLANT
UNDERPAD 30X36 HEAVY ABSORB (UNDERPADS AND DIAPERS) ×1 IMPLANT

## 2024-02-22 NOTE — Op Note (Signed)
 NAME: [name] MEDICAL RECORD NO: [id number] DATE OF BIRTH: 12-09-1975 FACILITY: [facility] LOCATION:  SURGERY CENTER PHYSICIAN: Leiana Rund R. Kavish Lafitte, MD   OPERATIVE REPORT   DATE OF PROCEDURE: 02/22/24    PREOPERATIVE DIAGNOSIS: Left index finger painful ischemia and nonhealing wound   POSTOPERATIVE DIAGNOSIS: Left index finger painful ischemia and nonhealing wound   PROCEDURE: Left index finger amputation through distal phalanx   SURGEON:  Franky Curia, M.D.   ASSISTANT: none   ANESTHESIA:  General   INTRAVENOUS FLUIDS:  Per anesthesia flow sheet.   ESTIMATED BLOOD LOSS:  Minimal.   COMPLICATIONS:  None.   SPECIMENS: Left index finger to pathology for gross exam   TOURNIQUET TIME:    Total Tourniquet Time Documented: Upper Arm (Left) - 23 minutes Total: Upper Arm (Left) - 23 minutes    DISPOSITION:  Stable to PACU.   INDICATIONS: 48 year old female with ischemic digits.  She has ischemia of the left index finger with a nonhealing wound.  It is painful.  She has had multiple other digits requiring amputation for achievement of pain relief and healing.  She wishes to proceed with amputation of the left index finger.  Risks, benefits and alternatives of surgery were discussed including the risks of blood loss, infection, damage to nerves, vessels, tendons, ligaments, bone for surgery, need for additional surgery, complications with wound healing, continued pain, stiffness.  She voiced understanding of these risks and elected to proceed.  OPERATIVE COURSE:  After being identified preoperatively by myself,  the patient and I agreed on the procedure and site of the procedure.  The surgical site was marked.  Surgical consent had been signed. Preoperative IV antibiotic prophylaxis was given. She was transferred to the operating room and placed on the operating table in supine position with the Left upper extremity on an arm board.  General anesthesia was induced by the  anesthesiologist.  Left upper extremity was prepped and draped in normal sterile orthopedic fashion.  A surgical pause was performed between the surgeons, anesthesia, and operating room staff and all were in agreement as to the patient, procedure, and site of procedure.  Tourniquet at the proximal aspect of the extremity was inflated to 250 mmHg after exsanguination of the arm with an Esmarch bandage.  Digital block was performed with quarter percent plain Marcaine  to aid in postoperative analgesia.  Incision was made and a fishmouth style with the distal phalanx preserving intact skin but removing hardened ischemic tissue.  Amputation through the nailbed preserving nail was going to leave a very short nailbed and a likely problematic nail.  Therefore the nailbed matrix and germinal matrix were excised preserving the dorsal roof.  The bone cutters were used to amputate the finger through the distal phalanx preserving the flexor and extensor insertions.  The entirety of the germinal matrix was excised.  The rongeurs were used to smooth out the end of the bone.  The volar soft tissues were able to be brought over the end of the bone and reapproximated to the dorsal soft tissues without undue tension.  The wound was copiously irrigated with sterile saline.  Bipolar cautery was used to obtain hemostasis.  5-0 Monocryl suture was used to reapproximate the skin edges in an interrupted fashion.  Good tension-free reapproximation was obtained.  Wound was dressed with sterile Xeroform and 4 x 4's and wrapped with Coban dressing lightly.  AlumaFoam splint was placed and wrapped lightly with Coban dressing.  The tourniquet was deflated at 23  minutes.  Fingertips were pink with brisk capillary refill after deflation of tourniquet.  The operative  drapes were broken down.  The patient was awoken from anesthesia safely.  She was transferred back to the stretcher and taken to PACU in stable condition.  I will see her back in the  office in 1 week for postoperative followup.  I will give her a prescription for oxycodone  5 mg 1 p.o. every 6 hours as needed pain dispense #20.   Bralyn Espino, MD Electronically signed, 02/22/24

## 2024-02-22 NOTE — Transfer of Care (Signed)
 Immediate Anesthesia Transfer of Care Note  Patient: [name]  Procedure(s) Performed: LEFT INDEX FINGER AMPUTATION THROUGH DISTAL PHALANX (Left: Index Finger)  Patient Location: PACU  Anesthesia Type:General  Level of Consciousness: sedated  Airway & Oxygen Therapy: Patient Spontanous Breathing and Patient connected to face mask oxygen  Post-op Assessment: Report given to RN and Post -op Vital signs reviewed and stable  Post vital signs: Reviewed and stable  Last Vitals:  Vitals Value Taken Time  BP 129/72 02/22/24 14:15  Temp 36.4 C 02/22/24 14:15  Pulse 59 02/22/24 14:20  Resp 18 02/22/24 14:20  SpO2 100 % 02/22/24 14:20  Vitals shown include unfiled device data.  Last Pain:  Vitals:   02/22/24 1257  TempSrc:   PainSc: 10-Worst pain ever      Patients Stated Pain Goal: 6 (02/22/24 1257)  Complications: No notable events documented.

## 2024-02-22 NOTE — Discharge Instructions (Addendum)

## 2024-02-22 NOTE — H&P (Signed)
 [name] is an 48 y.o. female.   Chief Complaint: digital ischemia HPI: 48 yo female with left index finger ischemia and non healing wound.  It is painful to her.  She has had multiple other digits with the same issue that required amputation for healing and pain relief.  She wishes to proceed with amputation of the ischemic portion of the left index finger.  Allergies: No Known Allergies  Past Medical History:  Diagnosis Date   Anxiety    History of cervical dysplasia    20 yrs ago    Hypertension    Hypothyroidism    PTSD (post-traumatic stress disorder)    Raynaud disease    Sarcoma (HCC)     Past Surgical History:  Procedure Laterality Date   AMPUTATION Left 05/24/2023   Procedure: revision amputation left small finger;  Surgeon: Michelle Drivers, MD;  Location: [facility];  Service: Orthopedics;  Laterality: Left;  time need 45 minutes   AMPUTATION Bilateral 07/16/2023   Procedure: revision amputation with incision and drainage left ring finger and right index finger;  Surgeon: Michelle Drivers, MD;  Location: [facility];  Service: Orthopedics;  Laterality: Bilateral;   AMPUTATION Left 09/07/2023   Procedure: AMPUTATION DIGIT LEFT THUMB;  Surgeon: Michelle Drivers, MD;  Location: [facility];  Service: Orthopedics;  Laterality: Left;   AMPUTATION FINGER Right 09/27/2023   Procedure: AMPUTATION RIGHT INDEX FINGER;  Surgeon: Michelle Drivers, MD;  Location: [facility];  Service: Orthopedics;  Laterality: Right;  PARTIAL AMPUTATION LEFT INDEX FINGER   AMPUTATION FINGER Right 12/13/2023   Procedure: AMPUTATION, RIGHT RING FINGER;  Surgeon: Michelle Drivers, MD;  Location: [facility];  Service: Orthopedics;  Laterality: Right;   APPENDECTOMY     CESAREAN SECTION     INCISION AND DRAINAGE Bilateral 07/16/2023   Procedure: INCISION AND DRAINAGE OF LEFT RING AND RIGHT INDEX FINGERS;  Surgeon: Michelle Drivers, MD;  Location: [facility];  Service: Orthopedics;  Laterality: Bilateral;   LYMPH NODE BIOPSY      Family History: Family History  Problem Relation Age of Onset   Hypertension Mother    Breast cancer Mother    Diabetes Father    Hypertension Father    Breast cancer Paternal Grandmother     Social History:   reports that she quit smoking about 8 months ago. Her smoking use included cigarettes. She has never used smokeless tobacco. She reports that she does not currently use alcohol. She reports that she does not use drugs.  Medications: Medications Prior to Admission  Medication Sig Dispense Refill   amLODipine  (NORVASC ) 2.5 MG tablet Take 2.5 mg by mouth daily.     aspirin  EC 81 MG tablet Take 1 tablet (81 mg total) by mouth daily. Swallow whole. 30 tablet 12   atorvastatin  (LIPITOR) 20 MG tablet Take 1 tablet (20 mg total) by mouth daily. 30 tablet 2   doxycycline  (VIBRAMYCIN ) 50 MG capsule Take 2 capsules (100 mg total) by mouth 2 (two) times daily. (Patient not taking: Reported on 10/08/2023) 28 capsule 0   hydrOXYzine  (VISTARIL ) 25 MG capsule Take 25 mg by mouth 3 (three) times daily as needed. (Patient not taking: Reported on 10/08/2023)     meloxicam (MOBIC) 15 MG tablet Take 15 mg by mouth daily. (Patient not taking: Reported on 10/08/2023)     ondansetron  (ZOFRAN -ODT) 4 MG disintegrating tablet Take 1 tablet (4 mg total) by mouth every 8 (  eight) hours as needed for nausea or vomiting. (Patient not taking: Reported on 10/08/2023) 20 tablet 0   oxyCODONE -acetaminophen  (PERCOCET) 5-325 MG tablet Take 1 tablet by mouth every 6 (six) hours as needed. 20 tablet 0   promethazine  (PHENERGAN ) 25 MG suppository Place 1 suppository (25 mg total) rectally every 6 (six) hours as needed for nausea or vomiting. (Patient not taking: Reported on 10/08/2023) 12 each 0   traZODone  (DESYREL ) 50 MG tablet Take 1 tablet (50 mg total) by mouth at bedtime as needed for sleep. (Patient not taking: Reported on  10/08/2023) 30 tablet 0    No results found for this or any previous visit (from the past 48 hours).  No results found.    Last menstrual period 05/10/2022.  General appearance: alert, cooperative, and appears stated age Head: Normocephalic, without obvious abnormality, atraumatic Neck: supple, symmetrical, trachea midline Extremities: Intact sensation and capillary refill all digits.  +epl/fpl/io.  Dry wound at radial tip of left index finger.  Tissue hard and thickened with poor capillary flow Skin: Skin color, texture, turgor normal. No rashes or lesions Neurologic: Grossly normal Incision/Wound: as above  Assessment/Plan Left index finger painful ischemia and non healing wound.  She wishes to proceed with amputation of the distal aspect of the finger.  Risks, benefits, and alternatives of surgery have been discussed and the patient agrees with the plan of care.   [name] 02/22/2024, 12:42 PM

## 2024-02-22 NOTE — Anesthesia Procedure Notes (Addendum)
 Procedure Name: LMA Insertion Date/Time: 02/22/2024 1:29 PM  Performed by: Julieanne Fairy BROCKS, CRNAPre-anesthesia Checklist: Patient identified, Emergency Drugs available, Suction available and Patient being monitored Patient Re-evaluated:Patient Re-evaluated prior to induction Oxygen Delivery Method: Circle system utilized Preoxygenation: Pre-oxygenation with 100% oxygen Induction Type: IV induction Ventilation: Mask ventilation without difficulty LMA: LMA inserted LMA Size: 4.0 Number of attempts: 1 Airway Equipment and Method: Bite block Placement Confirmation: positive ETCO2 Tube secured with: Tape Dental Injury: Teeth and Oropharynx as per pre-operative assessment

## 2024-02-22 NOTE — Anesthesia Preprocedure Evaluation (Signed)
 Anesthesia Evaluation  Patient identified by MRN, date of birth, ID band Patient awake    Reviewed: Allergy & Precautions, H&P , NPO status , Patient's Chart, lab work & pertinent test results  Airway Mallampati: II   Neck ROM: full    Dental   Pulmonary former smoker   breath sounds clear to auscultation       Cardiovascular hypertension,  Rhythm:regular Rate:Normal     Neuro/Psych  PSYCHIATRIC DISORDERS Anxiety        GI/Hepatic   Endo/Other  Hypothyroidism    Renal/GU      Musculoskeletal   Abdominal   Peds  Hematology   Anesthesia Other Findings   Reproductive/Obstetrics                              Anesthesia Physical Anesthesia Plan  ASA: 2  Anesthesia Plan: General   Post-op Pain Management:    Induction: Intravenous  PONV Risk Score and Plan: 3 and Ondansetron , Dexamethasone , Midazolam  and Treatment may vary due to age or medical condition  Airway Management Planned: LMA  Additional Equipment:   Intra-op Plan:   Post-operative Plan: Extubation in OR  Informed Consent: I have reviewed the patients History and Physical, chart, labs and discussed the procedure including the risks, benefits and alternatives for the proposed anesthesia with the patient or authorized representative who has indicated his/her understanding and acceptance.     Dental advisory given  Plan Discussed with: CRNA, Anesthesiologist and Surgeon  Anesthesia Plan Comments:         Anesthesia Quick Evaluation

## 2024-02-23 NOTE — Anesthesia Postprocedure Evaluation (Signed)
 Anesthesia Post Note  Patient: [name]  Procedure(s) Performed: LEFT INDEX FINGER AMPUTATION THROUGH DISTAL PHALANX (Left: Index Finger)     Patient location during evaluation: PACU Anesthesia Type: General Level of consciousness: awake and alert Pain management: pain level controlled Vital Signs Assessment: post-procedure vital signs reviewed and stable Respiratory status: spontaneous breathing, nonlabored ventilation, respiratory function stable and patient connected to nasal cannula oxygen Cardiovascular status: blood pressure returned to baseline and stable Postop Assessment: no apparent nausea or vomiting Anesthetic complications: no   No notable events documented.  Last Vitals:  Vitals:   02/22/24 1445 02/22/24 1459  BP: (!) 141/87 129/86  Pulse: 66 65  Resp: 16 16  Temp:  (!) 36.3 C  SpO2: 100% 100%    Last Pain:  Vitals:   02/22/24 1459  TempSrc:   PainSc: 0-No pain                 Zaeden Lastinger S

## 2024-02-24 ENCOUNTER — Encounter (HOSPITAL_BASED_OUTPATIENT_CLINIC_OR_DEPARTMENT_OTHER): Payer: Self-pay | Admitting: Orthopedic Surgery

## 2024-02-25 LAB — SURGICAL PATHOLOGY

## 2024-03-31 ENCOUNTER — Other Ambulatory Visit: Payer: Self-pay

## 2024-03-31 ENCOUNTER — Encounter (HOSPITAL_BASED_OUTPATIENT_CLINIC_OR_DEPARTMENT_OTHER): Payer: Self-pay | Admitting: Orthopedic Surgery

## 2024-03-31 ENCOUNTER — Other Ambulatory Visit: Payer: Self-pay | Admitting: Orthopedic Surgery

## 2024-04-01 ENCOUNTER — Encounter: Payer: Self-pay | Admitting: Vascular Surgery

## 2024-04-04 ENCOUNTER — Ambulatory Visit (HOSPITAL_BASED_OUTPATIENT_CLINIC_OR_DEPARTMENT_OTHER)
Admission: RE | Admit: 2024-04-04 | Discharge: 2024-04-04 | Disposition: A | Discharge status: Home / Self Care | Payer: MEDICAID | Attending: Orthopedic Surgery | Admitting: Orthopedic Surgery

## 2024-04-04 ENCOUNTER — Encounter (HOSPITAL_BASED_OUTPATIENT_CLINIC_OR_DEPARTMENT_OTHER): Payer: Self-pay | Admitting: Orthopedic Surgery

## 2024-04-04 ENCOUNTER — Encounter (HOSPITAL_BASED_OUTPATIENT_CLINIC_OR_DEPARTMENT_OTHER)
Admission: RE | Disposition: A | Discharge status: Home / Self Care | Payer: Self-pay | Source: Home / Self Care | Attending: Orthopedic Surgery

## 2024-04-04 ENCOUNTER — Other Ambulatory Visit: Payer: Self-pay

## 2024-04-04 ENCOUNTER — Ambulatory Visit (HOSPITAL_BASED_OUTPATIENT_CLINIC_OR_DEPARTMENT_OTHER): Payer: MEDICAID | Admitting: Anesthesiology

## 2024-04-04 DIAGNOSIS — Z79899 Other long term (current) drug therapy: Secondary | ICD-10-CM | POA: Insufficient documentation

## 2024-04-04 DIAGNOSIS — I96 Gangrene, not elsewhere classified: Secondary | ICD-10-CM | POA: Diagnosis present

## 2024-04-04 DIAGNOSIS — E039 Hypothyroidism, unspecified: Secondary | ICD-10-CM | POA: Diagnosis not present

## 2024-04-04 DIAGNOSIS — I998 Other disorder of circulatory system: Secondary | ICD-10-CM | POA: Diagnosis not present

## 2024-04-04 DIAGNOSIS — I10 Essential (primary) hypertension: Secondary | ICD-10-CM | POA: Diagnosis not present

## 2024-04-04 DIAGNOSIS — Z87891 Personal history of nicotine dependence: Secondary | ICD-10-CM | POA: Insufficient documentation

## 2024-04-04 DIAGNOSIS — M86142 Other acute osteomyelitis, left hand: Secondary | ICD-10-CM | POA: Diagnosis not present

## 2024-04-04 HISTORY — DX: Unspecified disorder of circulatory system: I99.9

## 2024-04-04 HISTORY — PX: AMPUTATION FINGER: SHX6594

## 2024-04-04 SURGERY — AMPUTATION, FINGER
Anesthesia: General | Site: Index Finger | Laterality: Left

## 2024-04-04 MED ORDER — FENTANYL CITRATE (PF) 100 MCG/2ML IJ SOLN
INTRAMUSCULAR | Status: DC | PRN
  Administered 2024-04-04: 25 ug via INTRAVENOUS
  Administered 2024-04-04: 50 ug via INTRAVENOUS
  Administered 2024-04-04: 25 ug via INTRAVENOUS

## 2024-04-04 MED ORDER — BUPIVACAINE HCL (PF) 0.25 % IJ SOLN
INTRAMUSCULAR | Status: DC | PRN
  Administered 2024-04-04: 9 mL

## 2024-04-04 MED ORDER — ONDANSETRON HCL 4 MG/2ML IJ SOLN
INTRAMUSCULAR | Status: DC | PRN
  Administered 2024-04-04: 4 mg via INTRAVENOUS

## 2024-04-04 MED ORDER — MIDAZOLAM HCL 2 MG/2ML IJ SOLN
INTRAMUSCULAR | Status: AC
  Filled 2024-04-04: qty 2

## 2024-04-04 MED ORDER — CEFAZOLIN SODIUM-DEXTROSE 2-4 GM/100ML-% IV SOLN
INTRAVENOUS | Status: AC
  Filled 2024-04-04: qty 100

## 2024-04-04 MED ORDER — OXYCODONE HCL 5 MG PO TABS: 5.0000 mg | ORAL_TABLET | Freq: Once | ORAL | Status: DC | PRN

## 2024-04-04 MED ORDER — FENTANYL CITRATE (PF) 100 MCG/2ML IJ SOLN
INTRAMUSCULAR | Status: AC
  Filled 2024-04-04: qty 2

## 2024-04-04 MED ORDER — DEXMEDETOMIDINE HCL IN NACL 80 MCG/20ML IV SOLN
INTRAVENOUS | Status: AC
  Filled 2024-04-04: qty 20

## 2024-04-04 MED ORDER — LIDOCAINE 2% (20 MG/ML) 5 ML SYRINGE
INTRAMUSCULAR | Status: AC
  Filled 2024-04-04: qty 5

## 2024-04-04 MED ORDER — OXYCODONE HCL 5 MG/5ML PO SOLN: 5.0000 mg | Freq: Once | ORAL | Status: DC | PRN

## 2024-04-04 MED ORDER — LACTATED RINGERS IV SOLN
INTRAVENOUS | Status: DC
Start: 2024-04-04 — End: 2024-04-04

## 2024-04-04 MED ORDER — PROPOFOL 10 MG/ML IV BOLUS
INTRAVENOUS | Status: DC | PRN
  Administered 2024-04-04: 200 mg via INTRAVENOUS

## 2024-04-04 MED ORDER — OXYCODONE-ACETAMINOPHEN 5-325 MG PO TABS: 1.0000 | ORAL_TABLET | Freq: Four times a day (QID) | ORAL | 0 refills | Status: AC | PRN

## 2024-04-04 MED ORDER — FENTANYL CITRATE (PF) 100 MCG/2ML IJ SOLN: 25.0000 ug | INTRAMUSCULAR | Status: DC | PRN

## 2024-04-04 MED ORDER — LIDOCAINE 2% (20 MG/ML) 5 ML SYRINGE
INTRAMUSCULAR | Status: DC | PRN
  Administered 2024-04-04: 50 mg via INTRAVENOUS

## 2024-04-04 MED ORDER — ACETAMINOPHEN 10 MG/ML IV SOLN: 1000.0000 mg | Freq: Once | INTRAVENOUS | Status: DC | PRN

## 2024-04-04 MED ORDER — CEFAZOLIN SODIUM-DEXTROSE 2-4 GM/100ML-% IV SOLN
2.0000 g | INTRAVENOUS | Status: AC
  Administered 2024-04-04: 2 g via INTRAVENOUS

## 2024-04-04 MED ORDER — DEXAMETHASONE SODIUM PHOSPHATE 10 MG/ML IJ SOLN
INTRAMUSCULAR | Status: DC | PRN
  Administered 2024-04-04: 5 mg via INTRAVENOUS

## 2024-04-04 MED ORDER — ONDANSETRON HCL 4 MG/2ML IJ SOLN
INTRAMUSCULAR | Status: AC
Start: 2024-04-04 — End: 2024-04-04
  Filled 2024-04-04: qty 2

## 2024-04-04 MED ORDER — PROPOFOL 10 MG/ML IV BOLUS
INTRAVENOUS | Status: AC
  Filled 2024-04-04: qty 20

## 2024-04-04 MED ORDER — 0.9 % SODIUM CHLORIDE (POUR BTL) OPTIME
TOPICAL | Status: DC | PRN
  Administered 2024-04-04: 1000 mL

## 2024-04-04 MED ORDER — DROPERIDOL 2.5 MG/ML IJ SOLN: 0.6250 mg | Freq: Once | INTRAMUSCULAR | Status: DC | PRN

## 2024-04-04 MED ORDER — MIDAZOLAM HCL 2 MG/2ML IJ SOLN
INTRAMUSCULAR | Status: DC | PRN
  Administered 2024-04-04: 2 mg via INTRAVENOUS

## 2024-04-04 MED ORDER — DEXAMETHASONE SODIUM PHOSPHATE 10 MG/ML IJ SOLN
INTRAMUSCULAR | Status: AC
Start: 2024-04-04 — End: 2024-04-04
  Filled 2024-04-04: qty 1

## 2024-04-04 SURGICAL SUPPLY — 40 items
BANDAGE GAUZE 1X75IN STRL (MISCELLANEOUS) IMPLANT
BLADE MINI RND TIP GREEN BEAV (BLADE) IMPLANT
BLADE SURG 15 STRL LF DISP TIS (BLADE) ×2 IMPLANT
BNDG COHESIVE 1X5 TAN STRL LF (GAUZE/BANDAGES/DRESSINGS) IMPLANT
BNDG COMPR ESMARK 4X3 LF (GAUZE/BANDAGES/DRESSINGS) IMPLANT
BNDG ELASTIC 2INX 5YD STR LF (GAUZE/BANDAGES/DRESSINGS) IMPLANT
BNDG ELASTIC 3INX 5YD STR LF (GAUZE/BANDAGES/DRESSINGS) IMPLANT
CHLORAPREP W/TINT 26 (MISCELLANEOUS) IMPLANT
CORD BIPOLAR FORCEPS 12FT (ELECTRODE) ×1 IMPLANT
COVER BACK TABLE 60X90IN (DRAPES) ×1 IMPLANT
COVER MAYO STAND STRL (DRAPES) ×1 IMPLANT
DRAIN PENROSE 12X.25 LTX STRL (MISCELLANEOUS) IMPLANT
DRAPE EXTREMITY T 121X128X90 (DISPOSABLE) ×1 IMPLANT
DRAPE OEC MINIVIEW 54X84 (DRAPES) IMPLANT
DRAPE SURG 17X23 STRL (DRAPES) ×1 IMPLANT
GAUZE SPONGE 4X4 12PLY STRL (GAUZE/BANDAGES/DRESSINGS) ×1 IMPLANT
GAUZE SPONGE 4X4 12PLY STRL LF (GAUZE/BANDAGES/DRESSINGS) IMPLANT
GAUZE XEROFORM 1X8 LF (GAUZE/BANDAGES/DRESSINGS) ×1 IMPLANT
GLOVE BIO SURGEON STRL SZ7.5 (GLOVE) ×1 IMPLANT
GLOVE BIOGEL PI IND STRL 8 (GLOVE) ×1 IMPLANT
GOWN STRL REUS W/ TWL LRG LVL3 (GOWN DISPOSABLE) ×1 IMPLANT
GOWN STRL REUS W/TWL XL LVL3 (GOWN DISPOSABLE) ×1 IMPLANT
NDL HYPO 25X1 1.5 SAFETY (NEEDLE) IMPLANT
NEEDLE HYPO 25X1 1.5 SAFETY (NEEDLE) IMPLANT
NS IRRIG 1000ML POUR BTL (IV SOLUTION) ×1 IMPLANT
PACK BASIN DAY SURGERY FS (CUSTOM PROCEDURE TRAY) ×1 IMPLANT
PADDING CAST ABS COTTON 4X4 ST (CAST SUPPLIES) ×1 IMPLANT
SPIKE FLUID TRANSFER (MISCELLANEOUS) IMPLANT
STOCKINETTE 4X48 STRL (DRAPES) ×1 IMPLANT
SUT CHROMIC 4 0 P 3 18 (SUTURE) IMPLANT
SUT ETHILON 3 0 PS 1 (SUTURE) IMPLANT
SUT ETHILON 4 0 PS 2 18 (SUTURE) IMPLANT
SUT MERSILENE 4 0 P 3 (SUTURE) IMPLANT
SUT MON AB 5-0 P3 18 (SUTURE) IMPLANT
SUT VIC AB 4-0 P-3 18XBRD (SUTURE) IMPLANT
SYR BULB EAR ULCER 3OZ GRN STR (SYRINGE) ×1 IMPLANT
SYR CONTROL 10ML LL (SYRINGE) IMPLANT
TOWEL GREEN STERILE FF (TOWEL DISPOSABLE) ×1 IMPLANT
TRAY DSU PREP LF (CUSTOM PROCEDURE TRAY) ×1 IMPLANT
UNDERPAD 30X36 HEAVY ABSORB (UNDERPADS AND DIAPERS) ×1 IMPLANT

## 2024-04-04 NOTE — Anesthesia Procedure Notes (Signed)
 Procedure Name: LMA Insertion Date/Time: 04/04/2024 12:19 PM  Performed by: Delayne Olam BIRCH, CRNAPre-anesthesia Checklist: Patient identified, Emergency Drugs available, Suction available and Patient being monitored Patient Re-evaluated:Patient Re-evaluated prior to induction Oxygen Delivery Method: Circle system utilized Preoxygenation: Pre-oxygenation with 100% oxygen Induction Type: IV induction Ventilation: Mask ventilation without difficulty LMA: LMA inserted LMA Size: 4.0 Number of attempts: 1 Airway Equipment and Method: Bite block Placement Confirmation: positive ETCO2 Tube secured with: Tape Dental Injury: Teeth and Oropharynx as per pre-operative assessment

## 2024-04-04 NOTE — Anesthesia Preprocedure Evaluation (Addendum)
 Anesthesia Evaluation  Patient identified by MRN, date of birth, ID band Patient awake    Reviewed: Allergy & Precautions, NPO status , Patient's Chart, lab work & pertinent test results  Airway Mallampati: I  TM Distance: >3 FB Neck ROM: Full    Dental  (+) Teeth Intact, Dental Advisory Given   Pulmonary former smoker   breath sounds clear to auscultation       Cardiovascular hypertension, Pt. on medications  Rhythm:Regular Rate:Normal     Neuro/Psych  PSYCHIATRIC DISORDERS Anxiety     negative neurological ROS     GI/Hepatic negative GI ROS, Neg liver ROS,,,  Endo/Other  Hypothyroidism    Renal/GU negative Renal ROS     Musculoskeletal negative musculoskeletal ROS (+)    Abdominal   Peds  Hematology negative hematology ROS (+)   Anesthesia Other Findings   Reproductive/Obstetrics                              Anesthesia Physical Anesthesia Plan  ASA: 2  Anesthesia Plan: General   Post-op Pain Management: Tylenol  PO (pre-op)* and Toradol  IV (intra-op)*   Induction: Intravenous  PONV Risk Score and Plan: 4 or greater and Ondansetron , Dexamethasone , Midazolam  and Scopolamine patch - Pre-op  Airway Management Planned: LMA  Additional Equipment: None  Intra-op Plan:   Post-operative Plan: Extubation in OR  Informed Consent: I have reviewed the patients History and Physical, chart, labs and discussed the procedure including the risks, benefits and alternatives for the proposed anesthesia with the patient or authorized representative who has indicated his/her understanding and acceptance.     Dental advisory given  Plan Discussed with: CRNA  Anesthesia Plan Comments:          Anesthesia Quick Evaluation

## 2024-04-04 NOTE — Op Note (Signed)
 NAME: [name] MEDICAL RECORD NO: [id number] DATE OF BIRTH: 09-29-1975 FACILITY: [facility] LOCATION: [facility] PHYSICIAN: Kalyna Paolella R. Tabor Bartram, MD   OPERATIVE REPORT   DATE OF PROCEDURE: 04/04/24    PREOPERATIVE DIAGNOSIS: Left index finger ischemia with necrosis   POSTOPERATIVE DIAGNOSIS: Left index finger ischemia with necrosis   PROCEDURE: Left index finger amputation through middle phalanx   SURGEON:  Franky Curia, M.D.   ASSISTANT: none   ANESTHESIA:  General   INTRAVENOUS FLUIDS:  Per anesthesia flow sheet.   ESTIMATED BLOOD LOSS:  Minimal.   COMPLICATIONS:  None.   SPECIMENS: Left index finger pathology for gross exam only   TOURNIQUET TIME:    Total Tourniquet Time Documented: Upper Arm (Left) - 24 minutes Total: Upper Arm (Left) - 24 minutes    DISPOSITION:  Stable to PACU.   INDICATIONS: 48 year old female with ischemic necrosis of multiple digits.  She has had amputation of portion of the index finger previously.  The wound has failed to heal and continues to be painful.  She wishes to proceed with further amputation.  Risks, benefits and alternatives of surgery were discussed including the risks of blood loss, infection, damage to nerves, vessels, tendons, ligaments, bone for surgery, need for additional surgery, complications with wound healing, continued pain, stiffness.  She voiced understanding of these risks and elected to proceed.  OPERATIVE COURSE:  After being identified preoperatively by myself,  the patient and I agreed on the procedure and site of the procedure.  The surgical site was marked.  Surgical consent had been signed. Preoperative IV antibiotic prophylaxis was given. She was transferred to the operating room and placed on the operating table in supine position with the Left upper extremity on an arm board.  General anesthesia was induced by the anesthesiologist.  Left upper extremity was prepped and draped in normal sterile  orthopedic fashion.  A surgical pause was performed between the surgeons, anesthesia, and operating room staff and all were in agreement as to the patient, procedure, and site of procedure.  Tourniquet at the proximal aspect of the extremity was inflated to 250 mmHg after exsanguination of the arm with an Esmarch bandage.  A fishmouth style incision was made at the index finger removing any ischemic looking skin.  This was carried in subcutaneous tissues by spreading technique.  Radial and ulnar digital nerve and artery were identified and placed under traction.  These were bipolared and allowed to retract.  The soft tissues were sharply incised down to the bone.  The bone cutters were used to amputate through the distal aspect of the middle phalanx.  Fragmented pieces of bone were removed.  Rongeurs were used to round off the end of the bone.  The wound was irrigated with sterile saline and closed with 5-0 Monocryl in an interrupted fashion.  Good tension-free approximation of skin edges was obtained.  Digital block was performed with quarter percent plain Marcaine  to aid in postoperative analgesia.  Wound was dressed with sterile Xeroform 4 x 4's and wrapped with a Coban dressing lightly.  AlumaFoam splint was placed and wrapped lightly with Coban dressing.  The tourniquet was deflated at 24 minutes.  Fingertips were pink with brisk capillary refill after deflation of tourniquet.  The operative  drapes were broken down.  The patient was awoken from anesthesia safely.  She was transferred back to the stretcher and taken to PACU in stable condition.  I will see her back in the office in  1 week for postoperative followup.  I will give her a prescription for Percocet 5/325 1 tab PO q6 hours prn pain, dispense #15.   Isao Seltzer, MD Electronically signed, 04/04/24

## 2024-04-04 NOTE — Anesthesia Postprocedure Evaluation (Signed)
 Anesthesia Post Note  Patient: [name]  Procedure(s) Performed: AMPUTATION, FINGER (Left: Index Finger)     Patient location during evaluation: PACU Anesthesia Type: General Level of consciousness: awake and alert Pain management: pain level controlled Vital Signs Assessment: post-procedure vital signs reviewed and stable Respiratory status: spontaneous breathing, nonlabored ventilation, respiratory function stable and patient connected to nasal cannula oxygen Cardiovascular status: blood pressure returned to baseline and stable Postop Assessment: no apparent nausea or vomiting Anesthetic complications: no   No notable events documented.  Last Vitals:  Vitals:   04/04/24 1315 04/04/24 1330  BP: 115/84 123/78  Pulse: 60 64  Resp: 16 17  Temp:    SpO2: 100% 100%    Last Pain:  Vitals:   04/04/24 1330  TempSrc:   PainSc: 0-No pain                 Franky JONETTA Bald

## 2024-04-04 NOTE — Discharge Instructions (Addendum)

## 2024-04-04 NOTE — Transfer of Care (Signed)
 Immediate Anesthesia Transfer of Care Note  Patient: [name]  Procedure(s) Performed: Procedure(s) (LRB): AMPUTATION, FINGER (Left)  Patient Location: PACU  Anesthesia Type: General  Level of Consciousness: awake, oriented, sedated and patient cooperative  Airway & Oxygen Therapy: Patient Spontanous Breathing Room Air  Post-op Assessment: Report given to PACU RN and Post -op Vital signs reviewed and stable  Post vital signs: Reviewed and stable  Complications: No apparent anesthesia complications  Last Vitals:  Vitals Value Taken Time  BP 128/82 04/04/24 13:08  Temp 36.2 C 04/04/24 13:08  Pulse 57 04/04/24 13:08  Resp 17 04/04/24 13:08  SpO2 100 % 04/04/24 13:08    Last Pain:  Vitals:   04/04/24 1109  TempSrc: Temporal  PainSc: 0-No pain         Complications: No notable events documented.

## 2024-04-04 NOTE — H&P (Signed)
 [name] is an 48 y.o. female.   Chief Complaint: finger gangrene HPI: 48 yo female with ischemic necrosis of left index finger after amputation.  It is painful to her.  She wishes to proceed with further amputation of the finger.  Allergies: No Known Allergies  Past Medical History:  Diagnosis Date   Anxiety    History of cervical dysplasia    20 yrs ago    Hypertension    Hypothyroidism    PTSD (post-traumatic stress disorder)    Raynaud disease    Sarcoma (HCC)    Vasculopathy     Past Surgical History:  Procedure Laterality Date   AMPUTATION Left 05/24/2023   Procedure: revision amputation left small finger;  Surgeon: Murrell Drivers, MD;  Location: [facility];  Service: Orthopedics;  Laterality: Left;  time need 45 minutes   AMPUTATION Bilateral 07/16/2023   Procedure: revision amputation with incision and drainage left ring finger and right index finger;  Surgeon: Murrell Drivers, MD;  Location: [facility];  Service: Orthopedics;  Laterality: Bilateral;   AMPUTATION Left 09/07/2023   Procedure: AMPUTATION DIGIT LEFT THUMB;  Surgeon: Murrell Drivers, MD;  Location: [facility];  Service: Orthopedics;  Laterality: Left;   AMPUTATION FINGER Right 09/27/2023   Procedure: AMPUTATION RIGHT INDEX FINGER;  Surgeon: Murrell Drivers, MD;  Location: [facility];  Service: Orthopedics;  Laterality: Right;  PARTIAL AMPUTATION LEFT INDEX FINGER   AMPUTATION FINGER Right 12/13/2023   Procedure: AMPUTATION, RIGHT RING FINGER;  Surgeon: Murrell Drivers, MD;  Location: [facility];  Service: Orthopedics;  Laterality: Right;   AMPUTATION FINGER Left 02/22/2024   Procedure: LEFT INDEX FINGER AMPUTATION THROUGH DISTAL PHALANX;  Surgeon: Murrell Drivers, MD;  Location: [facility];  Service: Orthopedics;  Laterality: Left;  LEFT INDEX FINGER AMPUTATION   APPENDECTOMY     CESAREAN SECTION     INCISION AND DRAINAGE Bilateral  07/16/2023   Procedure: INCISION AND DRAINAGE OF LEFT RING AND RIGHT INDEX FINGERS;  Surgeon: Murrell Drivers, MD;  Location: [facility];  Service: Orthopedics;  Laterality: Bilateral;   LYMPH NODE BIOPSY      Family History: Family History  Problem Relation Age of Onset   Hypertension Mother    Breast cancer Mother    Diabetes Father    Hypertension Father    Breast cancer Paternal Grandmother     Social History:   reports that she quit smoking about 9 months ago. Her smoking use included cigarettes. She has never used smokeless tobacco. She reports that she does not currently use alcohol. She reports that she does not use drugs.  Medications: Medications Prior to Admission  Medication Sig Dispense Refill   amLODipine  (NORVASC ) 2.5 MG tablet Take 2.5 mg by mouth daily.     oxyCODONE  (ROXICODONE ) 5 MG immediate release tablet Take 1 tablet (5 mg total) by mouth every 6 (six) hours as needed. 20 tablet 0    No results found for this or any previous visit (from the past 48 hours).  No results found.    Blood pressure (!) 131/93, pulse 90, temperature (!) 97.5 F (36.4 C), temperature source Temporal, resp. rate 16, height 5' 3 (1.6 m), weight 50.5 kg, last menstrual period 05/10/2022, SpO2 95%.  General appearance: alert, cooperative, and appears stated age Head: Normocephalic, without obvious abnormality, atraumatic Neck: supple, symmetrical, trachea midline Extremities: Intact sensation and capillary refill all digits.  +epl/fpl/io.  Non healing  wound of left index finger with ischemia Skin: Skin color, texture, turgor normal. No rashes or lesions Neurologic: Grossly normal Incision/Wound: as above  Assessment/Plan Left index finger ischemic necrosis.  Non operative and operative treatment options have been discussed with the patient and patient wishes to proceed with operative treatment. Risks, benefits, and alternatives of surgery have been discussed and the  patient agrees with the plan of care.   [name] 04/04/2024, 12:02 PM

## 2024-04-05 ENCOUNTER — Encounter (HOSPITAL_BASED_OUTPATIENT_CLINIC_OR_DEPARTMENT_OTHER): Payer: Self-pay | Admitting: Orthopedic Surgery

## 2024-04-08 LAB — SURGICAL PATHOLOGY

## 2024-04-09 LAB — AEROBIC/ANAEROBIC CULTURE W GRAM STAIN (SURGICAL/DEEP WOUND): Gram Stain: NONE SEEN

## 2024-04-18 ENCOUNTER — Telehealth: Payer: Self-pay | Admitting: Vascular Surgery

## 2024-05-12 NOTE — Telephone Encounter (Signed)
 Pt appt scheduled

## 2024-05-16 ENCOUNTER — Other Ambulatory Visit: Payer: Self-pay | Admitting: *Deleted

## 2024-05-16 DIAGNOSIS — M79603 Pain in arm, unspecified: Secondary | ICD-10-CM

## 2024-05-16 DIAGNOSIS — S61209A Unspecified open wound of unspecified finger without damage to nail, initial encounter: Secondary | ICD-10-CM

## 2024-05-26 NOTE — Progress Notes (Signed)
 [name] DOB: [birth year] MRN: [id number]  [name] is being seen in follow up for left index finger amputation for ischemia and necrosis.  She is 7 weeks postop.  She states the wounds are doing well overall.  All of her fingers have healed.  She has noted with colder weather that they are starting to become painful however.  No skin breakdown at this time.  Examination: Wound of the index finger is nicely healed.  No erythema or sign of infection.  Her other digits are without wounds.  She has noted a residual nail on the right hand but this comes off easily and does not bother her.  Assessment and plan: Status post above-noted procedures.  Plan to see her back in 5 weeks.  She requested a refill for pain medication given that the ischemia is starting to bother her.  Will try Lyrica 50 mg p.o. twice daily and see if this is adequate rather than narcotic pain medications.  Will also try getting her into pain management to help with this chronic problem.  I will check her back in 5 weeks.  She agrees with the plan of care.

## 2024-05-29 ENCOUNTER — Encounter: Payer: Self-pay | Admitting: *Deleted

## 2024-05-29 ENCOUNTER — Other Ambulatory Visit: Payer: Self-pay | Admitting: *Deleted

## 2024-05-29 DIAGNOSIS — S61209A Unspecified open wound of unspecified finger without damage to nail, initial encounter: Secondary | ICD-10-CM

## 2024-05-29 DIAGNOSIS — M79603 Pain in arm, unspecified: Secondary | ICD-10-CM

## 2024-05-29 NOTE — H&P (View-Only) (Signed)
 Patient ID: [name], female   DOB: 11/07/1975, 48 y.o.   MRN: [id number]  Reason for Consult: No chief complaint on file.   Referred by [facility], I*  Subjective:     HPI [name] is a 48 y.o. female presenting for follow-up.  I saw her about a year ago for bilateral finger pain and wounds.  She had a normal pulse exam and a duplex that demonstrated triphasic flow throughout the upper extremity arterial supply.  We discussed that she likely has a microvascular issue.  She is seen rheumatology and has since undergone multiple finger amputations with Dr. Murrell. She continues to have finger pain.  She is seeing a rheumatologist and sounds like she has undergone fairly extensive workup but this was at [facility] and I do not have the records.  She was told that she has severe Raynaud's and was put on amlodipine .  She has not seen much relief and has necessitated finger amputations.  Past Medical History:  Diagnosis Date   Anxiety    History of cervical dysplasia    20 yrs ago    Hypertension    Hypothyroidism    PTSD (post-traumatic stress disorder)    Raynaud disease    Sarcoma (HCC)    Vasculopathy    Family History  Problem Relation Age of Onset   Hypertension Mother    Breast cancer Mother    Diabetes Father    Hypertension Father    Breast cancer Paternal Grandmother    Past Surgical History:  Procedure Laterality Date   AMPUTATION Left 05/24/2023   Procedure: revision amputation left small finger;  Surgeon: Murrell Drivers, MD;  Location: [facility];  Service: Orthopedics;  Laterality: Left;  time need 45 minutes   AMPUTATION Bilateral 07/16/2023   Procedure: revision amputation with incision and drainage left ring finger and right index finger;  Surgeon: Murrell Drivers, MD;  Location: [facility];  Service: Orthopedics;  Laterality: Bilateral;   AMPUTATION Left 09/07/2023   Procedure: AMPUTATION DIGIT LEFT THUMB;  Surgeon:  Murrell Drivers, MD;  Location: [facility];  Service: Orthopedics;  Laterality: Left;   AMPUTATION FINGER Right 09/27/2023   Procedure: AMPUTATION RIGHT INDEX FINGER;  Surgeon: Murrell Drivers, MD;  Location: [facility];  Service: Orthopedics;  Laterality: Right;  PARTIAL AMPUTATION LEFT INDEX FINGER   AMPUTATION FINGER Right 12/13/2023   Procedure: AMPUTATION, RIGHT RING FINGER;  Surgeon: Murrell Drivers, MD;  Location: [facility];  Service: Orthopedics;  Laterality: Right;   AMPUTATION FINGER Left 02/22/2024   Procedure: LEFT INDEX FINGER AMPUTATION THROUGH DISTAL PHALANX;  Surgeon: Murrell Drivers, MD;  Location: [facility];  Service: Orthopedics;  Laterality: Left;  LEFT INDEX FINGER AMPUTATION   AMPUTATION FINGER Left 04/04/2024   Procedure: AMPUTATION, FINGER;  Surgeon: Murrell Drivers, MD;  Location: [facility];  Service: Orthopedics;  Laterality: Left;   APPENDECTOMY     CESAREAN SECTION     INCISION AND DRAINAGE Bilateral 07/16/2023   Procedure: INCISION AND DRAINAGE OF LEFT RING AND RIGHT INDEX FINGERS;  Surgeon: Murrell Drivers, MD;  Location: [facility];  Service: Orthopedics;  Laterality: Bilateral;   LYMPH NODE BIOPSY      Short Social History:  Social History   Tobacco Use   Smoking status: Former    Current packs/day: 0.00    Types: Cigarettes    Quit date: 06/11/2023    Years since  quitting: 0.9   Smokeless tobacco: Never  Substance Use Topics   Alcohol use: Not Currently    No Known Allergies  Current Outpatient Medications  Medication Sig Dispense Refill   amLODipine  (NORVASC ) 2.5 MG tablet Take 2.5 mg by mouth daily.     oxyCODONE -acetaminophen  (PERCOCET) 5-325 MG tablet Take 1 tablet by mouth every 6 (six) hours as needed for severe pain (pain score 7-10). 15 tablet 0   No current facility-administered medications for this visit.    REVIEW OF SYSTEMS  All other systems were reviewed and are  negative     Objective:  Objective   There were no vitals filed for this visit. There is no height or weight on file to calculate BMI.  Physical Exam General: no acute distress Cardiac: hemodynamically stable Extremities: Multiple healed distal finger amputations, thickened darkened skin throughout fingers. Vascular:   Right: Palpable radial, ulnar, brachial  Left: Palpable radial, ulnar, brachial  Data: Extremity Doppler  +---------------+----------+---------+--------+--------+  Site          PSV (cm/s)Waveform StenosisComments  +---------------+----------+---------+--------+--------+  Subclavian Prox100       triphasic                  +---------------+----------+---------+--------+--------+  Subclavian Mid 77        triphasic                  +---------------+----------+---------+--------+--------+  Subclavian Dist67        triphasic                  +---------------+----------+---------+--------+--------+  Axillary      64        triphasic                  +---------------+----------+---------+--------+--------+  Brachial Prox  75        triphasic                  +---------------+----------+---------+--------+--------+  Brachial Mid   89        triphasic                  +---------------+----------+---------+--------+--------+  Brachial Dist  77        triphasic                  +---------------+----------+---------+--------+--------+  Radial Prox    46        triphasic                  +---------------+----------+---------+--------+--------+  Radial Mid     43        triphasic                  +---------------+----------+---------+--------+--------+  Radial Dist    40        triphasic                  +---------------+----------+---------+--------+--------+  Ulnar Prox     44        triphasic                  +---------------+----------+---------+--------+--------+  Ulnar Mid      46         triphasic                  +---------------+----------+---------+--------+--------+  Ulnar Dist     28        triphasic                  +---------------+----------+---------+--------+--------+  Left Doppler Findings:  +---------------+----------+---------+--------+--------+  Site          PSV (cm/s)Waveform StenosisComments  +---------------+----------+---------+--------+--------+  Subclavian Prox119       triphasic                  +---------------+----------+---------+--------+--------+  Subclavian Mid 87        triphasic                  +---------------+----------+---------+--------+--------+  Subclavian Dist73        triphasic                  +---------------+----------+---------+--------+--------+  Axillary      63        triphasic                  +---------------+----------+---------+--------+--------+  Brachial Prox  86        triphasic                  +---------------+----------+---------+--------+--------+  Brachial Mid   79        triphasic                  +---------------+----------+---------+--------+--------+  Brachial Dist  70        triphasic                  +---------------+----------+---------+--------+--------+  Radial Prox    56        triphasic                  +---------------+----------+---------+--------+--------+  Radial Mid     52        triphasic                  +---------------+----------+---------+--------+--------+  Radial Dist    57        triphasic                  +---------------+----------+---------+--------+--------+  Ulnar Prox     72        triphasic                  +---------------+----------+---------+--------+--------+  Ulnar Mid      67        triphasic                  +---------------+----------+---------+--------+--------+  Ulnar Dist     60        triphasic                  +---------------+----------+---------+--------+--------+       Assessment/Plan:   [name] is a 47 y.o. female with finger pain, ulceration who has undergone multiple amputations. I explained within normal pulses and vascular studies that do not demonstrate any flow-limiting stenosis that she likely has microvascular disease but but given her severity necessitating finger amputations I offered angiogram of bilateral upper extremities in order to fully rule out any intervenable stenosis in the chest or arms.  We discussed the risks and benefits and I explained that we would plan for a right groin access. She elected to proceed. 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 The patient is on best medical therapy for peripheral arterial disease. The patient has been counseled about the risks of tobacco use in atherosclerotic disease. The  patient has been counseled to abstain from any tobacco use. An aortogram with bilateral upper extremity runoff angiography and Bilateral upper extremity intervention and is indicated to better evaluate the patient's lower extremity circulation because of the limb and digit threatening nature of the patient's diagnosis. Based on the patient's clinical exam and non-invasive data, we anticipate an endovascular intervention in the aortic arch and upper extremity vessels.  Plan for bilateral upper extremity angiogram from right groin access  Norman GORMAN Serve MD [facility]

## 2024-05-29 NOTE — Progress Notes (Signed)
 Patient ID: [name], female   DOB: 11/07/1975, 48 y.o.   MRN: [id number]  Reason for Consult: No chief complaint on file.   Referred by [facility], I*  Subjective:     HPI [name] is a 48 y.o. female presenting for follow-up.  I saw her about a year ago for bilateral finger pain and wounds.  She had a normal pulse exam and a duplex that demonstrated triphasic flow throughout the upper extremity arterial supply.  We discussed that she likely has a microvascular issue.  She is seen rheumatology and has since undergone multiple finger amputations with Dr. Murrell. She continues to have finger pain.  She is seeing a rheumatologist and sounds like she has undergone fairly extensive workup but this was at [facility] and I do not have the records.  She was told that she has severe Raynaud's and was put on amlodipine .  She has not seen much relief and has necessitated finger amputations.  Past Medical History:  Diagnosis Date   Anxiety    History of cervical dysplasia    20 yrs ago    Hypertension    Hypothyroidism    PTSD (post-traumatic stress disorder)    Raynaud disease    Sarcoma (HCC)    Vasculopathy    Family History  Problem Relation Age of Onset   Hypertension Mother    Breast cancer Mother    Diabetes Father    Hypertension Father    Breast cancer Paternal Grandmother    Past Surgical History:  Procedure Laterality Date   AMPUTATION Left 05/24/2023   Procedure: revision amputation left small finger;  Surgeon: Murrell Drivers, MD;  Location: [facility];  Service: Orthopedics;  Laterality: Left;  time need 45 minutes   AMPUTATION Bilateral 07/16/2023   Procedure: revision amputation with incision and drainage left ring finger and right index finger;  Surgeon: Murrell Drivers, MD;  Location: [facility];  Service: Orthopedics;  Laterality: Bilateral;   AMPUTATION Left 09/07/2023   Procedure: AMPUTATION DIGIT LEFT THUMB;  Surgeon:  Murrell Drivers, MD;  Location: [facility];  Service: Orthopedics;  Laterality: Left;   AMPUTATION FINGER Right 09/27/2023   Procedure: AMPUTATION RIGHT INDEX FINGER;  Surgeon: Murrell Drivers, MD;  Location: [facility];  Service: Orthopedics;  Laterality: Right;  PARTIAL AMPUTATION LEFT INDEX FINGER   AMPUTATION FINGER Right 12/13/2023   Procedure: AMPUTATION, RIGHT RING FINGER;  Surgeon: Murrell Drivers, MD;  Location: [facility];  Service: Orthopedics;  Laterality: Right;   AMPUTATION FINGER Left 02/22/2024   Procedure: LEFT INDEX FINGER AMPUTATION THROUGH DISTAL PHALANX;  Surgeon: Murrell Drivers, MD;  Location: [facility];  Service: Orthopedics;  Laterality: Left;  LEFT INDEX FINGER AMPUTATION   AMPUTATION FINGER Left 04/04/2024   Procedure: AMPUTATION, FINGER;  Surgeon: Murrell Drivers, MD;  Location: [facility];  Service: Orthopedics;  Laterality: Left;   APPENDECTOMY     CESAREAN SECTION     INCISION AND DRAINAGE Bilateral 07/16/2023   Procedure: INCISION AND DRAINAGE OF LEFT RING AND RIGHT INDEX FINGERS;  Surgeon: Murrell Drivers, MD;  Location: [facility];  Service: Orthopedics;  Laterality: Bilateral;   LYMPH NODE BIOPSY      Short Social History:  Social History   Tobacco Use   Smoking status: Former    Current packs/day: 0.00    Types: Cigarettes    Quit date: 06/11/2023    Years since  quitting: 0.9   Smokeless tobacco: Never  Substance Use Topics   Alcohol use: Not Currently    No Known Allergies  Current Outpatient Medications  Medication Sig Dispense Refill   amLODipine  (NORVASC ) 2.5 MG tablet Take 2.5 mg by mouth daily.     oxyCODONE -acetaminophen  (PERCOCET) 5-325 MG tablet Take 1 tablet by mouth every 6 (six) hours as needed for severe pain (pain score 7-10). 15 tablet 0   No current facility-administered medications for this visit.    REVIEW OF SYSTEMS  All other systems were reviewed and are  negative     Objective:  Objective   There were no vitals filed for this visit. There is no height or weight on file to calculate BMI.  Physical Exam General: no acute distress Cardiac: hemodynamically stable Extremities: Multiple healed distal finger amputations, thickened darkened skin throughout fingers. Vascular:   Right: Palpable radial, ulnar, brachial  Left: Palpable radial, ulnar, brachial  Data: Extremity Doppler  +---------------+----------+---------+--------+--------+  Site          PSV (cm/s)Waveform StenosisComments  +---------------+----------+---------+--------+--------+  Subclavian Prox100       triphasic                  +---------------+----------+---------+--------+--------+  Subclavian Mid 77        triphasic                  +---------------+----------+---------+--------+--------+  Subclavian Dist67        triphasic                  +---------------+----------+---------+--------+--------+  Axillary      64        triphasic                  +---------------+----------+---------+--------+--------+  Brachial Prox  75        triphasic                  +---------------+----------+---------+--------+--------+  Brachial Mid   89        triphasic                  +---------------+----------+---------+--------+--------+  Brachial Dist  77        triphasic                  +---------------+----------+---------+--------+--------+  Radial Prox    46        triphasic                  +---------------+----------+---------+--------+--------+  Radial Mid     43        triphasic                  +---------------+----------+---------+--------+--------+  Radial Dist    40        triphasic                  +---------------+----------+---------+--------+--------+  Ulnar Prox     44        triphasic                  +---------------+----------+---------+--------+--------+  Ulnar Mid      46         triphasic                  +---------------+----------+---------+--------+--------+  Ulnar Dist     28        triphasic                  +---------------+----------+---------+--------+--------+  Left Doppler Findings:  +---------------+----------+---------+--------+--------+  Site          PSV (cm/s)Waveform StenosisComments  +---------------+----------+---------+--------+--------+  Subclavian Prox119       triphasic                  +---------------+----------+---------+--------+--------+  Subclavian Mid 87        triphasic                  +---------------+----------+---------+--------+--------+  Subclavian Dist73        triphasic                  +---------------+----------+---------+--------+--------+  Axillary      63        triphasic                  +---------------+----------+---------+--------+--------+  Brachial Prox  86        triphasic                  +---------------+----------+---------+--------+--------+  Brachial Mid   79        triphasic                  +---------------+----------+---------+--------+--------+  Brachial Dist  70        triphasic                  +---------------+----------+---------+--------+--------+  Radial Prox    56        triphasic                  +---------------+----------+---------+--------+--------+  Radial Mid     52        triphasic                  +---------------+----------+---------+--------+--------+  Radial Dist    57        triphasic                  +---------------+----------+---------+--------+--------+  Ulnar Prox     72        triphasic                  +---------------+----------+---------+--------+--------+  Ulnar Mid      67        triphasic                  +---------------+----------+---------+--------+--------+  Ulnar Dist     60        triphasic                  +---------------+----------+---------+--------+--------+       Assessment/Plan:   [name] is a 47 y.o. female with finger pain, ulceration who has undergone multiple amputations. I explained within normal pulses and vascular studies that do not demonstrate any flow-limiting stenosis that she likely has microvascular disease but but given her severity necessitating finger amputations I offered angiogram of bilateral upper extremities in order to fully rule out any intervenable stenosis in the chest or arms.  We discussed the risks and benefits and I explained that we would plan for a right groin access. She elected to proceed. 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 The patient is on best medical therapy for peripheral arterial disease. The patient has been counseled about the risks of tobacco use in atherosclerotic disease. The  patient has been counseled to abstain from any tobacco use. An aortogram with bilateral upper extremity runoff angiography and Bilateral upper extremity intervention and is indicated to better evaluate the patient's lower extremity circulation because of the limb and digit threatening nature of the patient's diagnosis. Based on the patient's clinical exam and non-invasive data, we anticipate an endovascular intervention in the aortic arch and upper extremity vessels.  Plan for bilateral upper extremity angiogram from right groin access  Norman GORMAN Serve MD [facility]

## 2024-05-30 ENCOUNTER — Ambulatory Visit (HOSPITAL_COMMUNITY)
Admission: RE | Admit: 2024-05-30 | Discharge: 2024-05-30 | Disposition: A | Discharge status: Home / Self Care | Payer: MEDICAID | Source: Ambulatory Visit | Attending: Vascular Surgery | Admitting: Vascular Surgery

## 2024-05-30 ENCOUNTER — Other Ambulatory Visit: Payer: Self-pay

## 2024-05-30 ENCOUNTER — Encounter: Payer: Self-pay | Admitting: Vascular Surgery

## 2024-05-30 ENCOUNTER — Ambulatory Visit (INDEPENDENT_AMBULATORY_CARE_PROVIDER_SITE_OTHER): Payer: MEDICAID | Admitting: Vascular Surgery

## 2024-05-30 VITALS — BP 153/95 | HR 78 | Temp 98.1°F | Resp 16 | Ht 63.0 in | Wt 118.8 lb

## 2024-05-30 DIAGNOSIS — M79603 Pain in arm, unspecified: Secondary | ICD-10-CM | POA: Insufficient documentation

## 2024-05-30 DIAGNOSIS — S61209A Unspecified open wound of unspecified finger without damage to nail, initial encounter: Secondary | ICD-10-CM

## 2024-05-30 MED ORDER — NITROGLYCERIN 2 % TD OINT: 0.5000 [in_us] | TOPICAL_OINTMENT | Freq: Four times a day (QID) | TRANSDERMAL | 0 refills | Status: AC

## 2024-06-03 ENCOUNTER — Telehealth: Payer: Self-pay

## 2024-06-03 NOTE — Telephone Encounter (Signed)
 Patient has called office repeatedly in order to obtain prior authorization since her nitroglycerin  cream will be $50 out of pocket.  Patient claims to have had a conversation with a nurse but is unsure of who she has spoken to (patient listed several names but none that are relevant to this office).    This nurse informed patient that we have documentation of her visit and that Dr Pearline has called in nitroglycerin  cream to her pharmacy.  There are no telephone notes from anyone in the chart.  Other office nurses claim to not have spoken to patient.  No documents for prior authorization in fax queue.    Patient may need to speak to someone in social work for medication payment assistance.

## 2024-06-09 ENCOUNTER — Ambulatory Visit (HOSPITAL_COMMUNITY)
Admission: RE | Admit: 2024-06-09 | Discharge: 2024-06-09 | Disposition: A | Discharge status: Home / Self Care | Payer: MEDICAID | Attending: Vascular Surgery | Admitting: Vascular Surgery

## 2024-06-09 ENCOUNTER — Encounter (HOSPITAL_COMMUNITY)
Admission: RE | Disposition: A | Discharge status: Home / Self Care | Payer: Self-pay | Source: Home / Self Care | Attending: Vascular Surgery

## 2024-06-09 ENCOUNTER — Other Ambulatory Visit: Payer: Self-pay

## 2024-06-09 DIAGNOSIS — S61209A Unspecified open wound of unspecified finger without damage to nail, initial encounter: Secondary | ICD-10-CM

## 2024-06-09 HISTORY — PX: ABDOMINAL AORTOGRAM W/LOWER EXTREMITY: CATH118223

## 2024-06-09 HISTORY — PX: UPPER EXTREMITY INTERVENTION: CATH118271

## 2024-06-09 LAB — POCT I-STAT, CHEM 8
BUN: 20 mg/dL (ref 6–20)
Calcium, Ion: 1.24 mmol/L (ref 1.15–1.40)
Chloride: 102 mmol/L (ref 98–111)
Creatinine, Ser: 0.7 mg/dL (ref 0.44–1.00)
Glucose, Bld: 100 mg/dL — ABNORMAL HIGH (ref 70–99)
HCT: 39 % (ref 36.0–46.0)
Hemoglobin: 13.3 g/dL (ref 12.0–15.0)
Potassium: 4.2 mmol/L (ref 3.5–5.1)
Sodium: 138 mmol/L (ref 135–145)
TCO2: 29 mmol/L (ref 22–32)

## 2024-06-09 SURGERY — ABDOMINAL AORTOGRAM W/LOWER EXTREMITY
Anesthesia: LOCAL

## 2024-06-09 MED ORDER — HEPARIN SODIUM (PORCINE) 1000 UNIT/ML IJ SOLN
INTRAMUSCULAR | Status: AC
  Filled 2024-06-09: qty 10

## 2024-06-09 MED ORDER — SODIUM CHLORIDE 0.9 % IV SOLN: INTRAVENOUS | Status: DC

## 2024-06-09 MED ORDER — ACETAMINOPHEN 325 MG PO TABS: 650.0000 mg | ORAL_TABLET | ORAL | Status: DC | PRN

## 2024-06-09 MED ORDER — HYDRALAZINE HCL 20 MG/ML IJ SOLN: 5.0000 mg | INTRAMUSCULAR | Status: DC | PRN

## 2024-06-09 MED ORDER — SODIUM CHLORIDE 0.9% FLUSH: 3.0000 mL | INTRAVENOUS | Status: DC | PRN

## 2024-06-09 MED ORDER — LIDOCAINE HCL (PF) 1 % IJ SOLN
INTRAMUSCULAR | Status: AC
  Filled 2024-06-09: qty 30

## 2024-06-09 MED ORDER — LIDOCAINE HCL (PF) 1 % IJ SOLN
INTRAMUSCULAR | Status: DC | PRN
  Administered 2024-06-09: 5 mL

## 2024-06-09 MED ORDER — MIDAZOLAM HCL 2 MG/2ML IJ SOLN
INTRAMUSCULAR | Status: AC
  Filled 2024-06-09: qty 2

## 2024-06-09 MED ORDER — LABETALOL HCL 5 MG/ML IV SOLN: 10.0000 mg | INTRAVENOUS | Status: DC | PRN

## 2024-06-09 MED ORDER — IODIXANOL 320 MG/ML IV SOLN
INTRAVENOUS | Status: DC | PRN
  Administered 2024-06-09: 50 mL

## 2024-06-09 MED ORDER — SODIUM CHLORIDE 0.9% FLUSH: 3.0000 mL | Freq: Two times a day (BID) | INTRAVENOUS | Status: DC

## 2024-06-09 MED ORDER — SODIUM CHLORIDE 0.9 % IV SOLN: 250.0000 mL | INTRAVENOUS | Status: DC | PRN

## 2024-06-09 MED ORDER — MIDAZOLAM HCL (PF) 2 MG/2ML IJ SOLN
INTRAMUSCULAR | Status: DC | PRN
  Administered 2024-06-09: 1 mg via INTRAVENOUS

## 2024-06-09 MED ORDER — HEPARIN (PORCINE) IN NACL 1000-0.9 UT/500ML-% IV SOLN
INTRAVENOUS | Status: DC | PRN
  Administered 2024-06-09: 1000 mL

## 2024-06-09 MED ORDER — HEPARIN SODIUM (PORCINE) 1000 UNIT/ML IJ SOLN
INTRAMUSCULAR | Status: DC | PRN
  Administered 2024-06-09: 5000 [IU] via INTRAVENOUS

## 2024-06-09 MED ORDER — SODIUM CHLORIDE 0.9 % WEIGHT BASED INFUSION: 1.0000 mL/kg/h | INTRAVENOUS | Status: DC

## 2024-06-09 MED ORDER — ONDANSETRON HCL 4 MG/2ML IJ SOLN: 4.0000 mg | Freq: Four times a day (QID) | INTRAMUSCULAR | Status: DC | PRN

## 2024-06-09 MED ORDER — FENTANYL CITRATE (PF) 100 MCG/2ML IJ SOLN
INTRAMUSCULAR | Status: AC
  Filled 2024-06-09: qty 2

## 2024-06-09 MED ORDER — FENTANYL CITRATE (PF) 100 MCG/2ML IJ SOLN
INTRAMUSCULAR | Status: DC | PRN
  Administered 2024-06-09 (×2): 25 ug via INTRAVENOUS

## 2024-06-09 SURGICAL SUPPLY — 15 items
CATH ANGIO 5F BER2 100CM (CATHETERS) IMPLANT
CATH ANGIO 5F PIGTAIL 100CM (CATHETERS) IMPLANT
CATH CXI SUPP 2.6F 150 ST (CATHETERS) IMPLANT
CATH OMNI FLUSH 5F 65CM (CATHETERS) IMPLANT
CLOSURE MYNX CONTROL 5F (Vascular Products) IMPLANT
GLIDEWIRE ADV .035X260CM (WIRE) IMPLANT
KIT MICROPUNCTURE NIT STIFF (SHEATH) IMPLANT
KIT SINGLE USE MANIFOLD (KITS) IMPLANT
KIT SYRINGE INJ CVI SPIKEX1 (MISCELLANEOUS) IMPLANT
SET ATX-X65L (MISCELLANEOUS) IMPLANT
SHEATH PINNACLE 5F 10CM (SHEATH) IMPLANT
SHEATH PROBE COVER 6X72 (BAG) IMPLANT
TRAY PV CATH (CUSTOM PROCEDURE TRAY) ×1 IMPLANT
WIRE BENTSON .035X145CM (WIRE) IMPLANT
WIRE HI TORQ COMMND ES.014X300 (WIRE) IMPLANT

## 2024-06-09 NOTE — Op Note (Signed)
    Patient name: ZAYNAB CHIPMAN MRN: [id number] DOB: 05-11-1976 Sex: female  06/09/2024 Pre-operative Diagnosis: Severe Raynaud's with finger amputations Post-operative diagnosis:  Same Surgeon:  Norman GORMAN Serve, MD Procedure Performed:  Ultrasound-guided access of right common femoral artery Arch aortogram and bilateral upper extremity angiogram Third-order cannulation of the right axillary artery Third order cannulation of the left ulnar artery Mynx closure of right common femoral artery 34 minutes of moderate sedation with fentanyl  and Versed   Indications: Mrs. Yoshida is a 48 year old female who have seen over the past 12 months with ongoing finger pain and ulcerations.  She has now undergone multiple fingertip amputations with Dr. Murrell.  Vascular studies demonstrated normal triphasic flow throughout the arm and wrist but given her severe symptoms necessitating amputation we discussed the risks and benefits of angiogram to fully assess her perfusion.  Risks and benefits were reviewed and she elected to proceed.  Findings:  Type I aortic arch, free from disease.  The great vessels of the arch are widely patent. Bilateral subclavian, axillary, brachial, radial and ulnar arteries are patent without flow-limiting stenosis to the wrist. At the base of the hand bilateral radial and ulnar arteries occluded and filled artery through small collaterals. The arch appears intact on the right but not intact on the left.   Procedure:  The patient was identified in the holding area and taken to the cath lab  The patient was then placed supine on the table and prepped and draped in the usual sterile fashion.  A time out was called.  Ultrasound was used to evaluate the right common femoral artery.  It was patent .  A digital ultrasound image was acquired.  A micropuncture needle was used to access the right common femoral artery under ultrasound guidance.  An 018 wire was advanced without resistance  and a micropuncture sheath was placed.  The 018 wire was removed and a benson wire was placed.  The micropuncture sheath was exchanged for a 5 french sheath.  The patient was then systemically heparinized and a pigtail catheter was then placed into the ascending aorta and arch aortogram was obtained.  Using a glide advantage wire and a 100 cm bare catheter the innominate artery was selected and wire was navigated into the right arm.  The catheter was placed into the right axillary artery and a right upper extremity angiogram was obtained.  The catheter was then pulled back into the aorta and the left subclavian artery was selected.  The wire was passed into the left arm and the catheter was placed into the left axillary artery and a left upper extremity angiogram was obtained with the above findings.  Given these findings and attempted to cross the left ulnar occlusion.  A 014 command wire and a CXI catheter were used.  I very gently attempted to cross the occlusion of but the wire would not only find collaterals and J at the site of the occlusion.  I concluded that these blockages are not amenable to endovascular revascularization.  The wire and catheter were removed at the left groin access was closed with a Mynx closure device.  Contrast: 50 cc Sedation: 34 minutes   Norman GORMAN Serve MD [facility] [place] Office: [phone]

## 2024-06-09 NOTE — Progress Notes (Signed)
Client up and walked and tolerated well; right groin stable, no bleeding or hematoma 

## 2024-06-09 NOTE — Interval H&P Note (Signed)
 History and Physical Interval Note:  06/09/2024 8:09 AM  [name]  has presented today for surgery, with the diagnosis of finger ulceration.  The various methods of treatment have been discussed with the patient and family. After consideration of risks, benefits and other options for treatment, the patient has consented to  Procedure(s): ABDOMINAL AORTOGRAM W/LOWER EXTREMITY (N/A) UPPER EXTREMITY INTERVENTION (N/A) as a surgical intervention.  The patient's history has been reviewed, patient examined, no change in status, stable for surgery.  I have reviewed the patient's chart and labs.  Questions were answered to the patient's satisfaction.     Norman GORMAN Serve

## 2024-06-10 ENCOUNTER — Encounter (HOSPITAL_COMMUNITY): Payer: Self-pay | Admitting: Vascular Surgery
